# Patient Record
Sex: Female | Born: 1960 | Race: White | Hispanic: No | Marital: Single | Smoking: Current every day smoker
Health system: Southern US, Community
[De-identification: ages and names within clinical notes are randomized; demographics above are authoritative.]

## PROBLEM LIST (undated history)

## (undated) DIAGNOSIS — R531 Weakness: Secondary | ICD-10-CM

## (undated) DIAGNOSIS — K219 Gastro-esophageal reflux disease without esophagitis: Secondary | ICD-10-CM

## (undated) DIAGNOSIS — E079 Disorder of thyroid, unspecified: Secondary | ICD-10-CM

## (undated) DIAGNOSIS — I10 Essential (primary) hypertension: Secondary | ICD-10-CM

## (undated) DIAGNOSIS — F32A Depression, unspecified: Secondary | ICD-10-CM

## (undated) DIAGNOSIS — Z8639 Personal history of other endocrine, nutritional and metabolic disease: Secondary | ICD-10-CM

## (undated) DIAGNOSIS — G5601 Carpal tunnel syndrome, right upper limb: Secondary | ICD-10-CM

## (undated) DIAGNOSIS — G510 Bell's palsy: Secondary | ICD-10-CM

## (undated) DIAGNOSIS — R079 Chest pain, unspecified: Secondary | ICD-10-CM

## (undated) DIAGNOSIS — I160 Hypertensive urgency: Secondary | ICD-10-CM

## (undated) DIAGNOSIS — J209 Acute bronchitis, unspecified: Secondary | ICD-10-CM

## (undated) DIAGNOSIS — M1990 Unspecified osteoarthritis, unspecified site: Secondary | ICD-10-CM

## (undated) DIAGNOSIS — E042 Nontoxic multinodular goiter: Secondary | ICD-10-CM

## (undated) DIAGNOSIS — R002 Palpitations: Secondary | ICD-10-CM

## (undated) DIAGNOSIS — Z720 Tobacco use: Secondary | ICD-10-CM

## (undated) DIAGNOSIS — E039 Hypothyroidism, unspecified: Secondary | ICD-10-CM

## (undated) DIAGNOSIS — Z98811 Dental restoration status: Secondary | ICD-10-CM

## (undated) DIAGNOSIS — F329 Major depressive disorder, single episode, unspecified: Secondary | ICD-10-CM

## (undated) DIAGNOSIS — E0590 Thyrotoxicosis, unspecified without thyrotoxic crisis or storm: Secondary | ICD-10-CM

## (undated) HISTORY — DX: Essential (primary) hypertension: I10

## (undated) HISTORY — PX: APPENDECTOMY: SHX54

## (undated) HISTORY — DX: Acute bronchitis, unspecified: J20.9

## (undated) HISTORY — PX: HERNIA REPAIR: SHX51

## (undated) HISTORY — DX: Chest pain, unspecified: R07.9

## (undated) HISTORY — DX: Nontoxic multinodular goiter: E04.2

## (undated) HISTORY — PX: KNEE ARTHROSCOPY: SUR90

## (undated) HISTORY — DX: Hypothyroidism, unspecified: E03.9

## (undated) HISTORY — DX: Thyrotoxicosis, unspecified without thyrotoxic crisis or storm: E05.90

## (undated) HISTORY — DX: Palpitations: R00.2

## (undated) HISTORY — PX: INGUINAL HERNIA REPAIR: SHX194

## (undated) HISTORY — DX: Hypertensive urgency: I16.0

## (undated) HISTORY — DX: Weakness: R53.1

## (undated) HISTORY — DX: Bell's palsy: G51.0

---

## 1998-12-25 ENCOUNTER — Other Ambulatory Visit: Admission: RE | Admit: 1998-12-25 | Discharge: 1998-12-25 | Payer: Self-pay | Admitting: *Deleted

## 1999-04-11 ENCOUNTER — Encounter: Admission: RE | Admit: 1999-04-11 | Discharge: 1999-04-11 | Payer: Self-pay | Admitting: Gastroenterology

## 1999-04-11 ENCOUNTER — Encounter: Payer: Self-pay | Admitting: Gastroenterology

## 2001-05-01 ENCOUNTER — Encounter: Payer: Self-pay | Admitting: *Deleted

## 2001-05-01 ENCOUNTER — Emergency Department (HOSPITAL_COMMUNITY): Admission: EM | Admit: 2001-05-01 | Discharge: 2001-05-01 | Payer: Self-pay | Admitting: *Deleted

## 2001-06-24 ENCOUNTER — Ambulatory Visit (HOSPITAL_COMMUNITY): Admission: RE | Admit: 2001-06-24 | Discharge: 2001-06-24 | Payer: Self-pay | Admitting: Gastroenterology

## 2001-12-01 ENCOUNTER — Emergency Department (HOSPITAL_COMMUNITY): Admission: EM | Admit: 2001-12-01 | Discharge: 2001-12-01 | Payer: Self-pay | Admitting: Emergency Medicine

## 2001-12-13 ENCOUNTER — Other Ambulatory Visit: Admission: RE | Admit: 2001-12-13 | Discharge: 2001-12-13 | Payer: Self-pay | Admitting: Obstetrics and Gynecology

## 2003-04-20 ENCOUNTER — Other Ambulatory Visit: Admission: RE | Admit: 2003-04-20 | Discharge: 2003-04-20 | Payer: Self-pay | Admitting: Obstetrics and Gynecology

## 2004-05-01 ENCOUNTER — Ambulatory Visit: Payer: Self-pay | Admitting: Hematology & Oncology

## 2004-07-24 ENCOUNTER — Ambulatory Visit: Payer: Self-pay | Admitting: Hematology & Oncology

## 2005-05-14 ENCOUNTER — Encounter: Admission: RE | Admit: 2005-05-14 | Discharge: 2005-05-14 | Payer: Self-pay | Admitting: Family Medicine

## 2006-01-27 ENCOUNTER — Emergency Department (HOSPITAL_COMMUNITY): Admission: EM | Admit: 2006-01-27 | Discharge: 2006-01-27 | Payer: Self-pay | Admitting: Emergency Medicine

## 2007-07-07 ENCOUNTER — Emergency Department (HOSPITAL_COMMUNITY): Admission: EM | Admit: 2007-07-07 | Discharge: 2007-07-07 | Payer: Self-pay | Admitting: Emergency Medicine

## 2007-08-22 ENCOUNTER — Emergency Department (HOSPITAL_COMMUNITY): Admission: EM | Admit: 2007-08-22 | Discharge: 2007-08-22 | Payer: Self-pay | Admitting: Emergency Medicine

## 2007-08-23 ENCOUNTER — Encounter: Payer: Self-pay | Admitting: Endocrinology

## 2007-09-05 ENCOUNTER — Ambulatory Visit: Payer: Self-pay | Admitting: Endocrinology

## 2007-09-05 DIAGNOSIS — E0590 Thyrotoxicosis, unspecified without thyrotoxic crisis or storm: Secondary | ICD-10-CM

## 2007-09-05 DIAGNOSIS — E042 Nontoxic multinodular goiter: Secondary | ICD-10-CM

## 2007-09-05 HISTORY — DX: Nontoxic multinodular goiter: E04.2

## 2007-09-05 HISTORY — DX: Thyrotoxicosis, unspecified without thyrotoxic crisis or storm: E05.90

## 2007-09-19 ENCOUNTER — Encounter: Admission: RE | Admit: 2007-09-19 | Discharge: 2007-09-19 | Payer: Self-pay | Admitting: Endocrinology

## 2008-01-09 ENCOUNTER — Ambulatory Visit (HOSPITAL_COMMUNITY): Admission: RE | Admit: 2008-01-09 | Discharge: 2008-01-09 | Payer: Self-pay | Admitting: Internal Medicine

## 2008-08-14 ENCOUNTER — Encounter: Admission: RE | Admit: 2008-08-14 | Discharge: 2008-08-14 | Payer: Self-pay | Admitting: Orthopedic Surgery

## 2009-07-08 ENCOUNTER — Encounter (INDEPENDENT_AMBULATORY_CARE_PROVIDER_SITE_OTHER): Payer: Self-pay | Admitting: Emergency Medicine

## 2009-07-08 ENCOUNTER — Observation Stay (HOSPITAL_COMMUNITY): Admission: EM | Admit: 2009-07-08 | Discharge: 2009-07-08 | Payer: Self-pay | Admitting: Emergency Medicine

## 2009-07-08 ENCOUNTER — Ambulatory Visit: Payer: Self-pay | Admitting: Internal Medicine

## 2009-08-28 ENCOUNTER — Emergency Department (HOSPITAL_COMMUNITY): Admission: EM | Admit: 2009-08-28 | Discharge: 2009-08-28 | Payer: Self-pay | Admitting: Emergency Medicine

## 2010-03-16 ENCOUNTER — Encounter: Payer: Self-pay | Admitting: Internal Medicine

## 2010-05-11 LAB — BASIC METABOLIC PANEL
BUN: 12 mg/dL (ref 6–23)
Calcium: 8.9 mg/dL (ref 8.4–10.5)
Chloride: 105 mEq/L (ref 96–112)
Creatinine, Ser: 0.87 mg/dL (ref 0.4–1.2)
GFR calc non Af Amer: >60 mL/min (ref >60)

## 2010-05-11 LAB — CBC
MCV: 92.5 fL (ref 78.0–100.0)
Platelets: 332 10*3/uL (ref 150–400)
RDW: 13.9 % (ref 11.5–15.5)
WBC: 13.5 10*3/uL — ABNORMAL HIGH (ref 4.0–10.5)

## 2010-05-11 LAB — DIFFERENTIAL
Basophils Absolute: 0 10*3/uL (ref 0.0–0.1)
Eosinophils Relative: 1 % (ref 0–5)
Lymphocytes Relative: 4 % — ABNORMAL LOW (ref 12–46)

## 2010-05-11 LAB — POCT I-STAT, CHEM 8
BUN: 13 mg/dL (ref 6–23)
Hemoglobin: 15.6 g/dL — ABNORMAL HIGH (ref 12.0–15.0)
Sodium: 139 mEq/L (ref 135–145)
TCO2: 23 mmol/L (ref 0–100)

## 2010-05-11 LAB — POCT CARDIAC MARKERS: Myoglobin, poc: 89 ng/mL (ref 12–200)

## 2010-05-11 LAB — GLUCOSE, CAPILLARY: Glucose-Capillary: 109 mg/dL — ABNORMAL HIGH (ref 70–99)

## 2010-05-12 LAB — URINALYSIS, ROUTINE W REFLEX MICROSCOPIC
Bilirubin Urine: NEGATIVE
Hgb urine dipstick: NEGATIVE
Ketones, ur: NEGATIVE mg/dL
Nitrite: POSITIVE — AB
pH: 6 (ref 5.0–8.0)

## 2010-05-12 LAB — COMPREHENSIVE METABOLIC PANEL
ALT: 17 U/L (ref 0–35)
Alkaline Phosphatase: 65 U/L (ref 39–117)
CO2: 22 mEq/L (ref 19–32)
GFR calc non Af Amer: >60 mL/min (ref >60)
Glucose, Bld: 110 mg/dL — ABNORMAL HIGH (ref 70–99)
Potassium: 3.1 mEq/L — ABNORMAL LOW (ref 3.5–5.1)
Sodium: 135 mEq/L (ref 135–145)

## 2010-05-12 LAB — CBC
HCT: 40.9 % (ref 36.0–46.0)
Hemoglobin: 14.3 g/dL (ref 12.0–15.0)
MCHC: 34.9 g/dL (ref 30.0–36.0)
MCV: 91.8 fL (ref 78.0–100.0)
Platelets: 396 K/uL (ref 150–400)
RBC: 4.46 MIL/uL (ref 3.87–5.11)
RDW: 13.9 % (ref 11.5–15.5)
WBC: 10.9 K/uL — ABNORMAL HIGH (ref 4.0–10.5)

## 2010-05-12 LAB — DIFFERENTIAL
Basophils Relative: 1 % (ref 0–1)
Eosinophils Absolute: 0.3 10*3/uL (ref 0.0–0.7)
Eosinophils Relative: 3 % (ref 0–5)
Monocytes Relative: 6 % (ref 3–12)
Neutrophils Relative %: 66 % (ref 43–77)

## 2010-05-12 LAB — URINE CULTURE
Colony Count: NO GROWTH
Culture: NO GROWTH

## 2010-05-12 LAB — TROPONIN I
Troponin I: 0.01 ng/mL (ref 0.00–0.06)
Troponin I: 0.04 ng/mL (ref 0.00–0.06)

## 2010-05-12 LAB — CK TOTAL AND CKMB (NOT AT ARMC)
CK, MB: 1.1 ng/mL (ref 0.3–4.0)
Relative Index: INVALID (ref 0.0–2.5)
Total CK: 43 U/L (ref 7–177)
Total CK: 63 U/L (ref 7–177)

## 2010-05-12 LAB — LIPASE, BLOOD: Lipase: 31 U/L (ref 11–59)

## 2010-05-12 LAB — URINE MICROSCOPIC-ADD ON

## 2010-07-11 NOTE — Procedures (Signed)
[place]  Patient:    Sheri Villarreal, Sheri Villarreal Visit Number: [id number] MRN: [id number]          Service Type: END Location: ENDO Attending Physician:  Nelda Marseille Dictated by:   Petra Kuba, M.D. Proc. Date: 06/24/01 Admit Date:  06/24/2001 Discharge Date: 06/24/2001   CC:         Jamesetta Geralds, M.D.   Procedure Report  PROCEDURE:  Esophagogastroduodenoscopy.  INDICATION:  Upper tract symptoms.  Consent was signed after risks, benefits, methods, and options thoroughly discussed in the office.  MEDICATIONS:  Demerol 60 mg, Versed 7.5 mg.  DESCRIPTION OF PROCEDURE:  The video endoscope was inserted by direct vision. The esophagus was normal.  In the distal esophagus was a tiny, small hiatal hernia.  The scope passed into the stomach and advanced through a normal antrum and a normal pylorus into a normal duodenal bulb and around the C-loop to a normal second portion of the duodenum.  The scope was withdrawn back to the bulb, and a good look there ruled out ulcers in that location.  The scope was withdrawn back to the stomach and retroflexed.  Cardia, fundus, angularis, lesser and greater curve were normal.  The scope was straightened.  Straight visualization of the stomach was normal.  The scope was then slowly withdrawn. Again a good look at the esophagus was normal without signs of Barretts or esophagitis.  The patient tolerated the procedure well.  There was no obvious immediate complication.  ENDOSCOPIC DIAGNOSES: 1. Tiny to small hiatal hernia. 2. Otherwise within normal limits EGD.  PLAN: 1. Continue Nexium since that seems to be helping. 2. Follow up p.r.n. or in two three months to recheck symptoms and make sure    no other plans are needed. Dictated by:   Petra Kuba, M.D. Attending Physician:  Nelda Marseille DD:  06/24/01 TD:  06/27/01 Job: [id number]

## 2010-11-19 LAB — D-DIMER, QUANTITATIVE: D-Dimer, Quant: 0.36

## 2010-11-19 LAB — POCT I-STAT, CHEM 8
BUN: 6
Calcium, Ion: 1.15
Creatinine, Ser: 0.6
Glucose, Bld: 94
Sodium: 140
TCO2: 26

## 2010-11-19 LAB — POCT CARDIAC MARKERS: Myoglobin, poc: 56

## 2010-11-20 LAB — COMPREHENSIVE METABOLIC PANEL
Albumin: 3.3 — ABNORMAL LOW
BUN: 6
Creatinine, Ser: 0.56
Potassium: 3.2 — ABNORMAL LOW
Total Protein: 6.2

## 2010-11-20 LAB — POCT CARDIAC MARKERS
CKMB, poc: <1 — ABNORMAL LOW
Myoglobin, poc: 51.7
Myoglobin, poc: 56.7
Operator id: 277751
Troponin i, poc: <0.05

## 2010-11-20 LAB — TSH: TSH: 0.008 — ABNORMAL LOW

## 2010-11-20 LAB — CBC
HCT: 38.7
Platelets: 367
RDW: 14.4

## 2010-11-20 LAB — DIFFERENTIAL
Lymphocytes Relative: 11 — ABNORMAL LOW
Monocytes Absolute: 0.9
Monocytes Relative: 6
Neutro Abs: 11.2 — ABNORMAL HIGH

## 2010-11-20 LAB — MAGNESIUM: Magnesium: 1.8

## 2013-06-09 ENCOUNTER — Other Ambulatory Visit: Payer: Self-pay | Admitting: Orthopedic Surgery

## 2013-06-23 DIAGNOSIS — G5601 Carpal tunnel syndrome, right upper limb: Secondary | ICD-10-CM

## 2013-06-23 HISTORY — DX: Carpal tunnel syndrome, right upper limb: G56.01

## 2013-06-30 ENCOUNTER — Encounter (HOSPITAL_BASED_OUTPATIENT_CLINIC_OR_DEPARTMENT_OTHER): Payer: Self-pay | Admitting: *Deleted

## 2013-06-30 ENCOUNTER — Encounter (HOSPITAL_BASED_OUTPATIENT_CLINIC_OR_DEPARTMENT_OTHER)
Admission: RE | Admit: 2013-06-30 | Discharge: 2013-06-30 | Disposition: A | Discharge status: Home / Self Care | Payer: 59 | Source: Ambulatory Visit | Attending: Orthopedic Surgery | Admitting: Orthopedic Surgery

## 2013-06-30 LAB — BASIC METABOLIC PANEL
BUN: 16 mg/dL (ref 6–23)
CALCIUM: 8.7 mg/dL (ref 8.4–10.5)
CO2: 24 mEq/L (ref 19–32)
Chloride: 102 mEq/L (ref 96–112)
Creatinine, Ser: 0.8 mg/dL (ref 0.50–1.10)
GFR calc non Af Amer: 82 mL/min — ABNORMAL LOW (ref >90)
Glucose, Bld: 105 mg/dL — ABNORMAL HIGH (ref 70–99)
Potassium: 3.8 mEq/L (ref 3.7–5.3)
SODIUM: 140 meq/L (ref 137–147)

## 2013-06-30 NOTE — Pre-Procedure Instructions (Addendum)
EKG req. from Dr. Drema Dallas' office To come for BMET

## 2013-07-04 ENCOUNTER — Encounter (HOSPITAL_BASED_OUTPATIENT_CLINIC_OR_DEPARTMENT_OTHER)
Admission: RE | Disposition: A | Discharge status: Home / Self Care | Payer: Self-pay | Source: Ambulatory Visit | Attending: Orthopedic Surgery

## 2013-07-04 ENCOUNTER — Ambulatory Visit (HOSPITAL_BASED_OUTPATIENT_CLINIC_OR_DEPARTMENT_OTHER)
Admission: RE | Admit: 2013-07-04 | Discharge: 2013-07-04 | Disposition: A | Discharge status: Home / Self Care | Payer: 59 | Source: Ambulatory Visit | Attending: Orthopedic Surgery | Admitting: Orthopedic Surgery

## 2013-07-04 ENCOUNTER — Ambulatory Visit (HOSPITAL_BASED_OUTPATIENT_CLINIC_OR_DEPARTMENT_OTHER): Payer: 59 | Admitting: Certified Registered"

## 2013-07-04 ENCOUNTER — Encounter (HOSPITAL_BASED_OUTPATIENT_CLINIC_OR_DEPARTMENT_OTHER): Payer: Self-pay | Admitting: Orthopedic Surgery

## 2013-07-04 ENCOUNTER — Encounter (HOSPITAL_BASED_OUTPATIENT_CLINIC_OR_DEPARTMENT_OTHER): Payer: 59 | Admitting: Certified Registered"

## 2013-07-04 DIAGNOSIS — M129 Arthropathy, unspecified: Secondary | ICD-10-CM | POA: Insufficient documentation

## 2013-07-04 DIAGNOSIS — G5600 Carpal tunnel syndrome, unspecified upper limb: Secondary | ICD-10-CM | POA: Insufficient documentation

## 2013-07-04 DIAGNOSIS — E0590 Thyrotoxicosis, unspecified without thyrotoxic crisis or storm: Secondary | ICD-10-CM | POA: Insufficient documentation

## 2013-07-04 DIAGNOSIS — I10 Essential (primary) hypertension: Secondary | ICD-10-CM | POA: Insufficient documentation

## 2013-07-04 DIAGNOSIS — F17200 Nicotine dependence, unspecified, uncomplicated: Secondary | ICD-10-CM | POA: Insufficient documentation

## 2013-07-04 DIAGNOSIS — Z6836 Body mass index (BMI) 36.0-36.9, adult: Secondary | ICD-10-CM | POA: Insufficient documentation

## 2013-07-04 DIAGNOSIS — Z79899 Other long term (current) drug therapy: Secondary | ICD-10-CM | POA: Insufficient documentation

## 2013-07-04 DIAGNOSIS — K219 Gastro-esophageal reflux disease without esophagitis: Secondary | ICD-10-CM | POA: Insufficient documentation

## 2013-07-04 HISTORY — DX: Carpal tunnel syndrome, right upper limb: G56.01

## 2013-07-04 HISTORY — PX: CARPAL TUNNEL RELEASE: SHX101

## 2013-07-04 HISTORY — DX: Essential (primary) hypertension: I10

## 2013-07-04 HISTORY — DX: Depression, unspecified: F32.A

## 2013-07-04 HISTORY — DX: Unspecified osteoarthritis, unspecified site: M19.90

## 2013-07-04 HISTORY — DX: Dental restoration status: Z98.811

## 2013-07-04 HISTORY — DX: Gastro-esophageal reflux disease without esophagitis: K21.9

## 2013-07-04 HISTORY — DX: Personal history of other endocrine, nutritional and metabolic disease: Z86.39

## 2013-07-04 HISTORY — DX: Major depressive disorder, single episode, unspecified: F32.9

## 2013-07-04 LAB — POCT HEMOGLOBIN-HEMACUE: Hemoglobin: 14.3 g/dL (ref 12.0–15.0)

## 2013-07-04 SURGERY — CARPAL TUNNEL RELEASE
Anesthesia: Monitor Anesthesia Care | Site: Wrist | Laterality: Right

## 2013-07-04 MED ORDER — LACTATED RINGERS IV SOLN
INTRAVENOUS | Status: DC
  Administered 2013-07-04: 09:00:00 via INTRAVENOUS

## 2013-07-04 MED ORDER — PROPOFOL INFUSION 10 MG/ML OPTIME
INTRAVENOUS | Status: DC | PRN
  Administered 2013-07-04: 75 ug/kg/min via INTRAVENOUS

## 2013-07-04 MED ORDER — FENTANYL CITRATE 0.05 MG/ML IJ SOLN
INTRAMUSCULAR | Status: AC
  Filled 2013-07-04: qty 4

## 2013-07-04 MED ORDER — CEFAZOLIN SODIUM-DEXTROSE 2-3 GM-% IV SOLR: 2.0000 g | INTRAVENOUS | Status: DC

## 2013-07-04 MED ORDER — BUPIVACAINE HCL (PF) 0.25 % IJ SOLN
INTRAMUSCULAR | Status: DC | PRN
  Administered 2013-07-04: 7 mL

## 2013-07-04 MED ORDER — LIDOCAINE HCL (CARDIAC) 20 MG/ML IV SOLN
INTRAVENOUS | Status: DC | PRN
  Administered 2013-07-04: 30 mg via INTRAVENOUS

## 2013-07-04 MED ORDER — CHLORHEXIDINE GLUCONATE 4 % EX LIQD: 60.0000 mL | Freq: Once | CUTANEOUS | Status: DC

## 2013-07-04 MED ORDER — HYDROCODONE-ACETAMINOPHEN 5-325 MG PO TABS: 1.0000 | ORAL_TABLET | Freq: Four times a day (QID) | ORAL | Status: DC | PRN

## 2013-07-04 MED ORDER — MIDAZOLAM HCL 2 MG/2ML IJ SOLN
INTRAMUSCULAR | Status: AC
  Filled 2013-07-04: qty 2

## 2013-07-04 MED ORDER — OXYCODONE HCL 5 MG PO TABS: 5.0000 mg | ORAL_TABLET | Freq: Once | ORAL | Status: DC | PRN

## 2013-07-04 MED ORDER — ONDANSETRON HCL 4 MG/2ML IJ SOLN
4.0000 mg | Freq: Once | INTRAMUSCULAR | Status: DC | PRN
Start: 2013-07-04 — End: 2013-07-04

## 2013-07-04 MED ORDER — MIDAZOLAM HCL 5 MG/5ML IJ SOLN
INTRAMUSCULAR | Status: DC | PRN
  Administered 2013-07-04: 2 mg via INTRAVENOUS

## 2013-07-04 MED ORDER — LIDOCAINE HCL (PF) 0.5 % IJ SOLN
INTRAMUSCULAR | Status: DC | PRN
  Administered 2013-07-04: 30 mL via INTRAVENOUS

## 2013-07-04 MED ORDER — HYDROMORPHONE HCL PF 1 MG/ML IJ SOLN
0.2500 mg | INTRAMUSCULAR | Status: DC | PRN
Start: 2013-07-04 — End: 2013-07-04

## 2013-07-04 MED ORDER — FENTANYL CITRATE 0.05 MG/ML IJ SOLN: 50.0000 ug | INTRAMUSCULAR | Status: DC | PRN

## 2013-07-04 MED ORDER — OXYCODONE HCL 5 MG/5ML PO SOLN: 5.0000 mg | Freq: Once | ORAL | Status: DC | PRN

## 2013-07-04 MED ORDER — MIDAZOLAM HCL 2 MG/2ML IJ SOLN: 1.0000 mg | INTRAMUSCULAR | Status: DC | PRN

## 2013-07-04 MED ORDER — CEFAZOLIN SODIUM-DEXTROSE 2-3 GM-% IV SOLR
2.0000 g | INTRAVENOUS | Status: AC
  Administered 2013-07-04: 2 g via INTRAVENOUS

## 2013-07-04 MED ORDER — FENTANYL CITRATE 0.05 MG/ML IJ SOLN
INTRAMUSCULAR | Status: DC | PRN
Start: 2013-07-04 — End: 2013-07-04
  Administered 2013-07-04: 50 ug via INTRAVENOUS

## 2013-07-04 MED ORDER — CEFAZOLIN SODIUM-DEXTROSE 2-3 GM-% IV SOLR
INTRAVENOUS | Status: AC
  Filled 2013-07-04: qty 50

## 2013-07-04 MED ORDER — ONDANSETRON HCL 4 MG/2ML IJ SOLN
INTRAMUSCULAR | Status: DC | PRN
  Administered 2013-07-04: 4 mg via INTRAVENOUS

## 2013-07-04 MED ORDER — MIDAZOLAM HCL 2 MG/ML PO SYRP: 12.0000 mg | ORAL_SOLUTION | Freq: Once | ORAL | Status: DC | PRN

## 2013-07-04 SURGICAL SUPPLY — 39 items
BLADE 15 SAFETY STRL DISP (BLADE) IMPLANT
BLADE SURG 15 STRL LF DISP TIS (BLADE) ×1 IMPLANT
BLADE SURG 15 STRL SS (BLADE) ×1
BNDG COHESIVE 3X5 TAN STRL LF (GAUZE/BANDAGES/DRESSINGS) ×2 IMPLANT
BNDG ESMARK 4X9 LF (GAUZE/BANDAGES/DRESSINGS) IMPLANT
BNDG GAUZE ELAST 4 BULKY (GAUZE/BANDAGES/DRESSINGS) ×2 IMPLANT
CHLORAPREP W/TINT 26ML (MISCELLANEOUS) ×2 IMPLANT
CORDS BIPOLAR (ELECTRODE) ×2 IMPLANT
COVER MAYO STAND STRL (DRAPES) ×2 IMPLANT
COVER TABLE BACK 60X90 (DRAPES) ×2 IMPLANT
CUFF TOURNIQUET SINGLE 18IN (TOURNIQUET CUFF) ×2 IMPLANT
DRAPE EXTREMITY T 121X128X90 (DRAPE) ×2 IMPLANT
DRAPE SURG 17X23 STRL (DRAPES) ×2 IMPLANT
DRSG PAD ABDOMINAL 8X10 ST (GAUZE/BANDAGES/DRESSINGS) ×2 IMPLANT
GAUZE SPONGE 4X4 12PLY STRL (GAUZE/BANDAGES/DRESSINGS) ×2 IMPLANT
GAUZE XEROFORM 1X8 LF (GAUZE/BANDAGES/DRESSINGS) ×2 IMPLANT
GLOVE BIO SURGEON STRL SZ7.5 (GLOVE) ×2 IMPLANT
GLOVE BIOGEL PI IND STRL 7.0 (GLOVE) ×1 IMPLANT
GLOVE BIOGEL PI IND STRL 8 (GLOVE) ×1 IMPLANT
GLOVE BIOGEL PI IND STRL 8.5 (GLOVE) ×1 IMPLANT
GLOVE BIOGEL PI INDICATOR 7.0 (GLOVE) ×1
GLOVE BIOGEL PI INDICATOR 8 (GLOVE) ×1
GLOVE BIOGEL PI INDICATOR 8.5 (GLOVE) ×1
GLOVE ECLIPSE 6.5 STRL STRAW (GLOVE) ×2 IMPLANT
GLOVE EXAM NITRILE EXT CUFF MD (GLOVE) ×2 IMPLANT
GLOVE SURG ORTHO 8.0 STRL STRW (GLOVE) ×2 IMPLANT
GOWN STRL REUS W/ TWL LRG LVL3 (GOWN DISPOSABLE) ×1 IMPLANT
GOWN STRL REUS W/TWL LRG LVL3 (GOWN DISPOSABLE) ×1
GOWN STRL REUS W/TWL XL LVL3 (GOWN DISPOSABLE) ×4 IMPLANT
NEEDLE 27GAX1X1/2 (NEEDLE) ×2 IMPLANT
NS IRRIG 1000ML POUR BTL (IV SOLUTION) ×2 IMPLANT
PACK BASIN DAY SURGERY FS (CUSTOM PROCEDURE TRAY) ×2 IMPLANT
STOCKINETTE 4X48 STRL (DRAPES) ×2 IMPLANT
SUT VICRYL 4-0 PS2 18IN ABS (SUTURE) IMPLANT
SUT VICRYL RAPIDE 4/0 PS 2 (SUTURE) ×2 IMPLANT
SYR BULB 3OZ (MISCELLANEOUS) ×2 IMPLANT
SYR CONTROL 10ML LL (SYRINGE) ×2 IMPLANT
TOWEL OR 17X24 6PK STRL BLUE (TOWEL DISPOSABLE) ×2 IMPLANT
UNDERPAD 30X30 INCONTINENT (UNDERPADS AND DIAPERS) ×2 IMPLANT

## 2013-07-04 NOTE — Brief Op Note (Signed)
07/04/2013  10:12 AM  PATIENT:  Sheri Villarreal  53 y.o. female  PRE-OPERATIVE DIAGNOSIS:  RIGHT CARPAL TUNNEL SYNDROME  POST-OPERATIVE DIAGNOSIS:  Right Carpal Tunnel Syndrome  PROCEDURE:  Procedure(s): RIGHT CARPAL TUNNEL RELEASE (Right)  SURGEON:  Surgeon(s) and Role:    * Wynonia Sours, MD - Primary    * Tennis Must, MD - Assisting  PHYSICIAN ASSISTANT:   ASSISTANTS: K Karey Suthers,MD   ANESTHESIA:   local and regional  EBL:  Total I/O In: 600 [I.V.:600] Out: -   BLOOD ADMINISTERED:none  DRAINS: none   LOCAL MEDICATIONS USED:  BUPIVICAINE   SPECIMEN:  No Specimen  DISPOSITION OF SPECIMEN:  N/A  COUNTS:  YES  TOURNIQUET:   Total Tourniquet Time Documented: Forearm (Right) - 18 minutes Total: Forearm (Right) - 18 minutes   DICTATION: .Other Dictation: Dictation Number [id number]  PLAN OF CARE: Discharge to home after PACU  PATIENT DISPOSITION:  PACU - hemodynamically stable.

## 2013-07-04 NOTE — Anesthesia Procedure Notes (Addendum)
Procedure Name: MAC Date/Time: 07/04/2013 9:35 AM Performed by: Everett Ricciardelli Pre-anesthesia Checklist: Patient identified, Emergency Drugs available, Suction available, Patient being monitored and Timeout performed Patient Re-evaluated:Patient Re-evaluated prior to inductionOxygen Delivery Method: Simple face mask   Anesthesia Regional Block:  Bier block (IV Regional)  Pre-Anesthetic Checklist: ,, timeout performed, Correct Patient, Correct Site, Correct Laterality, Correct Procedure,, site marked, surgical consent,, at surgeon's request Needles:  Injection technique: Single-shot  Needle Type: Other      Needle Gauge: 20 and 20 G    Additional Needles: Bier block (IV Regional) Narrative:   Performed by: Personally

## 2013-07-04 NOTE — Anesthesia Postprocedure Evaluation (Signed)
  Anesthesia Post-op Note  Patient: Sheri Villarreal  Procedure(s) Performed: Procedure(s): RIGHT CARPAL TUNNEL RELEASE (Right)  Patient Location: PACU  Anesthesia Type:MAC and Bier block  Level of Consciousness: awake, alert  and oriented  Airway and Oxygen Therapy: Patient Spontanous Breathing  Post-op Pain: none  Post-op Assessment: Post-op Vital signs reviewed  Post-op Vital Signs: Reviewed  Last Vitals:  Filed Vitals:   07/04/13 1100  BP: 159/84  Pulse: 64  Temp: 36.5 C  Resp: 14    Complications: No apparent anesthesia complications

## 2013-07-04 NOTE — Discharge Instructions (Signed)

## 2013-07-04 NOTE — Anesthesia Preprocedure Evaluation (Addendum)
Anesthesia Evaluation  Patient identified by MRN, date of birth, ID band Patient awake    Reviewed: Allergy & Precautions, H&P , NPO status , Patient's Chart, lab work & pertinent test results  Airway Mallampati: I TM Distance: >3 FB Neck ROM: Full    Dental  (+) Teeth Intact, Dental Advisory Given   Pulmonary Current Smoker,  breath sounds clear to auscultation        Cardiovascular hypertension, Pt. on medications Rhythm:Regular Rate:Normal     Neuro/Psych    GI/Hepatic GERD-  Medicated and Controlled,  Endo/Other  Morbid obesity  Renal/GU      Musculoskeletal   Abdominal   Peds  Hematology   Anesthesia Other Findings   Reproductive/Obstetrics                          Anesthesia Physical Anesthesia Plan  ASA: II  Anesthesia Plan: Bier Block and MAC   Post-op Pain Management:    Induction: Intravenous  Airway Management Planned: Simple Face Mask  Additional Equipment:   Intra-op Plan:   Post-operative Plan:   Informed Consent: I have reviewed the patients History and Physical, chart, labs and discussed the procedure including the risks, benefits and alternatives for the proposed anesthesia with the patient or authorized representative who has indicated his/her understanding and acceptance.   Dental advisory given  Plan Discussed with: CRNA, Anesthesiologist and Surgeon  Anesthesia Plan Comments:         Anesthesia Quick Evaluation

## 2013-07-04 NOTE — H&P (Signed)
Sheri Villarreal is a 53 year-old right-hand dominant female who comes in complaining of numbness and tingling in both hands, right greater than left.  This has been going on for approximately two months.  She takes diclofenac for arthritis elsewhere. She has no history of injury to the hand or neck, this awakens her at night only on an occasional basis, but frequently requires shaking her hands to get rid of the numbness and tingling when it occurs. She complains of an intermittent, mild to stabbing pain with a feeling of numbness to the thumb through ring fingers.  She states it is gradually getting worse.  Activity makes it worse, gripping seems to aggravate this.  She has history of thyroid problems, arthritis, no history of gout or diabetes. There is family history of diabetes and arthritis.    ALLERGIES:   No known allergies.  MEDICATIONS:     Losartan, potassium, HCTZ, Synthroid, Wellbutrin, Lexapro, omeprazole and diclofenac.  SURGICAL HISTORY:     Appendectomy, hernia repair and arthroscopic knee surgery.  FAMILY MEDICAL HISTORY:     Positive for diabetes, high blood pressure and arthritis.  SOCIAL HISTORY:     She smokes less than half pack a day and is advised to quit with the reasons behind this.  She is actually trying.  She drinks socially.  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    REVIEW OF SYSTEMS:    Positive for glasses, contacts, high blood pressure and otherwise negative 14 points.   Sheri Villarreal is an 53 y.o. female.   Chief Complaint: CTS Right HPI: see above  Past Medical History  Diagnosis Date  . GERD (gastroesophageal reflux disease)   . History of hyperthyroidism   . Depression   . Carpal tunnel syndrome of right wrist 06/2013  . Hypertension     under control with med., has been on med. x 9 yr.  . Dental crowns present   . Arthritis     knees    Past Surgical History  Procedure Laterality Date  . Appendectomy    .  Inguinal hernia repair    . Knee arthroscopy Right     History reviewed. No pertinent family history. Social History:  reports that she has been smoking Cigarettes.  She has been smoking about 0.00 packs per day for the past 30 years. She has never used smokeless tobacco. She reports that she drinks alcohol. She reports that she does not use illicit drugs.  Allergies: No Known Allergies  No prescriptions prior to admission    No results found for this or any previous visit (from the past 48 hour(s)).  No results found.   Pertinent items are noted in HPI.  Height 5\' 4"  (1.626 m), weight 200 lb (90.719 kg).  General appearance: alert, cooperative and appears stated age Head: Normocephalic, without obvious abnormality Neck: no JVD Resp: clear to auscultation bilaterally Cardio: regular rate and rhythm, S1, S2 normal, no murmur, click, rub or gallop GI: soft, non-tender; bowel sounds normal; no masses,  no organomegaly Extremities: extremities normal, atraumatic, no cyanosis or edema Pulses: 2+ and symmetric Skin: Skin color, texture, turgor normal. No rashes or lesions Neurologic: Grossly normal Incision/Wound: na  Assessment/Plan RADIOGRAPHS:   X-rays of her hand reveal mild degenerative changes especially at the IP joints of her thumb, otherwise negative.  DIAGNOSIS:     Probable carpal tunnel syndrome with degenerative arthritis.    NERVE STUDIES:   Nerve conductions  were performed by Dr. Zebedee Iba today with a motor delay of 4.5 on the left and 5.5/right with sensory delay 2.7/left and 3.0/right, amplitude diminution to 49.7/left and 30.0/right.  RECOMMENDATIONS/PLAN:    We have discussed with her the possibility of injections, anti-inflammatories, splinting, surgical intervention. She would like to have this operated on.  She is aware there is no guarantee with the surgery, possibility of infection, recurrence, injury to arteries, nerves, tendons, incomplete relief of  symptoms and dystrophy.  She is scheduled for right carpal tunnel release as outpatient under regional anesthesia.  Wynonia Sours 07/04/2013, 7:53 AM

## 2013-07-04 NOTE — Op Note (Signed)
Dictation Number [id number]

## 2013-07-04 NOTE — Transfer of Care (Signed)
Immediate Anesthesia Transfer of Care Note  Patient: Sheri Villarreal  Procedure(s) Performed: Procedure(s): RIGHT CARPAL TUNNEL RELEASE (Right)  Patient Location: PACU  Anesthesia Type:Bier block  Level of Consciousness: awake, alert , oriented and patient cooperative  Airway & Oxygen Therapy: Patient Spontanous Breathing  Post-op Assessment: Report given to PACU RN and Post -op Vital signs reviewed and stable  Post vital signs: Reviewed and stable  Complications: No apparent anesthesia complications

## 2013-07-06 ENCOUNTER — Encounter (HOSPITAL_BASED_OUTPATIENT_CLINIC_OR_DEPARTMENT_OTHER): Payer: Self-pay | Admitting: Orthopedic Surgery

## 2013-07-06 NOTE — Op Note (Signed)
NAMESKYLEIGH, [name] NO.:  [id number]  MEDICAL RECORD NO.:  [id number]  LOCATION:                                 FACILITY:  PHYSICIAN:  Daryll Brod, M.D.            DATE OF BIRTH:  DATE OF PROCEDURE:  07/04/2013 DATE OF DISCHARGE:                              OPERATIVE REPORT   PREOPERATIVE DIAGNOSIS:  Carpal tunnel syndrome, right hand.  POSTOPERATIVE DIAGNOSIS:  Carpal tunnel syndrome, right hand.  OPERATION:  Decompression right median nerve.  SURGEON:  Daryll Brod, MD  ASSISTANT:  Leanora Cover, MD  ANESTHESIA:  Forearm-based IV regional with local infiltration.  ANESTHESIOLOGIST:  Lorrene Reid, MD  HISTORY:  The patient is a 53 year old female with a history of carpal tunnel syndrome.  EMG, nerve conduction is positive, nonresponsive to conservative treatment.  She has elected to undergo surgical decompression to the median nerve.  Pre, peri, and postoperative course have been discussed along with risks and complications.  She is aware there is no guarantee with the surgery possibility of infection, recurrence of injury to arteries, nerves, tendons, incomplete relief of symptoms, and dystrophy.  In the preoperative area, the patient is seen, the extremity marked by both patient and surgeon.  Antibiotic given.  PROCEDURE IN DETAIL:  The patient was brought to the operating room, where a forearm-based IV regional anesthetic was carried out without difficulty.  She was prepped using ChloraPrep, supine position, right arm free.  A 3-minute dry time was allowed.  Time-out taken confirming the patient and procedure.  A longitudinal incision was made in the right palm.  She had some feeling.  A local infiltration with 0.25% Marcaine without epinephrine was given, approximately 7 mL was used. The wound was deepened with blunt and sharp dissection.  Bleeders were electrocauterized with bipolar.  The palmar fascia was split, superficial palmar arch  identified.  Flexor tendon were identified. Retractors placed protecting the median nerve.  To the ulnar side of the median nerve, the carpal retinaculum was incised with sharp dissection. Right angle and Sewall retractor were placed between skin and forearm fascia.  The fascia released for approximately a centimeter and half proximal to the wrist crease under direct vision.  Canal was explored. Area of compression to the nerve was apparent.  Motor branch entered into muscle.  No further lesions were identified.  The wound was irrigated.  The skin then closed with 4-0 Vicryl Rapide sutures. Further infiltration with 0.25% Marcaine was given, total approximately 9 mL.  A sterile compressive dressing with the fingers free was applied.  On deflation of the tourniquet, all fingers immediately pinked.  She was taken to the recovery room for observation in satisfactory condition.  She will be discharged home to return to the [facility] in 1 week on Vicodin.          ______________________________ Daryll Brod, M.D.     GK/MEDQ  D:  07/04/2013  T:  07/05/2013  Job:  [id number]

## 2014-07-20 ENCOUNTER — Emergency Department (HOSPITAL_COMMUNITY)
Admission: EM | Admit: 2014-07-20 | Discharge: 2014-07-20 | Disposition: A | Discharge status: Home / Self Care | Payer: 59 | Source: Home / Self Care

## 2014-07-20 ENCOUNTER — Encounter (HOSPITAL_COMMUNITY): Payer: Self-pay | Admitting: Emergency Medicine

## 2014-07-20 DIAGNOSIS — J988 Other specified respiratory disorders: Secondary | ICD-10-CM

## 2014-07-20 DIAGNOSIS — R05 Cough: Secondary | ICD-10-CM | POA: Diagnosis not present

## 2014-07-20 DIAGNOSIS — R059 Cough, unspecified: Secondary | ICD-10-CM

## 2014-07-20 MED ORDER — METHYLPREDNISOLONE ACETATE 80 MG/ML IJ SUSP
INTRAMUSCULAR | Status: AC
  Filled 2014-07-20: qty 1

## 2014-07-20 MED ORDER — AMOXICILLIN-POT CLAVULANATE 875-125 MG PO TABS: 1.0000 | ORAL_TABLET | Freq: Two times a day (BID) | ORAL | Status: DC

## 2014-07-20 MED ORDER — METHYLPREDNISOLONE ACETATE 80 MG/ML IJ SUSP
80.0000 mg | Freq: Once | INTRAMUSCULAR | Status: AC
  Administered 2014-07-20: 80 mg via INTRAMUSCULAR

## 2014-07-20 MED ORDER — HYDROCODONE-HOMATROPINE 5-1.5 MG/5ML PO SYRP: 5.0000 mL | ORAL_SOLUTION | Freq: Four times a day (QID) | ORAL | Status: DC | PRN

## 2014-07-20 NOTE — ED Notes (Signed)
C/o cold sx onset Wednesday 5/25 Sx include fevers, HA, prod cough, chest tightness, chills Taking aspirin and ibup w/no relief Alert, no signs of acute distress

## 2014-07-20 NOTE — ED Provider Notes (Signed)
CSN: [id number]     Arrival date & time 07/20/14  1940 History   None    Chief Complaint  Patient presents with  . URI   (Consider location/radiation/quality/duration/timing/severity/associated sxs/prior Treatment) HPI Comments: Patient presents with cough-non-productive x 3 days, now with chest soreness and pain that began today. No dyspnea. No pain with inspiration. Pain with coughing and movement. No fever or chills. Mild "scratchy" throat is noted. No known exposures.   Patient is a 54 y.o. female presenting with URI. The history is provided by the patient.  URI Presenting symptoms: congestion, cough and fatigue   Presenting symptoms: no fever   Associated symptoms: no wheezing     Past Medical History  Diagnosis Date  . GERD (gastroesophageal reflux disease)   . History of hyperthyroidism   . Depression   . Carpal tunnel syndrome of right wrist 06/2013  . Hypertension     under control with med., has been on med. x 9 yr.  . Dental crowns present   . Arthritis     knees   Past Surgical History  Procedure Laterality Date  . Appendectomy    . Inguinal hernia repair    . Knee arthroscopy Right   . Carpal tunnel release Right 07/04/2013    Procedure: RIGHT CARPAL TUNNEL RELEASE;  Surgeon: Wynonia Sours, MD;  Location: [facility];  Service: Orthopedics;  Laterality: Right;   No family history on file. History  Substance Use Topics  . Smoking status: Current Every Day Smoker -- 30 years    Types: Cigarettes  . Smokeless tobacco: Never Used     Comment: 7 cig./day  . Alcohol Use: Yes     Comment: infrequently   OB History    No data available     Review of Systems  Constitutional: Positive for fatigue. Negative for fever and chills.  HENT: Positive for congestion.   Respiratory: Positive for cough and chest tightness. Negative for shortness of breath and wheezing.   Gastrointestinal: Negative.   Allergic/Immunologic: Negative.    Psychiatric/Behavioral: Negative.     Allergies  Review of patient's allergies indicates no known allergies.  Home Medications   Prior to Admission medications   Medication Sig Start Date End Date Taking? Authorizing Provider  buPROPion (WELLBUTRIN XL) 150 MG 24 hr tablet Take 150 mg by mouth daily.   Yes Historical Provider, MD  levothyroxine (SYNTHROID, LEVOTHROID) 150 MCG tablet Take 150 mcg by mouth daily before breakfast.   Yes Historical Provider, MD  losartan-hydrochlorothiazide (HYZAAR) 100-25 MG per tablet Take 1 tablet by mouth daily.   Yes Historical Provider, MD  amoxicillin-clavulanate (AUGMENTIN) 875-125 MG per tablet Take 1 tablet by mouth every 12 (twelve) hours. 07/20/14   Bjorn Pippin, PA-C  calcium carbonate (OS-CAL) 600 MG TABS tablet Take 600 mg by mouth 2 (two) times daily with a meal.    Historical Provider, MD  cholecalciferol (VITAMIN D) 1000 UNITS tablet Take 2,000 Units by mouth daily.    Historical Provider, MD  Coconut Oil 1000 MG CAPS Take 1,000 mg by mouth daily.    Historical Provider, MD  diclofenac (VOLTAREN) 75 MG EC tablet Take 75 mg by mouth 2 (two) times daily.    Historical Provider, MD  escitalopram (LEXAPRO) 10 MG tablet Take 10 mg by mouth daily.    Historical Provider, MD  glucosamine-chondroitin 500-400 MG tablet Take 1 tablet by mouth 3 (three) times daily.    Historical Provider, MD  HYDROcodone-acetaminophen [facility]) [phone]  MG per tablet Take 1 tablet by mouth every 6 (six) hours as needed for moderate pain. 07/04/13   Daryll Brod, MD  HYDROcodone-homatropine [facility]) 5-1.5 MG/5ML syrup Take 5 mLs by mouth every 6 (six) hours as needed for cough. 07/20/14   Bjorn Pippin, PA-C  Magnesium 250 MG TABS Take 250 mg by mouth daily.    Historical Provider, MD  Omega-3 Fatty Acids (FISH OIL) 1000 MG CAPS Take by mouth.    Historical Provider, MD  omeprazole (PRILOSEC) 20 MG capsule Take 20 mg by mouth daily.    Historical Provider, MD  Turmeric 500  MG CAPS Take 500 mg by mouth daily.    Historical Provider, MD   BP 183/95 mmHg  Pulse 99  Temp(Src) 99 F (37.2 C) (Oral)  Resp 20  SpO2 96% Physical Exam  Constitutional: She is oriented to person, place, and time. She appears well-developed and well-nourished. No distress.  HENT:  Head: Normocephalic and atraumatic.  Right Ear: External ear normal.  Left Ear: External ear normal.  Mouth/Throat: No oropharyngeal exudate.  Mild erythema to the oro-pharynx  Neck: Normal range of motion.  Cardiovascular: Normal rate and regular rhythm.   Pulmonary/Chest: Effort normal and breath sounds normal.  Lymphadenopathy:    She has no cervical adenopathy.  Neurological: She is alert and oriented to person, place, and time.  Skin: Skin is warm and dry. No rash noted. She is not diaphoretic.  Psychiatric: Her behavior is normal.  Nursing note and vitals reviewed.   ED Course  Procedures (including critical care time) Labs Review Labs Reviewed - No data to display  Imaging Review No results found.   MDM   1. Respiratory infection   2. Cough    EKG performed for completeness-Normal. Probable soreness with ongoing cough. Fevers up to 102. Treat with DepoMedrol 80 mg IM and Augmentin RX. Cough syrup, rest. F/U if worsens.     Bjorn Pippin, PA-C 07/20/14 2019

## 2014-07-20 NOTE — Discharge Instructions (Signed)
Cough, Adult  A cough is a reflex. It helps you clear your throat and airways. A cough can help heal your body. A cough can last 2 or 3 weeks (acute) or may last more than 8 weeks (chronic). Some common causes of a cough can include an infection, allergy, or a cold. HOME CARE  Only take medicine as told by your doctor.  If given, take your medicines (antibiotics) as told. Finish them even if you start to feel better.  Use a cold steam vaporizer or humidifier in your home. This can help loosen thick spit (secretions).  Sleep so you are almost sitting up (semi-upright). Use pillows to do this. This helps reduce coughing.  Rest as needed.  Stop smoking if you smoke. GET HELP RIGHT AWAY IF:  You have yellowish-white fluid (pus) in your thick spit.  Your cough gets worse.  Your medicine does not reduce coughing, and you are losing sleep.  You cough up blood.  You have trouble breathing.  Your pain gets worse and medicine does not help.  You have a fever. MAKE SURE YOU:   Understand these instructions.  Will watch your condition.  Will get help right away if you are not doing well or get worse. Document Released: 10/23/2010 Document Revised: 06/26/2013 Document Reviewed: 10/23/2010 [facility] Patient Information 2015 [place], [place]. This information is not intended to replace advice given to you by your health care provider. Make sure you discuss any questions you have with your health care provider.    May use Advil 600mg  every 8 hours for fever and body aches. The shot should help too. Use the cough syrup when you can rest. Delsym works great for day time. Mucinex for congestion. Should you feel that your are worsening please present to the ER for further evaluation.

## 2014-08-19 ENCOUNTER — Encounter (HOSPITAL_COMMUNITY): Payer: Self-pay | Admitting: Emergency Medicine

## 2014-08-19 ENCOUNTER — Emergency Department (INDEPENDENT_AMBULATORY_CARE_PROVIDER_SITE_OTHER)
Admission: EM | Admit: 2014-08-19 | Discharge: 2014-08-19 | Disposition: A | Discharge status: Home / Self Care | Payer: 59 | Source: Home / Self Care | Attending: Family Medicine | Admitting: Family Medicine

## 2014-08-19 DIAGNOSIS — R0982 Postnasal drip: Secondary | ICD-10-CM | POA: Diagnosis not present

## 2014-08-19 DIAGNOSIS — H6983 Other specified disorders of Eustachian tube, bilateral: Secondary | ICD-10-CM

## 2014-08-19 MED ORDER — FLUTICASONE PROPIONATE 50 MCG/ACT NA SUSP: 2.0000 | Freq: Every day | NASAL | Status: DC

## 2014-08-19 NOTE — ED Notes (Signed)
Pt states that she can not hear out of her left ear for the last few weeks. She states that she has some fluid in her ear.

## 2014-08-19 NOTE — ED Provider Notes (Addendum)
CSN: [id number]     Arrival date & time 08/19/14  1305 History   First MD Initiated Contact with Patient 08/19/14 1400     Chief Complaint  Patient presents with  . Hearing Problem   (Consider location/radiation/quality/duration/timing/severity/associated sxs/prior Treatment) HPI  4 weeks ago patient developed URI symptoms. This is slowly improving. Approximately 1 week ago patient developed left ear fullness and gradual loss of hearing. Problem is constant. Problem is getting worse. Denies pain, fevers, headache, nausea, vomiting, disequilibrium, LOC, chest pain, shortness of breath, palpitations. Patient is not tried anything for her symptoms. Denies any popping sensation in her ears. Denies any dizziness or lightheadedness.  Past Medical History  Diagnosis Date  . GERD (gastroesophageal reflux disease)   . History of hyperthyroidism   . Depression   . Carpal tunnel syndrome of right wrist 06/2013  . Hypertension     under control with med., has been on med. x 9 yr.  . Dental crowns present   . Arthritis     knees   Past Surgical History  Procedure Laterality Date  . Appendectomy    . Inguinal hernia repair    . Knee arthroscopy Right   . Carpal tunnel release Right 07/04/2013    Procedure: RIGHT CARPAL TUNNEL RELEASE;  Surgeon: Wynonia Sours, MD;  Location: [place];  Service: Orthopedics;  Laterality: Right;   History reviewed. No pertinent family history. History  Substance Use Topics  . Smoking status: Current Every Day Smoker -- 30 years    Types: Cigarettes  . Smokeless tobacco: Never Used     Comment: 7 cig./day  . Alcohol Use: Yes     Comment: infrequently   OB History    No data available     Review of Systems Per HPI with all other pertinent systems negative.   Allergies  Hydrocodone  Home Medications   Prior to Admission medications   Medication Sig Start Date End Date Taking? Authorizing Provider  amoxicillin-clavulanate (AUGMENTIN)  875-125 MG per tablet Take 1 tablet by mouth every 12 (twelve) hours. 07/20/14   Bjorn Pippin, PA-C  buPROPion (WELLBUTRIN XL) 150 MG 24 hr tablet Take 150 mg by mouth daily.    Historical Provider, MD  calcium carbonate (OS-CAL) 600 MG TABS tablet Take 600 mg by mouth 2 (two) times daily with a meal.    Historical Provider, MD  cholecalciferol (VITAMIN D) 1000 UNITS tablet Take 2,000 Units by mouth daily.    Historical Provider, MD  Coconut Oil 1000 MG CAPS Take 1,000 mg by mouth daily.    Historical Provider, MD  diclofenac (VOLTAREN) 75 MG EC tablet Take 75 mg by mouth 2 (two) times daily.    Historical Provider, MD  escitalopram (LEXAPRO) 10 MG tablet Take 10 mg by mouth daily.    Historical Provider, MD  fluticasone (FLONASE) 50 MCG/ACT nasal spray Place 2 sprays into both nostrils at bedtime. 08/19/14   Waldemar Dickens, MD  glucosamine-chondroitin 500-400 MG tablet Take 1 tablet by mouth 3 (three) times daily.    Historical Provider, MD  HYDROcodone-acetaminophen (NORCO) 5-325 MG per tablet Take 1 tablet by mouth every 6 (six) hours as needed for moderate pain. 07/04/13   Daryll Brod, MD  HYDROcodone-homatropine [facility]) 5-1.5 MG/5ML syrup Take 5 mLs by mouth every 6 (six) hours as needed for cough. 07/20/14   Bjorn Pippin, PA-C  levothyroxine (SYNTHROID, LEVOTHROID) 150 MCG tablet Take 150 mcg by mouth daily before breakfast.  Historical Provider, MD  losartan-hydrochlorothiazide (HYZAAR) 100-25 MG per tablet Take 1 tablet by mouth daily.    Historical Provider, MD  Magnesium 250 MG TABS Take 250 mg by mouth daily.    Historical Provider, MD  Omega-3 Fatty Acids (FISH OIL) 1000 MG CAPS Take by mouth.    Historical Provider, MD  omeprazole (PRILOSEC) 20 MG capsule Take 20 mg by mouth daily.    Historical Provider, MD  Turmeric 500 MG CAPS Take 500 mg by mouth daily.    Historical Provider, MD   BP 151/78 mmHg  Pulse 75  Temp(Src) 99.3 F (37.4 C) (Oral)  Resp 16  SpO2 96% Physical  Exam Physical Exam  Constitutional: oriented to person, place, and time. appears well-developed and well-nourished. No distress.  HENT:  Head: Normocephalic and atraumatic.  Bilateral serosanguineous effusions in her ears with no bulging or retraction of the TM, Eyes: EOMI. PERRL.  Neck: Normal range of motion.  Cardiovascular: RRR, no m/r/g, 2+ distal pulses,  Pulmonary/Chest: Effort normal and breath sounds normal. No respiratory distress.  Abdominal: Soft. Bowel sounds are normal. NonTTP, no distension.  Musculoskeletal: Normal range of motion. Non ttp, no effusion.  Neurological: alert and oriented to person, place, and time.  Skin: Skin is warm. No rash noted. non diaphoretic.  Psychiatric: normal mood and affect. behavior is normal. Judgment and thought content normal.   ED Course  Procedures (including critical care time) Labs Review Labs Reviewed - No data to display  Imaging Review No results found.   MDM   1. Eustachian tube dysfunction, bilateral   2. Post-nasal drip    Flonase, ibuprofen 600 800 mg, daily allergy pill such as Zyrtec, nasal saline. May need follow-up with primary care physician and subsequent ENT referral if not improving. No role of antibiotics at this point time is noninfectious.  Patient hypertensive and discussed avoiding medications with Sudafed in order to avoid elevation in blood pressure.    Waldemar Dickens, MD 08/19/14 [place], MD 08/19/14 [phone]

## 2014-08-19 NOTE — Discharge Instructions (Signed)
You have fluid in both years but this does not look infectious. The fluid is likely they're due to eustachian tube dysfunction. Please use Flonase twice daily for the next 2 days then just at night. Please consider starting a daily allergy pill such as Zyrtec 10 mg at night. Please also start using ibuprofen 600-800 mg every 6-8 hours. Please call your primary care doctor for follow-up in 2-4 weeks and keep this appointment if you're not better as he may need an ENT referral. Please also consider using nasal saline multiple times a day to clear your nasal passage.

## 2014-08-31 ENCOUNTER — Emergency Department (INDEPENDENT_AMBULATORY_CARE_PROVIDER_SITE_OTHER): Payer: Commercial Managed Care - HMO

## 2014-08-31 ENCOUNTER — Emergency Department (HOSPITAL_COMMUNITY)
Admission: EM | Admit: 2014-08-31 | Discharge: 2014-08-31 | Disposition: A | Discharge status: Home / Self Care | Payer: Commercial Managed Care - HMO | Source: Home / Self Care

## 2014-08-31 ENCOUNTER — Encounter (HOSPITAL_COMMUNITY): Payer: Self-pay

## 2014-08-31 DIAGNOSIS — S0511XA Contusion of eyeball and orbital tissues, right eye, initial encounter: Secondary | ICD-10-CM | POA: Diagnosis not present

## 2014-08-31 NOTE — Discharge Instructions (Signed)
Contusion °A contusion is a deep bruise. Contusions happen when an injury causes bleeding under the skin. Signs of bruising include pain, puffiness (swelling), and discolored skin. The contusion may turn blue, purple, or yellow. °HOME CARE  °· Put ice on the injured area. °¨ Put ice in a plastic bag. °¨ Place a towel between your skin and the bag. °¨ Leave the ice on for 15-20 minutes, 03-04 times a day. °· Only take medicine as told by your doctor. °· Rest the injured area. °· If possible, raise (elevate) the injured area to lessen puffiness. °GET HELP RIGHT AWAY IF:  °· You have more bruising or puffiness. °· You have pain that is getting worse. °· Your puffiness or pain is not helped by medicine. °MAKE SURE YOU:  °· Understand these instructions. °· Will watch your condition. °· Will get help right away if you are not doing well or get worse. °Document Released: 07/29/2007 Document Revised: 05/04/2011 Document Reviewed: 12/15/2010 °Exit[employer]® Patient Information ©2015 [employer]. This information is not intended to replace advice given to you by your health care provider. Make sure you discuss any questions you have with your health care provider. ° °

## 2014-08-31 NOTE — ED Notes (Signed)
C/o she was using hedge trimmer, when it slipped and struck her in right orbit area. Reportedly used ice right away

## 2014-08-31 NOTE — ED Provider Notes (Addendum)
CSN: [id number]     Arrival date & time 08/31/14  1341 History   None     (Consider location/radiation/quality/duration/timing/severity/associated sxs/prior Treatment) Patient is a 54 y.o. female presenting with eye injury. The history is provided by the patient. No language interpreter was used.  Eye Injury This is a new problem. The current episode started 3 to 5 hours ago. The problem occurs constantly. The problem has not changed since onset.Associated symptoms include headaches. Pertinent negatives include no chest pain, no abdominal pain and no shortness of breath. Nothing aggravates the symptoms. Nothing relieves the symptoms. She has tried a cold compress for the symptoms. The treatment provided no relief.  54 yo woman who struck right orbit area with butt handle of hedge clippers two hours Prior to arrival.  She had immediate discomfort associated with subsequent right periorbital headache and mild trouble focusing, especially with downward gaze.  Past Medical History  Diagnosis Date  . GERD (gastroesophageal reflux disease)   . History of hyperthyroidism   . Depression   . Carpal tunnel syndrome of right wrist 06/2013  . Hypertension     under control with med., has been on med. x 9 yr.  . Dental crowns present   . Arthritis     knees   Past Surgical History  Procedure Laterality Date  . Appendectomy    . Inguinal hernia repair    . Knee arthroscopy Right   . Carpal tunnel release Right 07/04/2013    Procedure: RIGHT CARPAL TUNNEL RELEASE;  Surgeon: Wynonia Sours, MD;  Location: [place];  Service: Orthopedics;  Laterality: Right;   No family history on file. History  Substance Use Topics  . Smoking status: Current Every Day Smoker -- 30 years    Types: Cigarettes  . Smokeless tobacco: Never Used     Comment: 7 cig./day  . Alcohol Use: Yes     Comment: infrequently   OB History    No data available     Review of Systems  Constitutional: Negative.    HENT: Negative for congestion, dental problem, ear discharge, ear pain and facial swelling.   Eyes: Positive for pain, redness and visual disturbance. Negative for photophobia, discharge and itching.  Respiratory: Negative for apnea and shortness of breath.   Cardiovascular: Negative for chest pain.  Gastrointestinal: Negative for abdominal pain.  Neurological: Positive for headaches. Negative for dizziness, syncope, facial asymmetry, light-headedness and numbness.    Allergies  Hydrocodone  Home Medications   Prior to Admission medications   Medication Sig Start Date End Date Taking? Authorizing Provider  amoxicillin-clavulanate (AUGMENTIN) 875-125 MG per tablet Take 1 tablet by mouth every 12 (twelve) hours. 07/20/14   Bjorn Pippin, PA-C  buPROPion (WELLBUTRIN XL) 150 MG 24 hr tablet Take 150 mg by mouth daily.    Historical Provider, MD  calcium carbonate (OS-CAL) 600 MG TABS tablet Take 600 mg by mouth 2 (two) times daily with a meal.    Historical Provider, MD  cholecalciferol (VITAMIN D) 1000 UNITS tablet Take 2,000 Units by mouth daily.    Historical Provider, MD  Coconut Oil 1000 MG CAPS Take 1,000 mg by mouth daily.    Historical Provider, MD  diclofenac (VOLTAREN) 75 MG EC tablet Take 75 mg by mouth 2 (two) times daily.    Historical Provider, MD  escitalopram (LEXAPRO) 10 MG tablet Take 10 mg by mouth daily.    Historical Provider, MD  fluticasone (FLONASE) 50 MCG/ACT nasal spray Place 2  sprays into both nostrils at bedtime. 08/19/14   Waldemar Dickens, MD  glucosamine-chondroitin 500-400 MG tablet Take 1 tablet by mouth 3 (three) times daily.    Historical Provider, MD  HYDROcodone-acetaminophen (NORCO) 5-325 MG per tablet Take 1 tablet by mouth every 6 (six) hours as needed for moderate pain. 07/04/13   Daryll Brod, MD  HYDROcodone-homatropine [facility]) 5-1.5 MG/5ML syrup Take 5 mLs by mouth every 6 (six) hours as needed for cough. 07/20/14   Bjorn Pippin, PA-C   levothyroxine (SYNTHROID, LEVOTHROID) 150 MCG tablet Take 150 mcg by mouth daily before breakfast.    Historical Provider, MD  losartan-hydrochlorothiazide (HYZAAR) 100-25 MG per tablet Take 1 tablet by mouth daily.    Historical Provider, MD  Magnesium 250 MG TABS Take 250 mg by mouth daily.    Historical Provider, MD  Omega-3 Fatty Acids (FISH OIL) 1000 MG CAPS Take by mouth.    Historical Provider, MD  omeprazole (PRILOSEC) 20 MG capsule Take 20 mg by mouth daily.    Historical Provider, MD  Turmeric 500 MG CAPS Take 500 mg by mouth daily.    Historical Provider, MD   BP 175/122 mmHg  Pulse 74  Temp(Src) 98.3 F (36.8 C) (Oral)  Resp 16  SpO2 96% Physical Exam  Constitutional: She is oriented to person, place, and time. She appears well-developed and well-nourished.  HENT:  Head: Normocephalic and atraumatic.  Right Ear: External ear normal.  Left Ear: External ear normal.  Eyes: Conjunctivae and EOM are normal. Pupils are equal, round, and reactive to light. Right eye exhibits no discharge. Left eye exhibits no discharge. No scleral icterus.  Patient wearing contacts.  Normal fundi  Neck: Normal range of motion. Neck supple.  Cardiovascular: Normal rate and regular rhythm.   Pulmonary/Chest: Effort normal.  Neurological: She is alert and oriented to person, place, and time.  Skin: Skin is warm and dry.  Psychiatric: She has a normal mood and affect. Her behavior is normal.  Nursing note and vitals reviewed. Patient became lightheaded in the radiology suite.  ED Course  Procedures (including critical care time) Labs Review Labs Reviewed - No data to display  Imaging Review Preliminary reading:  No fracture seen  MDM   Contusion to right eye with some vagal effects.  Patient to return if symptoms persist or worsen over next 24 hours.  Robyn Haber, MD   Robyn Haber, MD 08/31/14 [place], MD 08/31/14 1536

## 2014-09-01 ENCOUNTER — Emergency Department (HOSPITAL_COMMUNITY)
Admission: EM | Admit: 2014-09-01 | Discharge: 2014-09-01 | Disposition: A | Discharge status: Home / Self Care | Payer: Commercial Managed Care - HMO | Attending: Emergency Medicine | Admitting: Emergency Medicine

## 2014-09-01 ENCOUNTER — Emergency Department (HOSPITAL_COMMUNITY): Payer: Commercial Managed Care - HMO

## 2014-09-01 ENCOUNTER — Encounter (HOSPITAL_COMMUNITY): Payer: Self-pay

## 2014-09-01 DIAGNOSIS — M1990 Unspecified osteoarthritis, unspecified site: Secondary | ICD-10-CM | POA: Diagnosis not present

## 2014-09-01 DIAGNOSIS — Y998 Other external cause status: Secondary | ICD-10-CM | POA: Diagnosis not present

## 2014-09-01 DIAGNOSIS — F329 Major depressive disorder, single episode, unspecified: Secondary | ICD-10-CM | POA: Diagnosis not present

## 2014-09-01 DIAGNOSIS — S0990XA Unspecified injury of head, initial encounter: Secondary | ICD-10-CM | POA: Insufficient documentation

## 2014-09-01 DIAGNOSIS — W272XXA Contact with scissors, initial encounter: Secondary | ICD-10-CM | POA: Insufficient documentation

## 2014-09-01 DIAGNOSIS — I10 Essential (primary) hypertension: Secondary | ICD-10-CM | POA: Insufficient documentation

## 2014-09-01 DIAGNOSIS — Y9389 Activity, other specified: Secondary | ICD-10-CM | POA: Insufficient documentation

## 2014-09-01 DIAGNOSIS — R519 Headache, unspecified: Secondary | ICD-10-CM

## 2014-09-01 DIAGNOSIS — E0590 Thyrotoxicosis, unspecified without thyrotoxic crisis or storm: Secondary | ICD-10-CM | POA: Diagnosis not present

## 2014-09-01 DIAGNOSIS — S0591XA Unspecified injury of right eye and orbit, initial encounter: Secondary | ICD-10-CM | POA: Diagnosis not present

## 2014-09-01 DIAGNOSIS — Z79899 Other long term (current) drug therapy: Secondary | ICD-10-CM | POA: Insufficient documentation

## 2014-09-01 DIAGNOSIS — Z720 Tobacco use: Secondary | ICD-10-CM | POA: Insufficient documentation

## 2014-09-01 DIAGNOSIS — K219 Gastro-esophageal reflux disease without esophagitis: Secondary | ICD-10-CM | POA: Insufficient documentation

## 2014-09-01 DIAGNOSIS — Y92007 Garden or yard of unspecified non-institutional (private) residence as the place of occurrence of the external cause: Secondary | ICD-10-CM | POA: Diagnosis not present

## 2014-09-01 DIAGNOSIS — R112 Nausea with vomiting, unspecified: Secondary | ICD-10-CM

## 2014-09-01 DIAGNOSIS — R51 Headache: Secondary | ICD-10-CM

## 2014-09-01 LAB — URINALYSIS, ROUTINE W REFLEX MICROSCOPIC
Bilirubin Urine: NEGATIVE
Glucose, UA: NEGATIVE mg/dL
Hgb urine dipstick: NEGATIVE
KETONES UR: NEGATIVE mg/dL
LEUKOCYTES UA: NEGATIVE
NITRITE: NEGATIVE
PROTEIN: NEGATIVE mg/dL
Specific Gravity, Urine: 1.019 (ref 1.005–1.030)
Urobilinogen, UA: 0.2 mg/dL (ref 0.0–1.0)
pH: 5.5 (ref 5.0–8.0)

## 2014-09-01 LAB — COMPREHENSIVE METABOLIC PANEL
ALBUMIN: 3.4 g/dL — AB (ref 3.5–5.0)
ALT: 29 U/L (ref 14–54)
AST: 26 U/L (ref 15–41)
Alkaline Phosphatase: 95 U/L (ref 38–126)
Anion gap: 8 (ref 5–15)
BUN: 8 mg/dL (ref 6–20)
CHLORIDE: 101 mmol/L (ref 101–111)
CO2: 28 mmol/L (ref 22–32)
CREATININE: 0.65 mg/dL (ref 0.44–1.00)
Calcium: 8.9 mg/dL (ref 8.9–10.3)
GFR calc Af Amer: >60 mL/min (ref >60)
GFR calc non Af Amer: >60 mL/min (ref >60)
Glucose, Bld: 93 mg/dL (ref 65–99)
Potassium: 3.9 mmol/L (ref 3.5–5.1)
Sodium: 137 mmol/L (ref 135–145)
Total Bilirubin: 0.4 mg/dL (ref 0.3–1.2)
Total Protein: 6.9 g/dL (ref 6.5–8.1)

## 2014-09-01 LAB — CBC WITH DIFFERENTIAL/PLATELET
BASOS ABS: 0 10*3/uL (ref 0.0–0.1)
Basophils Relative: 0 % (ref 0–1)
EOS ABS: 0.3 10*3/uL (ref 0.0–0.7)
EOS PCT: 3 % (ref 0–5)
HEMATOCRIT: 40.4 % (ref 36.0–46.0)
Hemoglobin: 13.6 g/dL (ref 12.0–15.0)
Lymphocytes Relative: 38 % (ref 12–46)
Lymphs Abs: 3.5 10*3/uL (ref 0.7–4.0)
MCH: 30.8 pg (ref 26.0–34.0)
MCHC: 33.7 g/dL (ref 30.0–36.0)
MCV: 91.4 fL (ref 78.0–100.0)
MONO ABS: 0.6 10*3/uL (ref 0.1–1.0)
Monocytes Relative: 6 % (ref 3–12)
Neutro Abs: 5 10*3/uL (ref 1.7–7.7)
Neutrophils Relative %: 53 % (ref 43–77)
PLATELETS: 416 10*3/uL — AB (ref 150–400)
RBC: 4.42 MIL/uL (ref 3.87–5.11)
RDW: 13.4 % (ref 11.5–15.5)
WBC: 9.4 10*3/uL (ref 4.0–10.5)

## 2014-09-01 LAB — LIPASE, BLOOD: LIPASE: 29 U/L (ref 22–51)

## 2014-09-01 LAB — TROPONIN I

## 2014-09-01 MED ORDER — METOCLOPRAMIDE HCL 5 MG/ML IJ SOLN
10.0000 mg | Freq: Once | INTRAMUSCULAR | Status: AC
  Administered 2014-09-01: 10 mg via INTRAVENOUS
  Filled 2014-09-01: qty 2

## 2014-09-01 MED ORDER — FLUORESCEIN SODIUM 1 MG OP STRP
1.0000 | ORAL_STRIP | Freq: Once | OPHTHALMIC | Status: AC
  Administered 2014-09-01: 1 via OPHTHALMIC
  Filled 2014-09-01: qty 1

## 2014-09-01 MED ORDER — SODIUM CHLORIDE 0.9 % IV BOLUS (SEPSIS)
500.0000 mL | Freq: Once | INTRAVENOUS | Status: AC
  Administered 2014-09-01: 500 mL via INTRAVENOUS

## 2014-09-01 MED ORDER — KETOROLAC TROMETHAMINE 30 MG/ML IJ SOLN
30.0000 mg | Freq: Once | INTRAMUSCULAR | Status: AC
  Administered 2014-09-01: 30 mg via INTRAVENOUS
  Filled 2014-09-01: qty 1

## 2014-09-01 MED ORDER — ONDANSETRON HCL 4 MG PO TABS: 4.0000 mg | ORAL_TABLET | Freq: Four times a day (QID) | ORAL | Status: DC

## 2014-09-01 MED ORDER — PROPARACAINE HCL 0.5 % OP SOLN
1.0000 [drp] | Freq: Once | OPHTHALMIC | Status: AC
  Administered 2014-09-01: 1 [drp] via OPHTHALMIC
  Filled 2014-09-01: qty 15

## 2014-09-01 NOTE — ED Notes (Signed)
Pt verbalized understanding of d/c instructions and has no further questions. Pt stable, not dizzy and in NAD upon d/c.

## 2014-09-01 NOTE — Discharge Instructions (Signed)

## 2014-09-01 NOTE — ED Notes (Signed)
Yesterday was cutting limbs and hit herself in the right eye with the handle of the cutting shears. Had some double vision. Went to UC and had an xray to check for bleeding. Xray was ok. Went home. This morning at 0530 woke up with bad headache and became nauseated. Had thrown up several times this morning without anything on her stomach. Tried to sip diet coke and would immediately throw it up. Has been sleepy today, friend put symptoms in her phone and dropped her off that says her train of thought has been off, she has been disoriented, her eyes wouldn't focus. Pt also reports her BP has been elevated in the 200's.

## 2014-09-01 NOTE — ED Provider Notes (Signed)
CSN: [id number]     Arrival date & time 09/01/14  1731 History   First MD Initiated Contact with Patient 09/01/14 1927     Chief Complaint  Patient presents with  . Eye Injury  . Headache  . Emesis     (Consider location/radiation/quality/duration/timing/severity/associated sxs/prior Treatment) HPI Comments: Patient presents to the ER for evaluation of generalized weakness, increased fatigue, headache, nausea and vomiting. Symptoms began this morning. She reports that when she woke this morning she had a headache. She noticed that this was causing nausea and she has had multiple episodes of emesis to the course of today. She was unable to hold down any solids or liquids. She reports that she has been sleeping most of the day, feels like she can't sleep enough. She is not expressing any abdominal pain. There has not been any diarrhea.  Patient also reports that she injured her right eye yesterday. She was working in the yard and hit her right eye with hedge clippers. She saw her doctor for this at that time, was told everything looks okay with her eye. She is not evidencing any significant vision change.  Patient is a 54 y.o. female presenting with eye injury, headaches, and vomiting.  Eye Injury Associated symptoms include headaches.  Headache Associated symptoms: nausea and vomiting   Emesis Associated symptoms: headaches     Past Medical History  Diagnosis Date  . GERD (gastroesophageal reflux disease)   . History of hyperthyroidism   . Depression   . Carpal tunnel syndrome of right wrist 06/2013  . Hypertension     under control with med., has been on med. x 9 yr.  . Dental crowns present   . Arthritis     knees   Past Surgical History  Procedure Laterality Date  . Appendectomy    . Inguinal hernia repair    . Knee arthroscopy Right   . Carpal tunnel release Right 07/04/2013    Procedure: RIGHT CARPAL TUNNEL RELEASE;  Surgeon: Wynonia Sours, MD;  Location: [place];  Service: Orthopedics;  Laterality: Right;   No family history on file. History  Substance Use Topics  . Smoking status: Current Every Day Smoker -- 30 years    Types: Cigarettes  . Smokeless tobacco: Never Used     Comment: 7 cig./day  . Alcohol Use: Yes     Comment: infrequently   OB History    No data available     Review of Systems  Gastrointestinal: Positive for nausea and vomiting.  Neurological: Positive for headaches.  All other systems reviewed and are negative.     Allergies  Hydrocodone-homatropine  Home Medications   Prior to Admission medications   Medication Sig Start Date End Date Taking? Authorizing Provider  buPROPion (WELLBUTRIN XL) 150 MG 24 hr tablet Take 150 mg by mouth daily.   Yes Historical Provider, MD  diclofenac (VOLTAREN) 75 MG EC tablet Take 75 mg by mouth at bedtime.    Yes Historical Provider, MD  escitalopram (LEXAPRO) 10 MG tablet Take 10 mg by mouth daily.   Yes Historical Provider, MD  fluticasone (FLONASE) 50 MCG/ACT nasal spray Place 2 sprays into both nostrils at bedtime. Patient taking differently: Place 2 sprays into both nostrils 2 (two) times daily as needed (head/ear congestion).  08/19/14  Yes Waldemar Dickens, MD  ibuprofen (ADVIL,MOTRIN) 200 MG tablet Take 800 mg by mouth every 4 (four) hours as needed (pain).   Yes Historical Provider, MD  levothyroxine (SYNTHROID, LEVOTHROID) 150 MCG tablet Take 150 mcg by mouth daily before breakfast.   Yes Historical Provider, MD  losartan-hydrochlorothiazide (HYZAAR) 100-25 MG per tablet Take 1 tablet by mouth daily.   Yes Historical Provider, MD  omeprazole (PRILOSEC) 20 MG capsule Take 20 mg by mouth daily before lunch.    Yes Historical Provider, MD  HYDROcodone-acetaminophen (NORCO) 5-325 MG per tablet Take 1 tablet by mouth every 6 (six) hours as needed for moderate pain. Patient not taking: Reported on 09/01/2014 07/04/13   Daryll Brod, MD   BP 169/95 mmHg  Pulse 71  Temp(Src) 98  F (36.7 C) (Oral)  Resp 21  Ht 5\' 4"  (1.626 m)  Wt 230 lb 1.6 oz (104.373 kg)  BMI 39.48 kg/m2  SpO2 95% Physical Exam  Constitutional: She is oriented to person, place, and time. She appears well-developed and well-nourished. No distress.  HENT:  Head: Normocephalic and atraumatic.  Right Ear: Hearing normal.  Left Ear: Hearing normal.  Nose: Nose normal.  Mouth/Throat: Oropharynx is clear and moist and mucous membranes are normal.  Eyes: Conjunctivae and EOM are normal. Pupils are equal, round, and reactive to light.  No eyelid injury or swelling  Intraocular pressure OD equals 14 Intraocular pressure OS equals 15  Fluorescein exam reveals no dye uptake, no evidence of abrasion, ulceration. Seidel exam negative.  Neck: Normal range of motion. Neck supple.  Cardiovascular: Regular rhythm, S1 normal and S2 normal.  Exam reveals no gallop and no friction rub.   No murmur heard. Pulmonary/Chest: Effort normal and breath sounds normal. No respiratory distress. She exhibits no tenderness.  Abdominal: Soft. Normal appearance and bowel sounds are normal. There is no hepatosplenomegaly. There is no tenderness. There is no rebound, no guarding, no tenderness at McBurney's point and negative Murphy's sign. No hernia.  Musculoskeletal: Normal range of motion.  Neurological: She is alert and oriented to person, place, and time. She has normal strength. No cranial nerve deficit or sensory deficit. Coordination normal. GCS eye subscore is 4. GCS verbal subscore is 5. GCS motor subscore is 6.  Extraocular muscle movement: normal No visual field cut Pupils: equal and reactive both direct and consensual response is normal No nystagmus present    Sensory function is intact to light touch, pinprick Proprioception intact  Grip strength 5/5 symmetric in upper extremities No pronator drift Normal finger to nose bilaterally  Lower extremity strength 5/5 against gravity Normal heel to shin  bilaterally  Gait: normal   Skin: Skin is warm, dry and intact. No rash noted. No cyanosis.  Psychiatric: She has a normal mood and affect. Her speech is normal and behavior is normal. Thought content normal.  Nursing note and vitals reviewed.   ED Course  Procedures (including critical care time) Labs Review Labs Reviewed  CBC WITH DIFFERENTIAL/PLATELET - Abnormal; Notable for the following:    Platelets 416 (*)    All other components within normal limits  COMPREHENSIVE METABOLIC PANEL - Abnormal; Notable for the following:    Albumin 3.4 (*)    All other components within normal limits  LIPASE, BLOOD  URINALYSIS, ROUTINE W REFLEX MICROSCOPIC (NOT AT [facility])  TROPONIN I    Imaging Review Dg Orbits  08/31/2014   CLINICAL DATA:  54 year old female with trauma to the right orbit  EXAM: ORBITS - COMPLETE 4+ VIEW  COMPARISON:  None.  FINDINGS: There is no evidence of fracture or other significant bone abnormality. No orbital emphysema or sinus air-fluid levels are seen.  IMPRESSION: No fracture.   Electronically Signed   By: Anner Crete M.D.   On: 08/31/2014 15:10   Ct Head Wo Contrast  09/01/2014   CLINICAL DATA:  Hit in right eye with handle of cutting shears. Headache and nausea. Vomiting. Lethargy. Initial encounter.  EXAM: CT HEAD WITHOUT CONTRAST  TECHNIQUE: Contiguous axial images were obtained from the base of the skull through the vertex without intravenous contrast.  COMPARISON:  None.  FINDINGS: There is no evidence of acute infarction, mass lesion, or intra- or extra-axial hemorrhage on CT. Minimal artifact from volume averaging is noted at the right temporal lobe.  The posterior fossa, including the cerebellum, brainstem and fourth ventricle, is within normal limits. The third and lateral ventricles, and basal ganglia are unremarkable in appearance. The cerebral hemispheres are symmetric in appearance, with normal gray-white differentiation. No mass effect or midline shift is  seen.  There is no evidence of fracture; visualized osseous structures are unremarkable in appearance. The orbits are within normal limits. The paranasal sinuses and mastoid air cells are well-aerated. No significant soft tissue abnormalities are seen.  IMPRESSION: No evidence of traumatic intracranial injury or fracture.   Electronically Signed   By: Garald Balding M.D.   On: 09/01/2014 19:33     EKG Interpretation None      MDM   Final diagnoses:  None   hypertension  Headache  She presented to the ER for evaluation of headache. Patient reports that she had headache present upon awakening this morning. She has a normal neurologic examination. She did indicate that she injured her eye yesterday. Her ocular exam, however, was entirely normal. No evidence of glaucoma, orbital injury. There is no abrasion or infection present.  With Toradol and Reglan. Her nausea and vomiting has resolved. Headache is improved and her blood pressure is improved as well. Patient is appropriate for discharge. She has been hydrated here in the ER. Will be discharged to home, continue symptomatically treatment and follow-up with PCP.    Orpah Greek, MD 09/01/14 2206

## 2014-11-22 ENCOUNTER — Other Ambulatory Visit: Payer: Self-pay | Admitting: Family Medicine

## 2014-11-22 ENCOUNTER — Ambulatory Visit
Admission: RE | Admit: 2014-11-22 | Discharge: 2014-11-22 | Disposition: A | Discharge status: Home / Self Care | Payer: Commercial Managed Care - HMO | Source: Ambulatory Visit | Attending: Family Medicine | Admitting: Family Medicine

## 2014-11-22 DIAGNOSIS — M545 Low back pain: Secondary | ICD-10-CM

## 2015-02-24 ENCOUNTER — Inpatient Hospital Stay (HOSPITAL_COMMUNITY)
Admission: EM | Admit: 2015-02-24 | Discharge: 2015-02-25 | DRG: 313 | Disposition: A | Discharge status: Home / Self Care | Payer: Commercial Managed Care - HMO | Attending: Internal Medicine | Admitting: Internal Medicine

## 2015-02-24 ENCOUNTER — Emergency Department (HOSPITAL_COMMUNITY): Payer: Commercial Managed Care - HMO

## 2015-02-24 ENCOUNTER — Encounter (HOSPITAL_COMMUNITY): Payer: Self-pay

## 2015-02-24 DIAGNOSIS — E039 Hypothyroidism, unspecified: Secondary | ICD-10-CM

## 2015-02-24 DIAGNOSIS — I10 Essential (primary) hypertension: Secondary | ICD-10-CM

## 2015-02-24 DIAGNOSIS — I160 Hypertensive urgency: Secondary | ICD-10-CM | POA: Diagnosis present

## 2015-02-24 DIAGNOSIS — E038 Other specified hypothyroidism: Secondary | ICD-10-CM | POA: Diagnosis not present

## 2015-02-24 DIAGNOSIS — Z885 Allergy status to narcotic agent status: Secondary | ICD-10-CM | POA: Diagnosis not present

## 2015-02-24 DIAGNOSIS — F329 Major depressive disorder, single episode, unspecified: Secondary | ICD-10-CM | POA: Diagnosis present

## 2015-02-24 DIAGNOSIS — R0789 Other chest pain: Secondary | ICD-10-CM | POA: Diagnosis not present

## 2015-02-24 DIAGNOSIS — E785 Hyperlipidemia, unspecified: Secondary | ICD-10-CM | POA: Diagnosis present

## 2015-02-24 DIAGNOSIS — Z720 Tobacco use: Secondary | ICD-10-CM | POA: Insufficient documentation

## 2015-02-24 DIAGNOSIS — K219 Gastro-esophageal reflux disease without esophagitis: Secondary | ICD-10-CM

## 2015-02-24 DIAGNOSIS — F32A Depression, unspecified: Secondary | ICD-10-CM | POA: Diagnosis present

## 2015-02-24 DIAGNOSIS — E876 Hypokalemia: Secondary | ICD-10-CM | POA: Diagnosis present

## 2015-02-24 DIAGNOSIS — F17210 Nicotine dependence, cigarettes, uncomplicated: Secondary | ICD-10-CM | POA: Diagnosis present

## 2015-02-24 DIAGNOSIS — R079 Chest pain, unspecified: Secondary | ICD-10-CM | POA: Diagnosis present

## 2015-02-24 DIAGNOSIS — Z6841 Body Mass Index (BMI) 40.0 and over, adult: Secondary | ICD-10-CM | POA: Diagnosis not present

## 2015-02-24 DIAGNOSIS — M1990 Unspecified osteoarthritis, unspecified site: Secondary | ICD-10-CM

## 2015-02-24 DIAGNOSIS — E669 Obesity, unspecified: Secondary | ICD-10-CM | POA: Diagnosis present

## 2015-02-24 HISTORY — DX: Tobacco use: Z72.0

## 2015-02-24 HISTORY — DX: Chest pain, unspecified: R07.9

## 2015-02-24 HISTORY — DX: Hypothyroidism, unspecified: E03.9

## 2015-02-24 LAB — BASIC METABOLIC PANEL
Anion gap: 9 (ref 5–15)
BUN: 9 mg/dL (ref 6–20)
CO2: 24 mmol/L (ref 22–32)
CREATININE: 0.64 mg/dL (ref 0.44–1.00)
Calcium: 8.7 mg/dL — ABNORMAL LOW (ref 8.9–10.3)
Chloride: 105 mmol/L (ref 101–111)
Glucose, Bld: 102 mg/dL — ABNORMAL HIGH (ref 65–99)
Potassium: 3.8 mmol/L (ref 3.5–5.1)
SODIUM: 138 mmol/L (ref 135–145)

## 2015-02-24 LAB — CBC
HCT: 41.9 % (ref 36.0–46.0)
Hemoglobin: 13.9 g/dL (ref 12.0–15.0)
MCH: 30.2 pg (ref 26.0–34.0)
MCHC: 33.2 g/dL (ref 30.0–36.0)
MCV: 91.1 fL (ref 78.0–100.0)
Platelets: 376 10*3/uL (ref 150–400)
RBC: 4.6 MIL/uL (ref 3.87–5.11)
RDW: 13.1 % (ref 11.5–15.5)
WBC: 7.1 10*3/uL (ref 4.0–10.5)

## 2015-02-24 LAB — I-STAT TROPONIN, ED
Troponin i, poc: 0.01 ng/mL (ref 0.00–0.08)
Troponin i, poc: 0.01 ng/mL (ref 0.00–0.08)

## 2015-02-24 LAB — PROTIME-INR
INR: 0.97 (ref 0.00–1.49)
Prothrombin Time: 13 seconds (ref 11.6–15.2)

## 2015-02-24 LAB — TROPONIN I: Troponin I: <0.03 ng/mL (ref <0.031)

## 2015-02-24 LAB — APTT: aPTT: 31 seconds (ref 24–37)

## 2015-02-24 MED ORDER — NITROGLYCERIN 0.4 MG SL SUBL
0.4000 mg | SUBLINGUAL_TABLET | SUBLINGUAL | Status: AC | PRN
  Administered 2015-02-24 (×3): 0.4 mg via SUBLINGUAL
  Filled 2015-02-24: qty 1

## 2015-02-24 MED ORDER — PANTOPRAZOLE SODIUM 40 MG PO TBEC
40.0000 mg | DELAYED_RELEASE_TABLET | Freq: Every day | ORAL | Status: DC
  Administered 2015-02-25: 40 mg via ORAL
  Filled 2015-02-24 (×2): qty 1

## 2015-02-24 MED ORDER — HYDRALAZINE HCL 20 MG/ML IJ SOLN
5.0000 mg | INTRAMUSCULAR | Status: DC | PRN
  Administered 2015-02-25: 5 mg via INTRAVENOUS
  Filled 2015-02-24: qty 1

## 2015-02-24 MED ORDER — ACETAMINOPHEN 325 MG PO TABS
650.0000 mg | ORAL_TABLET | Freq: Four times a day (QID) | ORAL | Status: DC | PRN
  Filled 2015-02-24: qty 2

## 2015-02-24 MED ORDER — FLUTICASONE PROPIONATE 50 MCG/ACT NA SUSP
2.0000 | Freq: Two times a day (BID) | NASAL | Status: DC | PRN
  Filled 2015-02-24: qty 16

## 2015-02-24 MED ORDER — MORPHINE SULFATE (PF) 2 MG/ML IV SOLN
2.0000 mg | INTRAVENOUS | Status: DC | PRN
  Administered 2015-02-25: 2 mg via INTRAVENOUS
  Filled 2015-02-24: qty 1

## 2015-02-24 MED ORDER — LOSARTAN POTASSIUM 50 MG PO TABS
100.0000 mg | ORAL_TABLET | Freq: Every day | ORAL | Status: DC
  Filled 2015-02-24 (×2): qty 2

## 2015-02-24 MED ORDER — HEPARIN SODIUM (PORCINE) 5000 UNIT/ML IJ SOLN
5000.0000 [IU] | Freq: Three times a day (TID) | INTRAMUSCULAR | Status: DC
  Administered 2015-02-24 – 2015-02-25 (×2): 5000 [IU] via SUBCUTANEOUS
  Filled 2015-02-24 (×2): qty 1

## 2015-02-24 MED ORDER — ONDANSETRON HCL 4 MG/2ML IJ SOLN
4.0000 mg | Freq: Four times a day (QID) | INTRAMUSCULAR | Status: DC | PRN
  Administered 2015-02-25: 4 mg via INTRAVENOUS
  Filled 2015-02-24 (×2): qty 2

## 2015-02-24 MED ORDER — SODIUM CHLORIDE 0.9 % IV SOLN
INTRAVENOUS | Status: DC
  Administered 2015-02-24: 22:00:00 via INTRAVENOUS

## 2015-02-24 MED ORDER — ATORVASTATIN CALCIUM 40 MG PO TABS
40.0000 mg | ORAL_TABLET | Freq: Every day | ORAL | Status: DC
  Administered 2015-02-24: 40 mg via ORAL
  Filled 2015-02-24 (×2): qty 1

## 2015-02-24 MED ORDER — BUPROPION HCL ER (XL) 150 MG PO TB24
150.0000 mg | ORAL_TABLET | Freq: Every day | ORAL | Status: DC
Start: 2015-02-25 — End: 2015-02-25
  Administered 2015-02-25: 150 mg via ORAL
  Filled 2015-02-24 (×2): qty 1

## 2015-02-24 MED ORDER — NITROGLYCERIN 0.2 MG/HR TD PT24
0.2000 mg | MEDICATED_PATCH | Freq: Every day | TRANSDERMAL | Status: DC
  Administered 2015-02-24: 0.2 mg via TRANSDERMAL
  Filled 2015-02-24 (×2): qty 1

## 2015-02-24 MED ORDER — ASPIRIN 325 MG PO TABS
325.0000 mg | ORAL_TABLET | Freq: Every day | ORAL | Status: DC
  Administered 2015-02-25: 325 mg via ORAL
  Filled 2015-02-24: qty 1

## 2015-02-24 MED ORDER — ZOLPIDEM TARTRATE 5 MG PO TABS: 5.0000 mg | ORAL_TABLET | Freq: Every evening | ORAL | Status: DC | PRN

## 2015-02-24 MED ORDER — ESCITALOPRAM OXALATE 10 MG PO TABS
10.0000 mg | ORAL_TABLET | Freq: Every day | ORAL | Status: DC
  Filled 2015-02-24 (×2): qty 1

## 2015-02-24 MED ORDER — CARVEDILOL 3.125 MG PO TABS
3.1250 mg | ORAL_TABLET | Freq: Two times a day (BID) | ORAL | Status: DC
  Administered 2015-02-24: 3.125 mg via ORAL
  Filled 2015-02-24 (×3): qty 1

## 2015-02-24 MED ORDER — NITROGLYCERIN 0.4 MG SL SUBL: 0.4000 mg | SUBLINGUAL_TABLET | SUBLINGUAL | Status: DC | PRN

## 2015-02-24 MED ORDER — NICOTINE 21 MG/24HR TD PT24
21.0000 mg | MEDICATED_PATCH | Freq: Every day | TRANSDERMAL | Status: DC
  Administered 2015-02-24: 21 mg via TRANSDERMAL
  Filled 2015-02-24: qty 1

## 2015-02-24 MED ORDER — LEVOTHYROXINE SODIUM 75 MCG PO TABS
150.0000 ug | ORAL_TABLET | Freq: Every day | ORAL | Status: DC
  Administered 2015-02-25: 150 ug via ORAL
  Filled 2015-02-24: qty 1
  Filled 2015-02-24: qty 2

## 2015-02-24 NOTE — ED Notes (Signed)
Lab at bedside

## 2015-02-24 NOTE — H&P (Addendum)
[facility] History and Physical  Sheri Villarreal [id number] DOB: 02-17-1961 DOA: 02/24/2015  Referring physician: ED physician PCP: Gerrit Heck, MD  Specialists:   Chief Complaint: Chest pain  HPI: Sheri Villarreal is a 55 y.o. female with PMH of hypertension, GERD, secondary hypothyroidism (patient had history of Graves' disease, S/P of radiation), arthritis, GERD, depression, who presents with chest pain.  Patient reports he started having chest pain at about 3 PM. The chest pain is located in the substernal area, 8 out of 10 in severity, constant, pressure-like, radiating to the right jaw. It is associated with shortness of breath, nausea and double vision. Her chest pain has gradually subsided after she was given sublingual nitroglycerin. She was found to have blood pressure 210/130, which improved to 190/100 after SL nitro. Patient does not have recent long distant traveling, tenderness over calf areas, cough, fever or chills. No unilateral weakness, numbness or tingling sensations, no hearing loss. Currently patient' is chest pain-free when I saw patient in the emergency room.  In ED, patient was found to haveNegative troponin, WBC 7.1, temperature normal, no tachycardia, electrolytes and renal function okay, negative chest x-ray. Patient's admitted to inpatient for further evaluation and treatment.   EKG: Independently reviewed. QTC 457, nonspecific T-wave change   Where does patient live?   At home    Can patient participate in ADLs?  Yes     Review of Systems:   General: no fevers, chills, no changes in body weight,has fatigue HEENT: had double vision, no hearing changes or sore throat Pulm: has dyspnea, no coughing, wheezing CV: has chest pain, no palpitations Abd: has nausea, no vomiting, abdominal pain, diarrhea, constipation GU: no dysuria, burning on urination, increased urinary frequency, hematuria  Ext: no leg edema Neuro: no unilateral  weakness, numbness, or tingling, no vision change or hearing loss Skin: no rash MSK: No muscle spasm, no deformity, no limitation of range of movement in spin Heme: No easy bruising.  Travel history: No recent long distant travel.  Allergy:  Allergies  Allergen Reactions  . Hydrocodone-Homatropine Itching    Reaction to Hycodan cough syrup (pt has taken norco in the past with no reaction)    Past Medical History  Diagnosis Date  . GERD (gastroesophageal reflux disease)   . History of hyperthyroidism   . Depression   . Carpal tunnel syndrome of right wrist 06/2013  . Hypertension     under control with med., has been on med. x 9 yr.  . Dental crowns present   . Arthritis     knees  . Tobacco abuse     Past Surgical History  Procedure Laterality Date  . Appendectomy    . Inguinal hernia repair    . Knee arthroscopy Right   . Carpal tunnel release Right 07/04/2013    Procedure: RIGHT CARPAL TUNNEL RELEASE;  Surgeon: Wynonia Sours, MD;  Location: [place];  Service: Orthopedics;  Laterality: Right;    Social History:  reports that she has been smoking Cigarettes.  She has smoked for the past 30 years. She has never used smokeless tobacco. She reports that she drinks alcohol. She reports that she does not use illicit drugs.  Family History:  Family History  Problem Relation Age of Onset  . Hypertension Mother   . Hypertension Father   . Kidney disease Father   . Diverticulosis Father   . Diabetes Sister   . Diabetes Brother      Prior to  Admission medications   Medication Sig Start Date End Date Taking? Authorizing Provider  acetaminophen (TYLENOL) 500 MG tablet Take 1,000 mg by mouth every 6 (six) hours as needed for mild pain.   Yes Historical Provider, MD  buPROPion (WELLBUTRIN XL) 150 MG 24 hr tablet Take 150 mg by mouth daily.   Yes Historical Provider, MD  diclofenac (VOLTAREN) 75 MG EC tablet Take 75 mg by mouth at bedtime.    Yes Historical  Provider, MD  escitalopram (LEXAPRO) 10 MG tablet Take 10 mg by mouth daily.   Yes Historical Provider, MD  fluticasone (FLONASE) 50 MCG/ACT nasal spray Place 2 sprays into both nostrils at bedtime. Patient taking differently: Place 2 sprays into both nostrils 2 (two) times daily as needed (head/ear congestion).  08/19/14  Yes Waldemar Dickens, MD  ibuprofen (ADVIL,MOTRIN) 200 MG tablet Take 800 mg by mouth every 4 (four) hours as needed (pain).   Yes Historical Provider, MD  levothyroxine (SYNTHROID, LEVOTHROID) 150 MCG tablet Take 150 mcg by mouth daily before breakfast.   Yes Historical Provider, MD  losartan-hydrochlorothiazide (HYZAAR) 100-25 MG per tablet Take 1 tablet by mouth daily.   Yes Historical Provider, MD  omeprazole (PRILOSEC) 20 MG capsule Take 20 mg by mouth daily before lunch.    Yes Historical Provider, MD    Physical Exam: Filed Vitals:   02/24/15 1700 02/24/15 1745 02/24/15 1815 02/24/15 1845  BP: 122/79 129/84 141/83 149/72  Pulse: 78 77 68 78  Temp:      TempSrc:      Resp: 13 15 14 16   SpO2: 95% 98% 99% 99%   General: Not in acute distress HEENT:       Eyes: PERRL, EOMI, no scleral icterus.       ENT: No discharge from the ears and nose, no pharynx injection, no tonsillar enlargement.        Neck: No JVD, no bruit, no mass felt. Heme: No neck lymph node enlargement. Cardiac: S1/S2, RRR, No murmurs, No gallops or rubs. Pulm: Good air movement bilaterally. No rales, wheezing, rhonchi or rubs. Abd: Soft, nondistended, nontender, no rebound pain, no organomegaly, BS present. Ext: No pitting leg edema bilaterally. 2+DP/PT pulse bilaterally. Musculoskeletal: No joint deformities, No joint redness or warmth, no limitation of ROM in spin. Skin: No rashes.  Neuro: Alert, oriented X3, cranial nerves II-XII grossly intact, muscle strength 5/5 in all extremities, sensation to light touch intact. Knee reflex 1+ bilaterally. Negative Babinski's sign. Normal finger to nose  test. Psych: Patient is not psychotic, no suicidal or hemocidal ideation.  Labs on Admission:  Basic Metabolic Panel:  Recent Labs Lab 02/24/15 1636  NA 138  K 3.8  CL 105  CO2 24  GLUCOSE 102*  BUN 9  CREATININE 0.64  CALCIUM 8.7*   Liver Function Tests: No results for input(s): AST, ALT, ALKPHOS, BILITOT, PROT, ALBUMIN in the last 168 hours. No results for input(s): LIPASE, AMYLASE in the last 168 hours. No results for input(s): AMMONIA in the last 168 hours. CBC:  Recent Labs Lab 02/24/15 1636  WBC 7.1  HGB 13.9  HCT 41.9  MCV 91.1  PLT 376   Cardiac Enzymes: No results for input(s): CKTOTAL, CKMB, CKMBINDEX, TROPONINI in the last 168 hours.  BNP (last 3 results) No results for input(s): BNP in the last 8760 hours.  ProBNP (last 3 results) No results for input(s): PROBNP in the last 8760 hours.  CBG: No results for input(s): GLUCAP in the last 168  hours.  Radiological Exams on Admission: Dg Chest 2 View  02/24/2015  CLINICAL DATA:  Chest pain.  Shortness of breath. EXAM: CHEST  2 VIEW COMPARISON:  07/08/2009 FINDINGS: The heart size and mediastinal contours are within normal limits. Both lungs are clear. The visualized skeletal structures are unremarkable. IMPRESSION: Normal exam. Electronically Signed   By: Lorriane Shire M.D.   On: 02/24/2015 17:00    Assessment/Plan Principal Problem:   Chest pain Active Problems:   GERD (gastroesophageal reflux disease)   Hypertensive urgency   Arthritis   Depression   Hypothyroidism  Chest pain:  It is likely due to demanding ischemia 2/2 hypertensive urgency. No pneumonia on chest x-ray. No shortness of breath or any signs of DVT, less likely to have PE. Will admit for chest pain rule out. Currently chest pain-free.  - will admit to Tele bed  - cycle CE q6 x3 and repeat her EKG in the am  - prn Nitroglycerin, Morphine -start aspirin, lipitor, Coreg - Risk factor stratification: will check FLP, uds and A1C  -  Consider cardiology consult if test positive for CEs  - 2d echo  Hypertensive urgency: Blood pressure 210/130, which responded to nitroglycerin, currently blood pressure is 149/72. -Switch Hyzaar to losartan while patient is on NPO -Start Coreg 3.125 mg twice a day -IV hydralazine when necessary -NTG patch 0.2 mg x 1  GERD: -Protonix  Secondary  Hypothyroidism: TSH was 0.008 on 629/09, T4 2.03 -Continue Synthroid -Check TSH  Depression: Stable, no suicidal or homicidal ideations. -Continue home medications: Lexapro, Wellbutrin  Arthritis: -Hold Voltaren and ibuprofen since newly started high-dose aspirin, to avoid stomach disturbance -When necessary Tylenol    DVT ppx: SQ Heparin     Code Status: Full code Family Communication:   Yes, patient's  mother     at bed side Disposition Plan: Admit to inpatient   Date of Service 02/24/2015    Ivor Costa [facility] Pager [phone]  If 7PM-7AM, please contact night-coverage [email or web address] Password [email or web address] 02/24/2015, 8:11 PM

## 2015-02-24 NOTE — ED Provider Notes (Signed)
CSN: [id number]     Arrival date & time 02/24/15  1609 History   First MD Initiated Contact with Patient 02/24/15 1609     Chief Complaint  Patient presents with  . Chest Pain   55 yo F w/PMH of GERD, Grave's Dz, HTN, and depression, who is a smoker, presents w/CP. Pt was watching tv when suddenly felt an 8/10, constant stabbing and squeezing chest pain in the middle of her chest which initially did not radiate, but then began going up her right jaw. She stood up after several minutes of this un-abating pain and felt dizzy as well. Took ASA with no help. She called for help and EMS gave her SL nitro which brought her pain down to 4/10. Pain was not accompanied by diaphoresis, nausea, or SOB. Pain didn't radiate to her back.   She denies cough, SOB, fever, chills, N/V, diarrhea, constipation, hematemesis, dysuria, hematuria, leg pain, leg swelling, sick contacts, or recent travel.   (Consider location/radiation/quality/duration/timing/severity/associated sxs/prior Treatment) Patient is a 55 y.o. female presenting with chest pain.  Chest Pain Pain location:  Substernal area Pain quality: pressure, sharp and stabbing   Pain radiates to:  R jaw Pain radiates to the back: no   Pain severity:  Severe Onset quality:  Sudden Duration:  1 hour Timing:  Constant Progression:  Partially resolved Chronicity:  New Context: at rest   Relieved by:  Nothing Associated symptoms: dizziness   Associated symptoms: no abdominal pain, no diaphoresis, no fever, no headache, no heartburn, no nausea, no near-syncope, no palpitations, no shortness of breath, no syncope and not vomiting   Risk factors: hypertension and smoking   Risk factors: no coronary artery disease, no diabetes mellitus, no high cholesterol and no prior DVT/PE     Past Medical History  Diagnosis Date  . GERD (gastroesophageal reflux disease)   . History of hyperthyroidism   . Depression   . Carpal tunnel syndrome of right wrist 06/2013   . Hypertension     under control with med., has been on med. x 9 yr.  . Dental crowns present   . Arthritis     knees  . Tobacco abuse    Past Surgical History  Procedure Laterality Date  . Appendectomy    . Inguinal hernia repair    . Knee arthroscopy Right   . Carpal tunnel release Right 07/04/2013    Procedure: RIGHT CARPAL TUNNEL RELEASE;  Surgeon: Wynonia Sours, MD;  Location: [place];  Service: Orthopedics;  Laterality: Right;   History reviewed. No pertinent family history. Social History  Substance Use Topics  . Smoking status: Current Every Day Smoker -- 30 years    Types: Cigarettes  . Smokeless tobacco: Never Used     Comment: 7 cig./day  . Alcohol Use: Yes     Comment: infrequently   OB History    No data available     Review of Systems  Constitutional: Negative for fever, chills and diaphoresis.  Respiratory: Negative for shortness of breath.   Cardiovascular: Positive for chest pain. Negative for palpitations, leg swelling, syncope and near-syncope.  Gastrointestinal: Negative for heartburn, nausea, vomiting, abdominal pain, diarrhea, constipation and abdominal distention.  Genitourinary: Negative for dysuria, frequency, flank pain and decreased urine volume.  Neurological: Positive for dizziness. Negative for speech difficulty, light-headedness and headaches.  All other systems reviewed and are negative.     Allergies  Hydrocodone-homatropine  Home Medications   Prior to Admission medications  Medication Sig Start Date End Date Taking? Authorizing Provider  acetaminophen (TYLENOL) 500 MG tablet Take 1,000 mg by mouth every 6 (six) hours as needed for mild pain.   Yes Historical Provider, MD  buPROPion (WELLBUTRIN XL) 150 MG 24 hr tablet Take 150 mg by mouth daily.   Yes Historical Provider, MD  diclofenac (VOLTAREN) 75 MG EC tablet Take 75 mg by mouth at bedtime.    Yes Historical Provider, MD  escitalopram (LEXAPRO) 10 MG tablet  Take 10 mg by mouth daily.   Yes Historical Provider, MD  fluticasone (FLONASE) 50 MCG/ACT nasal spray Place 2 sprays into both nostrils at bedtime. Patient taking differently: Place 2 sprays into both nostrils 2 (two) times daily as needed (head/ear congestion).  08/19/14  Yes Waldemar Dickens, MD  ibuprofen (ADVIL,MOTRIN) 200 MG tablet Take 800 mg by mouth every 4 (four) hours as needed (pain).   Yes Historical Provider, MD  levothyroxine (SYNTHROID, LEVOTHROID) 150 MCG tablet Take 150 mcg by mouth daily before breakfast.   Yes Historical Provider, MD  losartan-hydrochlorothiazide (HYZAAR) 100-25 MG per tablet Take 1 tablet by mouth daily.   Yes Historical Provider, MD  omeprazole (PRILOSEC) 20 MG capsule Take 20 mg by mouth daily before lunch.    Yes Historical Provider, MD   BP 149/72 mmHg  Pulse 78  Temp(Src) 98.1 F (36.7 C) (Oral)  Resp 16  SpO2 99% Physical Exam  Constitutional: She is oriented to person, place, and time. She appears well-developed and well-nourished. No distress.  HENT:  Head: Normocephalic and atraumatic.  Cardiovascular: Normal rate, regular rhythm, normal heart sounds and intact distal pulses.  Exam reveals no gallop and no friction rub.   No murmur heard. Pulmonary/Chest: Effort normal and breath sounds normal. No respiratory distress. She has no wheezes. She has no rales. She exhibits no tenderness.  Abdominal: Soft. Bowel sounds are normal. She exhibits no distension and no mass. There is no tenderness. There is no rebound and no guarding.  Musculoskeletal: Normal range of motion. She exhibits no edema or tenderness.  Lymphadenopathy:    She has no cervical adenopathy.  Neurological: She is alert and oriented to person, place, and time. No cranial nerve deficit. Coordination normal.  Skin: Skin is warm and dry. No rash noted. She is not diaphoretic.  Nursing note and vitals reviewed.   ED Course  Procedures (including critical care time) Labs Review Labs  Reviewed  BASIC METABOLIC PANEL - Abnormal; Notable for the following:    Glucose, Bld 102 (*)    Calcium 8.7 (*)    All other components within normal limits  CBC  I-STAT TROPOININ, ED  Randolm Idol, ED    Imaging Review Dg Chest 2 View  02/24/2015  CLINICAL DATA:  Chest pain.  Shortness of breath. EXAM: CHEST  2 VIEW COMPARISON:  07/08/2009 FINDINGS: The heart size and mediastinal contours are within normal limits. Both lungs are clear. The visualized skeletal structures are unremarkable. IMPRESSION: Normal exam. Electronically Signed   By: Lorriane Shire M.D.   On: 02/24/2015 17:00   I have personally reviewed and evaluated these images and lab results as part of my medical decision-making.   EKG Interpretation None      ED ECG REPORT   Date: 02/24/2015  Rate: 85  Rhythm: normal sinus rhythm  QRS Axis: normal  Intervals: normal  ST/T Wave abnormalities: normal  Conduction Disutrbances:none  Narrative Interpretation:   Old EKG Reviewed: unchanged  I have personally reviewed the  EKG tracing and agree with the computerized printout as noted.   MDM   Final diagnoses:  Chest pain, unspecified chest pain type   55 yo F w/CP. See HPI for details. On exam, pt in NAD, AFVSS. PE benign and as above. CP is not reproducible. Sx concerning for ACS. Considered but not c/w DVT/PE, Wells score of 0. Also not c/w PTX, dissection, pericarditis.   Pain resolved w/ 3 more SL nitro. Concerning for ACS.   Initial trop 0.01. Given HEAR score 4, will admit for cont'd obs and trop trends.    Pt was seen under the supervision of Dr. Kathrynn Humble.     Sherian Maroon, MD 02/24/15 1950  Varney Biles, MD 02/27/15 [phone]

## 2015-02-24 NOTE — ED Notes (Signed)
Attempted report 

## 2015-02-24 NOTE — ED Notes (Signed)
To room via EMS.  Onset 40 min PTA pt was laying in bed and felt squeezing pain in center chest and right jaw, after several mins pt got out of bed and felt dizzy and double vision, mild shortness of breath.  Pt took ASA 324 mg.  When fire arrived BP 210/130 pain 8/10, EMS arrived BP 190/169m, gave NTG x 2, pain decreased to 5/10.  Pt felt nauseous on way EMS gave Zofran 4mg .  EKG NSR.

## 2015-02-25 ENCOUNTER — Other Ambulatory Visit (HOSPITAL_COMMUNITY): Payer: Commercial Managed Care - HMO

## 2015-02-25 DIAGNOSIS — R079 Chest pain, unspecified: Secondary | ICD-10-CM

## 2015-02-25 DIAGNOSIS — E038 Other specified hypothyroidism: Secondary | ICD-10-CM

## 2015-02-25 DIAGNOSIS — I160 Hypertensive urgency: Secondary | ICD-10-CM

## 2015-02-25 DIAGNOSIS — R0789 Other chest pain: Principal | ICD-10-CM

## 2015-02-25 DIAGNOSIS — E785 Hyperlipidemia, unspecified: Secondary | ICD-10-CM

## 2015-02-25 LAB — LIPID PANEL
CHOL/HDL RATIO: 5.5 ratio
CHOLESTEROL: 197 mg/dL (ref 0–200)
HDL: 36 mg/dL — AB (ref >40)
LDL Cholesterol: 120 mg/dL — ABNORMAL HIGH (ref 0–99)
TRIGLYCERIDES: 204 mg/dL — AB (ref <150)
VLDL: 41 mg/dL — ABNORMAL HIGH (ref 0–40)

## 2015-02-25 LAB — BASIC METABOLIC PANEL
ANION GAP: 10 (ref 5–15)
BUN: 8 mg/dL (ref 6–20)
CALCIUM: 8.7 mg/dL — AB (ref 8.9–10.3)
CHLORIDE: 102 mmol/L (ref 101–111)
CO2: 25 mmol/L (ref 22–32)
CREATININE: 0.72 mg/dL (ref 0.44–1.00)
GFR calc non Af Amer: >60 mL/min (ref >60)
Glucose, Bld: 93 mg/dL (ref 65–99)
Potassium: 3.4 mmol/L — ABNORMAL LOW (ref 3.5–5.1)
SODIUM: 137 mmol/L (ref 135–145)

## 2015-02-25 LAB — CBC
HCT: 39.7 % (ref 36.0–46.0)
HEMOGLOBIN: 13.1 g/dL (ref 12.0–15.0)
MCH: 30.3 pg (ref 26.0–34.0)
MCHC: 33 g/dL (ref 30.0–36.0)
MCV: 91.7 fL (ref 78.0–100.0)
PLATELETS: 339 10*3/uL (ref 150–400)
RBC: 4.33 MIL/uL (ref 3.87–5.11)
RDW: 13.1 % (ref 11.5–15.5)
WBC: 6.3 10*3/uL (ref 4.0–10.5)

## 2015-02-25 LAB — RAPID URINE DRUG SCREEN, HOSP PERFORMED
AMPHETAMINES: NOT DETECTED
BENZODIAZEPINES: NOT DETECTED
Barbiturates: NOT DETECTED
COCAINE: NOT DETECTED
OPIATES: NOT DETECTED
Tetrahydrocannabinol: NOT DETECTED

## 2015-02-25 LAB — TROPONIN I: Troponin I: <0.03 ng/mL (ref <0.031)

## 2015-02-25 LAB — TSH: TSH: 0.741 u[IU]/mL (ref 0.350–4.500)

## 2015-02-25 MED ORDER — HYDROCHLOROTHIAZIDE 25 MG PO TABS
25.0000 mg | ORAL_TABLET | Freq: Every day | ORAL | Status: DC
  Administered 2015-02-25: 25 mg via ORAL
  Filled 2015-02-25: qty 1

## 2015-02-25 MED ORDER — ATORVASTATIN CALCIUM 40 MG PO TABS: 40.0000 mg | ORAL_TABLET | Freq: Every day | ORAL | Status: DC

## 2015-02-25 MED ORDER — POTASSIUM CHLORIDE CRYS ER 20 MEQ PO TBCR
40.0000 meq | EXTENDED_RELEASE_TABLET | Freq: Once | ORAL | Status: AC
  Administered 2015-02-25: 40 meq via ORAL
  Filled 2015-02-25: qty 2

## 2015-02-25 MED ORDER — ASPIRIN EC 81 MG PO TBEC: 81.0000 mg | DELAYED_RELEASE_TABLET | Freq: Every day | ORAL | Status: DC

## 2015-02-25 MED ORDER — LOSARTAN POTASSIUM 50 MG PO TABS
100.0000 mg | ORAL_TABLET | Freq: Every day | ORAL | Status: DC
  Administered 2015-02-25: 100 mg via ORAL
  Filled 2015-02-25: qty 2

## 2015-02-25 MED ORDER — NITROGLYCERIN 0.4 MG SL SUBL: 0.4000 mg | SUBLINGUAL_TABLET | SUBLINGUAL | Status: DC | PRN

## 2015-02-25 MED ORDER — AMLODIPINE BESYLATE 10 MG PO TABS
10.0000 mg | ORAL_TABLET | Freq: Every day | ORAL | Status: DC
  Administered 2015-02-25: 10 mg via ORAL
  Filled 2015-02-25: qty 1

## 2015-02-25 MED ORDER — ONDANSETRON HCL 4 MG/2ML IJ SOLN
4.0000 mg | Freq: Once | INTRAMUSCULAR | Status: AC
Start: 2015-02-25 — End: 2015-02-25
  Administered 2015-02-25: 4 mg via INTRAVENOUS

## 2015-02-25 MED ORDER — CARVEDILOL 12.5 MG PO TABS: 12.5000 mg | ORAL_TABLET | Freq: Two times a day (BID) | ORAL | Status: DC

## 2015-02-25 MED ORDER — AMLODIPINE BESYLATE 10 MG PO TABS: 10.0000 mg | ORAL_TABLET | Freq: Every day | ORAL | Status: DC

## 2015-02-25 MED ORDER — LOSARTAN POTASSIUM-HCTZ 100-25 MG PO TABS: 1.0000 | ORAL_TABLET | Freq: Every day | ORAL | Status: DC

## 2015-02-25 NOTE — Discharge Summary (Signed)
Physician Discharge Summary  Sheri Villarreal [id number] DOB: March 02, 1960 DOA: 02/24/2015  PCP: Gerrit Heck, MD  Admit date: 02/24/2015 Discharge date: 02/25/2015  Time spent: 45 minutes  Recommendations for Outpatient Follow-up:  1. Will need a stress test- cardiology will call her  Discharge Condition: stable    Discharge Diagnoses:  Principal Problem:   Chest pain Active Problems:   GERD (gastroesophageal reflux disease)   Hypertensive urgency   Arthritis   Depression   Hypothyroidism   History of present illness:  Sheri Villarreal is a 55 y.o. female with PMH of hypertension, GERD, secondary hypothyroidism (patient had history of Graves' disease, S/P of radiation), arthritis, GERD, depression, smoker who presents with chest pain.   Hospital Course:  Chest pain - has ruled out for MI- per cardiology eval today, can have a stress test as outpt - have given a prescription for Nitro in case the pain recurs - have discussed modifying her risk factors including controlling her lipids, HTN, weight and stopping smoking  Hypertensive urgency - patient states her usual SBP is < 150- presented with SBP> 200 -BP improved- have added Amlodipine - she is advised to f/u BP at home  Dyslipidemia - statin started- low cholesterol diet recommended  Hypokalemia - replaced today  Nicotine abuse - she will try to stop smoking- she states she does not need nicotine patches  Obesity Body mass index is 40.59 kg/(m^2).- understands that she needs to work on losing weight  Consultations:  Cardiology   Discharge Exam: [facility] Weights   02/24/15 2044 02/25/15 0519  Weight: 103.602 kg (228 lb 6.4 oz) 103.919 kg (229 lb 1.6 oz)   Filed Vitals:   02/25/15 0519 02/25/15 1120  BP: 175/91 157/81  Pulse: 91 80  Temp: 98 F (36.7 C) 98.6 F (37 C)  Resp: 16 16    General: AAO x 3, no distress Cardiovascular: RRR, no murmurs  Respiratory: clear to auscultation  bilaterally GI: soft, non-tender, non-distended, bowel sound positive  Discharge Instructions You were cared for by a hospitalist during your hospital stay. If you have any questions about your discharge medications or the care you received while you were in the hospital after you are discharged, you can call the unit and asked to speak with the hospitalist on call if the hospitalist that took care of you is not available. Once you are discharged, your primary care physician will handle any further medical issues. Please note that NO REFILLS for any discharge medications will be authorized once you are discharged, as it is imperative that you return to your primary care physician (or establish a relationship with a primary care physician if you do not have one) for your aftercare needs so that they can reassess your need for medications and monitor your lab values.      Discharge Instructions    Diet - low sodium heart healthy    Complete by:  As directed      Increase activity slowly    Complete by:  As directed             Medication List    STOP taking these medications        ibuprofen 200 MG tablet  Commonly known as:  ADVIL,MOTRIN      TAKE these medications        acetaminophen 500 MG tablet  Commonly known as:  TYLENOL  Take 1,000 mg by mouth every 6 (six) hours as needed for mild pain.  amLODipine 10 MG tablet  Commonly known as:  NORVASC  Take 1 tablet (10 mg total) by mouth daily.     aspirin EC 81 MG tablet  Take 1 tablet (81 mg total) by mouth daily.     atorvastatin 40 MG tablet  Commonly known as:  LIPITOR  Take 1 tablet (40 mg total) by mouth daily at 6 PM.     buPROPion 150 MG 24 hr tablet  Commonly known as:  WELLBUTRIN XL  Take 150 mg by mouth daily.     diclofenac 75 MG EC tablet  Commonly known as:  VOLTAREN  Take 75 mg by mouth at bedtime.     escitalopram 10 MG tablet  Commonly known as:  LEXAPRO  Take 10 mg by mouth daily.      fluticasone 50 MCG/ACT nasal spray  Commonly known as:  FLONASE  Place 2 sprays into both nostrils at bedtime.     levothyroxine 150 MCG tablet  Commonly known as:  SYNTHROID, LEVOTHROID  Take 150 mcg by mouth daily before breakfast.     losartan-hydrochlorothiazide 100-25 MG tablet  Commonly known as:  HYZAAR  Take 1 tablet by mouth daily.     nitroGLYCERIN 0.4 MG SL tablet  Commonly known as:  NITROSTAT  Place 1 tablet (0.4 mg total) under the tongue every 5 (five) minutes as needed for chest pain.     omeprazole 20 MG capsule  Commonly known as:  PRILOSEC  Take 20 mg by mouth daily before lunch.       Allergies  Allergen Reactions  . Hydrocodone-Homatropine Itching    Reaction to Hycodan cough syrup (pt has taken norco in the past with no reaction)      The results of significant diagnostics from this hospitalization (including imaging, microbiology, ancillary and laboratory) are listed below for reference.    Significant Diagnostic Studies: Dg Chest 2 View  02/24/2015  CLINICAL DATA:  Chest pain.  Shortness of breath. EXAM: CHEST  2 VIEW COMPARISON:  07/08/2009 FINDINGS: The heart size and mediastinal contours are within normal limits. Both lungs are clear. The visualized skeletal structures are unremarkable. IMPRESSION: Normal exam. Electronically Signed   By: Lorriane Shire M.D.   On: 02/24/2015 17:00    Microbiology: No results found for this or any previous visit (from the past 240 hour(s)).   Labs: Basic Metabolic Panel:  Recent Labs Lab 02/24/15 1636 02/25/15 0220  NA 138 137  K 3.8 3.4*  CL 105 102  CO2 24 25  GLUCOSE 102* 93  BUN 9 8  CREATININE 0.64 0.72  CALCIUM 8.7* 8.7*   Liver Function Tests: No results for input(s): AST, ALT, ALKPHOS, BILITOT, PROT, ALBUMIN in the last 168 hours. No results for input(s): LIPASE, AMYLASE in the last 168 hours. No results for input(s): AMMONIA in the last 168 hours. CBC:  Recent Labs Lab 02/24/15 1636  02/25/15 0220  WBC 7.1 6.3  HGB 13.9 13.1  HCT 41.9 39.7  MCV 91.1 91.7  PLT 376 339   Cardiac Enzymes:  Recent Labs Lab 02/24/15 2112 02/25/15 0220 02/25/15 0800  TROPONINI <0.03 <0.03 <0.03   BNP: BNP (last 3 results) No results for input(s): BNP in the last 8760 hours.  ProBNP (last 3 results) No results for input(s): PROBNP in the last 8760 hours.  CBG: No results for input(s): GLUCAP in the last 168 hours.     SignedDebbe Odea, MD [facility] 02/25/2015, 11:57 AM

## 2015-02-25 NOTE — Care Management Note (Signed)
Case Management Note  Patient Details  Name: Corianne Werder MRN: [id number] Date of Birth: [birth year]  Subjective/Objective:   Pt admitted for chest pian.                  Action/Plan: No needs identified By CM at this time.    Expected Discharge Date:                  Expected Discharge Plan:     In-House Referral:     Discharge planning Services     Post Acute Care Choice:    Choice offered to:     DME Arranged:    DME Agency:     HH Arranged:    [place] Agency:     Status of Service:     Medicare Important Message Given:    Date Medicare IM Given:    Medicare IM give by:    Date Additional Medicare IM Given:    Additional Medicare Important Message give by:     If discussed at [place] of Stay Meetings, dates discussed:    Additional Comments:  Bethena Roys, RN 02/25/2015, 11:53 AM

## 2015-02-25 NOTE — Progress Notes (Signed)
UR Completed Makella Buckingham Graves-Bigelow, RN,BSN [phone]  

## 2015-02-25 NOTE — Consult Note (Addendum)
CONSULTATION NOTE  Reason for Consult: Chest pain  Requesting Physician: Dr. Wynelle Cleveland  Cardiologist: None (NEW)  HPI: This is a 55 y.o. female with a past medical history significant for hypertension, GERD, Graves' disease status post radiation therapy to the thyroid, tobacco abuse, and family history of hypertension and diabetes but no coronary disease. She presents with chest pain which occurred while lying in bed yesterday. It felt like a crampy pain in the center of her chest. It did not radiate to her arm or her jaw. She said it was distinctly different than her reflux symptoms. She is also recently had decreased appetite and some nausea. She denies feeling that food is getting stuck in her chest and is not thrown up anything. As the pain persisted, she called EMS. On arrival she was markedly hypertensive with blood pressure over 767 systolic. She apparently was given up to 6 nitroglycerin with eventual relief of her chest pain symptoms after 20-30 minutes. Blood pressure then came down to the 170s. She reports she's been compliant with her blood pressure medicine and it has been fairly stable for the past 5 years. Did have a stress echocardiogram in May 2011 for similar chest pain although it was less severe. This was negative for ischemic wall motion abnormalities. Currently she reports her chest pressure is only 1 out of 10. She is experiencing headache.  PMHx:  Past Medical History  Diagnosis Date  . GERD (gastroesophageal reflux disease)   . History of hyperthyroidism   . Depression   . Carpal tunnel syndrome of right wrist 06/2013  . Hypertension     under control with med., has been on med. x 9 yr.  . Dental crowns present   . Arthritis     knees  . Tobacco abuse    Past Surgical History  Procedure Laterality Date  . Appendectomy    . Inguinal hernia repair    . Knee arthroscopy Right   . Carpal tunnel release Right 07/04/2013    Procedure: RIGHT CARPAL TUNNEL RELEASE;   Surgeon: Wynonia Sours, MD;  Location: [place];  Service: Orthopedics;  Laterality: Right;    FAMHx: Family History  Problem Relation Age of Onset  . Hypertension Mother   . Hypertension Father   . Kidney disease Father   . Diverticulosis Father   . Diabetes Sister   . Diabetes Brother     SOCHx:  reports that she has been smoking Cigarettes.  She has smoked for the past 30 years. She has never used smokeless tobacco. She reports that she drinks alcohol. She reports that she does not use illicit drugs.  ALLERGIES: Allergies  Allergen Reactions  . Hydrocodone-Homatropine Itching    Reaction to Hycodan cough syrup (pt has taken norco in the past with no reaction)    ROS: A comprehensive review of systems was negative except for: Constitutional: positive for anorexia Cardiovascular: positive for chest pain Gastrointestinal: positive for dyspepsia and reflux symptoms Neurological: positive for headaches  HOME MEDICATIONS:   Medication List    ASK your doctor about these medications        acetaminophen 500 MG tablet  Commonly known as:  TYLENOL  Take 1,000 mg by mouth every 6 (six) hours as needed for mild pain.     buPROPion 150 MG 24 hr tablet  Commonly known as:  WELLBUTRIN XL  Take 150 mg by mouth daily.     diclofenac 75 MG EC tablet  Commonly known  as:  VOLTAREN  Take 75 mg by mouth at bedtime.     escitalopram 10 MG tablet  Commonly known as:  LEXAPRO  Take 10 mg by mouth daily.     fluticasone 50 MCG/ACT nasal spray  Commonly known as:  FLONASE  Place 2 sprays into both nostrils at bedtime.     ibuprofen 200 MG tablet  Commonly known as:  ADVIL,MOTRIN  Take 800 mg by mouth every 4 (four) hours as needed (pain).     levothyroxine 150 MCG tablet  Commonly known as:  SYNTHROID, LEVOTHROID  Take 150 mcg by mouth daily before breakfast.     losartan-hydrochlorothiazide 100-25 MG tablet  Commonly known as:  HYZAAR  Take 1 tablet by  mouth daily.     omeprazole 20 MG capsule  Commonly known as:  PRILOSEC  Take 20 mg by mouth daily before lunch.        HOSPITAL MEDICATIONS: I have reviewed the patient's current medications.  VITALS: Blood pressure 175/91, pulse 91, temperature 98 F (36.7 C), temperature source Oral, resp. rate 16, height _0  (1.6 m), weight 229 lb 1.6 oz (103.919 kg), SpO2 95 %.  PHYSICAL EXAM: General appearance: alert and no distress Neck: no carotid bruit and no JVD Lungs: clear to auscultation bilaterally Heart: regular rate and rhythm, S1, S2 normal, no murmur, click, rub or gallop Abdomen: soft, non-tender; bowel sounds normal; no masses,  no organomegaly Extremities: extremities normal, atraumatic, no cyanosis or edema Pulses: 2+ and symmetric Skin: Skin color, texture, turgor normal. No rashes or lesions Neurologic: Grossly normal Psych: Pleasant, mildly anxious  LABS: Results for orders placed or performed during the hospital encounter of 02/24/15 (from the past 48 hour(s))  Basic metabolic panel     Status: Abnormal   Collection Time: 02/24/15  4:36 PM  Result Value Ref Range   Sodium 138 135 - 145 mmol/L   Potassium 3.8 3.5 - 5.1 mmol/L   Chloride 105 101 - 111 mmol/L   CO2 24 22 - 32 mmol/L   Glucose, Bld 102 (H) 65 - 99 mg/dL   BUN 9 6 - 20 mg/dL   Creatinine, Ser 0.64 0.44 - 1.00 mg/dL   Calcium 8.7 (L) 8.9 - 10.3 mg/dL   GFR calc non Af Amer >60 >60 mL/min   GFR calc Af Amer >60 >60 mL/min    Comment: (NOTE) The eGFR has been calculated using the CKD EPI equation. This calculation has not been validated in all clinical situations. eGFR's persistently <60 mL/min signify possible Chronic Kidney Disease.    Anion gap 9 5 - 15  CBC     Status: None   Collection Time: 02/24/15  4:36 PM  Result Value Ref Range   WBC 7.1 4.0 - 10.5 K/uL   RBC 4.60 3.87 - 5.11 MIL/uL   Hemoglobin 13.9 12.0 - 15.0 g/dL   HCT 41.9 36.0 - 46.0 %   MCV 91.1 78.0 - 100.0 fL   MCH 30.2  26.0 - 34.0 pg   MCHC 33.2 30.0 - 36.0 g/dL   RDW 13.1 11.5 - 15.5 %   Platelets 376 150 - 400 K/uL  I-stat troponin, ED (not at [facility])     Status: None   Collection Time: 02/24/15  4:39 PM  Result Value Ref Range   Troponin i, poc 0.01 0.00 - 0.08 ng/mL   Comment 3            Comment: Due to the release kinetics  of cTnI, a negative result within the first hours of the onset of symptoms does not rule out myocardial infarction with certainty. If myocardial infarction is still suspected, repeat the test at appropriate intervals.   I-Stat Troponin, ED (not at [facility])     Status: None   Collection Time: 02/24/15  7:39 PM  Result Value Ref Range   Troponin i, poc 0.01 0.00 - 0.08 ng/mL   Comment 3            Comment: Due to the release kinetics of cTnI, a negative result within the first hours of the onset of symptoms does not rule out myocardial infarction with certainty. If myocardial infarction is still suspected, repeat the test at appropriate intervals.   Troponin I     Status: None   Collection Time: 02/24/15  9:12 PM  Result Value Ref Range   Troponin I <0.03 <0.031 ng/mL    Comment:        NO INDICATION OF MYOCARDIAL INJURY.   Protime-INR     Status: None   Collection Time: 02/24/15  9:12 PM  Result Value Ref Range   Prothrombin Time 13.0 11.6 - 15.2 seconds   INR 0.97 0.00 - 1.49  APTT     Status: None   Collection Time: 02/24/15  9:12 PM  Result Value Ref Range   aPTT 31 24 - 37 seconds  TSH     Status: None   Collection Time: 02/25/15  2:20 AM  Result Value Ref Range   TSH 0.741 0.350 - 4.500 uIU/mL  Troponin I     Status: None   Collection Time: 02/25/15  2:20 AM  Result Value Ref Range   Troponin I <0.03 <0.031 ng/mL    Comment:        NO INDICATION OF MYOCARDIAL INJURY.   Basic metabolic panel     Status: Abnormal   Collection Time: 02/25/15  2:20 AM  Result Value Ref Range   Sodium 137 135 - 145 mmol/L   Potassium 3.4 (L) 3.5 - 5.1 mmol/L    Chloride 102 101 - 111 mmol/L   CO2 25 22 - 32 mmol/L   Glucose, Bld 93 65 - 99 mg/dL   BUN 8 6 - 20 mg/dL   Creatinine, Ser 0.72 0.44 - 1.00 mg/dL   Calcium 8.7 (L) 8.9 - 10.3 mg/dL   GFR calc non Af Amer >60 >60 mL/min   GFR calc Af Amer >60 >60 mL/min    Comment: (NOTE) The eGFR has been calculated using the CKD EPI equation. This calculation has not been validated in all clinical situations. eGFR's persistently <60 mL/min signify possible Chronic Kidney Disease.    Anion gap 10 5 - 15  CBC     Status: None   Collection Time: 02/25/15  2:20 AM  Result Value Ref Range   WBC 6.3 4.0 - 10.5 K/uL   RBC 4.33 3.87 - 5.11 MIL/uL   Hemoglobin 13.1 12.0 - 15.0 g/dL   HCT 39.7 36.0 - 46.0 %   MCV 91.7 78.0 - 100.0 fL   MCH 30.3 26.0 - 34.0 pg   MCHC 33.0 30.0 - 36.0 g/dL   RDW 13.1 11.5 - 15.5 %   Platelets 339 150 - 400 K/uL  Lipid panel     Status: Abnormal   Collection Time: 02/25/15  2:20 AM  Result Value Ref Range   Cholesterol 197 0 - 200 mg/dL   Triglycerides 204 (H) <150 mg/dL  HDL 36 (L) >40 mg/dL   Total CHOL/HDL Ratio 5.5 RATIO   VLDL 41 (H) 0 - 40 mg/dL   LDL Cholesterol 120 (H) 0 - 99 mg/dL    Comment:        Total Cholesterol/HDL:CHD Risk Coronary Heart Disease Risk Table                     Men   Women  1/2 Average Risk   3.4   3.3  Average Risk       5.0   4.4  2 X Average Risk   9.6   7.1  3 X Average Risk  23.4   11.0        Use the calculated Patient Ratio above and the CHD Risk Table to determine the patient's CHD Risk.        ATP III CLASSIFICATION (LDL):  <100     mg/dL   Optimal  100-129  mg/dL   Near or Above                    Optimal  130-159  mg/dL   Borderline  160-189  mg/dL   High  >190     mg/dL   Very High   Urine rapid drug screen (hosp performed)     Status: None   Collection Time: 02/25/15  5:43 AM  Result Value Ref Range   Opiates NONE DETECTED NONE DETECTED   Cocaine NONE DETECTED NONE DETECTED   Benzodiazepines NONE  DETECTED NONE DETECTED   Amphetamines NONE DETECTED NONE DETECTED   Tetrahydrocannabinol NONE DETECTED NONE DETECTED   Barbiturates NONE DETECTED NONE DETECTED    Comment:        DRUG SCREEN FOR MEDICAL PURPOSES ONLY.  IF CONFIRMATION IS NEEDED FOR ANY PURPOSE, NOTIFY LAB WITHIN 5 DAYS.        LOWEST DETECTABLE LIMITS FOR URINE DRUG SCREEN Drug Class       Cutoff (ng/mL) Amphetamine      1000 Barbiturate      200 Benzodiazepine   546 Tricyclics       270 Opiates          300 Cocaine          300 THC              50   Troponin I     Status: None   Collection Time: 02/25/15  8:00 AM  Result Value Ref Range   Troponin I <0.03 <0.031 ng/mL    Comment:        NO INDICATION OF MYOCARDIAL INJURY.     IMAGING: Dg Chest 2 View  02/24/2015  CLINICAL DATA:  Chest pain.  Shortness of breath. EXAM: CHEST  2 VIEW COMPARISON:  07/08/2009 FINDINGS: The heart size and mediastinal contours are within normal limits. Both lungs are clear. The visualized skeletal structures are unremarkable. IMPRESSION: Normal exam. Electronically Signed   By: Lorriane Shire M.D.   On: 02/24/2015 17:00   EKG: Normal sinus rhythm with lateral nonspecific T-wave changes that have resolved  HOSPITAL DIAGNOSES: Principal Problem:   Chest pain Active Problems:   GERD (gastroesophageal reflux disease)   Hypertensive urgency   Arthritis   Depression   Hypothyroidism   IMPRESSION: 1. Hypertensive urgency 2. Chest pain secondary to #1 3. Dyslipidemia  RECOMMENDATION: 1. Sheri Villarreal presented with hypertensive urgency without a clear etiology. She's actually had decreased intake over the past several days and some  nausea but no dysphasia. She reports compliance with her blood pressure medicine. Chest pain improved slowly over a long period of time after menstruation nitroglycerin and controlled blood pressure. Troponin was negative 5 overnight. EKG shows mild nonspecific changes which are resolved. For now,  recommend increased antihypertensive medication. She has been started on carvedilol in addition to Hyzaar. Blood pressure this morning was still 170. I would recommend starting amlodipine 10 mg daily and discontinuing carvedilol. I'm happy to see her back in the office in follow-up and would recommend outpatient exercise stress testing when her blood pressure is better controlled.  Thanks for the consultation.  Time Spent Directly with Patient: 35 minutes  Pixie Casino, MD, [facility] Attending Cardiologist [place] 02/25/2015, 9:31 AM

## 2015-02-26 LAB — HEMOGLOBIN A1C
Hgb A1c MFr Bld: 5.7 % — ABNORMAL HIGH (ref 4.8–5.6)
Mean Plasma Glucose: 117 mg/dL

## 2015-09-12 ENCOUNTER — Ambulatory Visit (HOSPITAL_COMMUNITY)
Admission: EM | Admit: 2015-09-12 | Discharge: 2015-09-12 | Disposition: A | Discharge status: Home / Self Care | Payer: Commercial Managed Care - HMO | Attending: Emergency Medicine | Admitting: Emergency Medicine

## 2015-09-12 ENCOUNTER — Encounter (HOSPITAL_COMMUNITY): Payer: Self-pay | Admitting: *Deleted

## 2015-09-12 DIAGNOSIS — S39012A Strain of muscle, fascia and tendon of lower back, initial encounter: Secondary | ICD-10-CM

## 2015-09-12 MED ORDER — PREDNISONE 10 MG (21) PO TBPK: 10.0000 mg | ORAL_TABLET | Freq: Every day | ORAL | Status: DC

## 2015-09-12 MED ORDER — METHOCARBAMOL 500 MG PO TABS: 500.0000 mg | ORAL_TABLET | Freq: Three times a day (TID) | ORAL | Status: DC | PRN

## 2015-09-12 NOTE — ED Notes (Signed)
Pt  Reports     Low- mid  Back  Pain        With  Symptoms  X  This  Afternoon      denys  Any specefic injurty  Ambulated  To room  With  Steady fluid gait          denys  Any urinary symotoms          Pain is worse  On  Movements  And  posistions

## 2015-09-12 NOTE — Discharge Instructions (Signed)
Start Prednisone as directed. Use Robaxin (muscle relaxer) as needed- especially at night. Apply warm heat to area as needed. Activities as tolerated. Follow-up with your primary care provider if symptoms do not improve.   Lumbosacral Strain Lumbosacral strain is a strain of any of the parts that make up your lumbosacral vertebrae. Your lumbosacral vertebrae are the bones that make up the lower third of your backbone. Your lumbosacral vertebrae are held together by muscles and tough, fibrous tissue (ligaments).  CAUSES  A sudden blow to your back can cause lumbosacral strain. Also, anything that causes an excessive stretch of the muscles in the low back can cause this strain. This is typically seen when people exert themselves strenuously, fall, lift heavy objects, bend, or crouch repeatedly. RISK FACTORS  Physically demanding work.  Participation in pushing or pulling sports or sports that require a sudden twist of the back (tennis, golf, baseball).  Weight lifting.  Excessive lower back curvature.  Forward-tilted pelvis.  Weak back or abdominal muscles or both.  Tight hamstrings. SIGNS AND SYMPTOMS  Lumbosacral strain may cause pain in the area of your injury or pain that moves (radiates) down your leg.  DIAGNOSIS Your health care provider can often diagnose lumbosacral strain through a physical exam. In some cases, you may need tests such as X-ray exams.  TREATMENT  Treatment for your lower back injury depends on many factors that your clinician will have to evaluate. However, most treatment will include the use of anti-inflammatory medicines. HOME CARE INSTRUCTIONS   Avoid hard physical activities (tennis, racquetball, waterskiing) if you are not in proper physical condition for it. This may aggravate or create problems.  If you have a back problem, avoid sports requiring sudden body movements. Swimming and walking are generally safer activities.  Maintain good  posture.  Maintain a healthy weight.  For acute conditions, you may put ice on the injured area.  Put ice in a plastic bag.  Place a towel between your skin and the bag.  Leave the ice on for 20 minutes, 2-3 times a day.  When the low back starts healing, stretching and strengthening exercises may be recommended. SEEK MEDICAL CARE IF:  Your back pain is getting worse.  You experience severe back pain not relieved with medicines. SEEK IMMEDIATE MEDICAL CARE IF:   You have numbness, tingling, weakness, or problems with the use of your arms or legs.  There is a change in bowel or bladder control.  You have increasing pain in any area of the body, including your belly (abdomen).  You notice shortness of breath, dizziness, or feel faint.  You feel sick to your stomach (nauseous), are throwing up (vomiting), or become sweaty.  You notice discoloration of your toes or legs, or your feet get very cold. MAKE SURE YOU:   Understand these instructions.  Will watch your condition.  Will get help right away if you are not doing well or get worse.   This information is not intended to replace advice given to you by your health care provider. Make sure you discuss any questions you have with your health care provider.   Document Released: 11/19/2004 Document Revised: 03/02/2014 Document Reviewed: 09/28/2012 Elsevier Interactive Patient Education [employer].

## 2015-09-13 NOTE — ED Provider Notes (Signed)
CSN: [id number]     Arrival date & time 09/12/15  1357 History   First MD Initiated Contact with Patient 09/12/15 1453     Chief Complaint  Patient presents with  . Back Pain   (Consider location/radiation/quality/duration/timing/severity/associated sxs/prior Treatment) HPI Comments: Patient presents with lower back pain that has flared up in the past 2 days. She has a history of recurrent back pain with flare-ups every 1-2 years. She has been very active with her grandkids last week and feels the stress and activity level has caused mild injury to her back. Today she is having more pain and difficulty changing positions. She has a history of arthritis and is currently on an anti-inflammatory which is not helping her back pain. She has taken muscle relaxants and Prednisone in the past with good success.   Patient is a 55 y.o. female presenting with back pain. The history is provided by the patient.  Back Pain Location:  Lumbar spine Quality:  Stiffness and shooting Stiffness is present:  All day Radiates to:  Does not radiate Pain severity:  Moderate Pain is:  Same all the time Onset quality:  Gradual Duration:  2 days Timing:  Constant Progression:  Unchanged Chronicity:  Recurrent Context: physical stress   Relieved by:  Nothing Ineffective treatments:  NSAIDs and bed rest Associated symptoms: no bladder incontinence, no bowel incontinence, no fever, no leg pain, no numbness and no weakness     Past Medical History  Diagnosis Date  . GERD (gastroesophageal reflux disease)   . History of hyperthyroidism   . Depression   . Carpal tunnel syndrome of right wrist 06/2013  . Hypertension     under control with med., has been on med. x 9 yr.  . Dental crowns present   . Arthritis     knees  . Tobacco abuse    Past Surgical History  Procedure Laterality Date  . Appendectomy    . Inguinal hernia repair    . Knee arthroscopy Right   . Carpal tunnel release Right 07/04/2013   Procedure: RIGHT CARPAL TUNNEL RELEASE;  Surgeon: Wynonia Sours, MD;  Location: [place];  Service: Orthopedics;  Laterality: Right;   Family History  Problem Relation Age of Onset  . Hypertension Mother   . Hypertension Father   . Kidney disease Father   . Diverticulosis Father   . Diabetes Sister   . Diabetes Brother    Social History  Substance Use Topics  . Smoking status: Current Every Day Smoker -- 30 years    Types: Cigarettes  . Smokeless tobacco: Never Used     Comment: 7 cig./day  . Alcohol Use: Yes     Comment: infrequently   OB History    No data available     Review of Systems  Constitutional: Negative for fever.  Gastrointestinal: Negative for bowel incontinence.  Genitourinary: Negative for bladder incontinence and difficulty urinating.  Musculoskeletal: Positive for back pain.  Neurological: Negative for weakness and numbness.    Allergies  Hydrocodone-homatropine  Home Medications   Prior to Admission medications   Medication Sig Start Date End Date Taking? Authorizing Provider  acetaminophen (TYLENOL) 500 MG tablet Take 1,000 mg by mouth every 6 (six) hours as needed for mild pain.    Historical Provider, MD  amLODipine (NORVASC) 10 MG tablet Take 1 tablet (10 mg total) by mouth daily. 02/25/15   Debbe Odea, MD  aspirin EC 81 MG tablet Take 1 tablet (81 mg  total) by mouth daily. 02/25/15   Debbe Odea, MD  atorvastatin (LIPITOR) 40 MG tablet Take 1 tablet (40 mg total) by mouth daily at 6 PM. 02/25/15   Debbe Odea, MD  buPROPion (WELLBUTRIN XL) 150 MG 24 hr tablet Take 150 mg by mouth daily.    Historical Provider, MD  diclofenac (VOLTAREN) 75 MG EC tablet Take 75 mg by mouth at bedtime.     Historical Provider, MD  escitalopram (LEXAPRO) 10 MG tablet Take 10 mg by mouth daily.    Historical Provider, MD  fluticasone (FLONASE) 50 MCG/ACT nasal spray Place 2 sprays into both nostrils at bedtime. Patient taking differently: Place 2 sprays  into both nostrils 2 (two) times daily as needed (head/ear congestion).  08/19/14   Waldemar Dickens, MD  levothyroxine (SYNTHROID, LEVOTHROID) 150 MCG tablet Take 150 mcg by mouth daily before breakfast.    Historical Provider, MD  losartan-hydrochlorothiazide (HYZAAR) 100-25 MG per tablet Take 1 tablet by mouth daily.    Historical Provider, MD  methocarbamol (ROBAXIN) 500 MG tablet Take 1 tablet (500 mg total) by mouth every 8 (eight) hours as needed for muscle spasms (use mostly at night). 09/12/15   Katy Apo, NP  nitroGLYCERIN (NITROSTAT) 0.4 MG SL tablet Place 1 tablet (0.4 mg total) under the tongue every 5 (five) minutes as needed for chest pain. 02/25/15   Debbe Odea, MD  omeprazole (PRILOSEC) 20 MG capsule Take 20 mg by mouth daily before lunch.     Historical Provider, MD  predniSONE (STERAPRED UNI-PAK 21 TAB) 10 MG (21) TBPK tablet Take 1 tablet (10 mg total) by mouth daily. Take 6 tabs by mouth daily  for 2 days, then 5 tabs for 2 days, then 4 tabs for 2 days, then 3 tabs for 2 days, 2 tabs for 2 days, then 1 tab by mouth daily for 2 days 09/12/15   Katy Apo, NP   Meds Ordered and Administered this Visit  Medications - No data to display  BP 169/89 mmHg  Pulse 90  Temp(Src) 98.7 F (37.1 C) (Oral)  Resp 16  SpO2 100% No data found.   Physical Exam  Constitutional: She is oriented to person, place, and time. She appears well-developed and well-nourished. No distress.  Having difficulty changing positions and walks slowly across exam room.   Neck: Normal range of motion. Neck supple.  Cardiovascular: Normal rate, regular rhythm and normal heart sounds.   Pulmonary/Chest: Effort normal and breath sounds normal.  Musculoskeletal:       Lumbar back: She exhibits decreased range of motion, tenderness (along the erector spinae muscle group more right than left), pain and spasm. She exhibits no swelling and no deformity.       Back:  Lymphadenopathy:    She has no  cervical adenopathy.  Neurological: She is alert and oriented to person, place, and time. She has normal strength. No sensory deficit.  Skin: Skin is warm and dry.  Psychiatric: She has a normal mood and affect. Her behavior is normal. Judgment and thought content normal.    ED Course  Procedures (including critical care time)  Labs Review Labs Reviewed - No data to display  Imaging Review No results found.   Visual Acuity Review  Right Eye Distance:   Left Eye Distance:   Bilateral Distance:    Right Eye Near:   Left Eye Near:    Bilateral Near:         MDM   1.  Low back strain, initial encounter    Recommend Prednisone dose pack as directed. May use Robaxin muscle relaxer as needed, especially at night (patient may be allergic to a component in Flexeril). Apply warm, moist heat to area as needed for comfort. Limit bed rest. Continue mild activity to minimize stiffness. Recommend follow-up with her primary care provider if symptoms and pain do not improve.  Also- briefly reviewed that BP was elevated today. Encouraged to continue BP meds as prescribed. Discussed that Prednisone may also increase BP. Continue to monitor BP- if continues to be elevated- follow-up with her PCP for management.     Katy Apo, NP 09/13/15 2023

## 2015-12-09 ENCOUNTER — Ambulatory Visit (HOSPITAL_COMMUNITY)
Admission: EM | Admit: 2015-12-09 | Discharge: 2015-12-09 | Disposition: A | Discharge status: Home / Self Care | Payer: Commercial Managed Care - HMO | Attending: Family Medicine | Admitting: Family Medicine

## 2015-12-09 ENCOUNTER — Encounter (HOSPITAL_COMMUNITY): Payer: Self-pay | Admitting: Emergency Medicine

## 2015-12-09 DIAGNOSIS — Z23 Encounter for immunization: Secondary | ICD-10-CM

## 2015-12-09 DIAGNOSIS — W540XXA Bitten by dog, initial encounter: Secondary | ICD-10-CM | POA: Diagnosis not present

## 2015-12-09 DIAGNOSIS — T148XXA Other injury of unspecified body region, initial encounter: Secondary | ICD-10-CM | POA: Diagnosis not present

## 2015-12-09 MED ORDER — TETANUS-DIPHTH-ACELL PERTUSSIS 5-2.5-18.5 LF-MCG/0.5 IM SUSP
INTRAMUSCULAR | Status: AC
  Filled 2015-12-09: qty 0.5

## 2015-12-09 MED ORDER — DOXYCYCLINE HYCLATE 100 MG PO CAPS: 100.0000 mg | ORAL_CAPSULE | Freq: Two times a day (BID) | ORAL | 0 refills | Status: DC

## 2015-12-09 MED ORDER — TETANUS-DIPHTH-ACELL PERTUSSIS 5-2.5-18.5 LF-MCG/0.5 IM SUSP
0.5000 mL | Freq: Once | INTRAMUSCULAR | Status: AC
  Administered 2015-12-09: 0.5 mL via INTRAMUSCULAR

## 2015-12-09 NOTE — Discharge Instructions (Signed)
Start the doxycycline if your thumb becomes more swollen, more painful, regular with streaking.

## 2015-12-09 NOTE — ED Triage Notes (Signed)
Patient reports to [facility], she states that she was caring for her neighbors dog, and he bit her when she reached down. Patient states that the Dog is UTD on Rabies.

## 2015-12-09 NOTE — ED Provider Notes (Signed)
[place]    CSN: [id number] Arrival date & time: 12/09/15  1847     History   Chief Complaint Chief Complaint  Patient presents with  . Animal Bite    HPI Sheri Villarreal is a 55 y.o. female.   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 She's been watching her neighbors small dog who bit her right thumb this evening. The dog is having visual problems and is not totally familiar with the patient. The dog is up-to-date on her rabies shots however.  Patient cannot remove the last time she had a tetanus shot.      Past Medical History:  Diagnosis Date  . Arthritis    knees  . Carpal tunnel syndrome of right wrist 06/2013  . Dental crowns present   . Depression   . GERD (gastroesophageal reflux disease)   . History of hyperthyroidism   . Hypertension    under control with med., has been on med. x 9 yr.  . Tobacco abuse     Patient Active Problem List   Diagnosis Date Noted  . Chest pain 02/24/2015  . Depression 02/24/2015  . Hypothyroidism 02/24/2015  . GERD (gastroesophageal reflux disease)   . Hypertensive urgency   . Arthritis   . Tobacco abuse   . Pain in the chest   . Essential hypertension   . GOITER, MULTINODULAR 09/05/2007  . HYPERTHYROIDISM 09/05/2007    Past Surgical History:  Procedure Laterality Date  . APPENDECTOMY    . CARPAL TUNNEL RELEASE Right 07/04/2013   Procedure: RIGHT CARPAL TUNNEL RELEASE;  Surgeon: Wynonia Sours, MD;  Location: [facility];  Service: Orthopedics;  Laterality: Right;  . INGUINAL HERNIA REPAIR    . KNEE ARTHROSCOPY Right     OB History    No data available       Home Medications    Prior to Admission medications   Medication Sig Start Date End Date Taking? Authorizing Provider  amLODipine (NORVASC) 10 MG tablet Take 1 tablet (10 mg total) by mouth daily. 02/25/15  Yes Debbe Odea, MD  diclofenac (VOLTAREN) 75 MG EC tablet Take 75 mg by mouth at  bedtime.    Yes Historical Provider, MD  escitalopram (LEXAPRO) 10 MG tablet Take 10 mg by mouth daily.   Yes Historical Provider, MD  levothyroxine (SYNTHROID, LEVOTHROID) 150 MCG tablet Take 150 mcg by mouth daily before breakfast.   Yes Historical Provider, MD  losartan-hydrochlorothiazide (HYZAAR) 100-25 MG per tablet Take 1 tablet by mouth daily.   Yes Historical Provider, MD  acetaminophen (TYLENOL) 500 MG tablet Take 1,000 mg by mouth every 6 (six) hours as needed for mild pain.    Historical Provider, MD  aspirin EC 81 MG tablet Take 1 tablet (81 mg total) by mouth daily. 02/25/15   Debbe Odea, MD  atorvastatin (LIPITOR) 40 MG tablet Take 1 tablet (40 mg total) by mouth daily at 6 PM. 02/25/15   Debbe Odea, MD  buPROPion (WELLBUTRIN XL) 150 MG 24 hr tablet Take 150 mg by mouth daily.    Historical Provider, MD  doxycycline (VIBRAMYCIN) 100 MG capsule Take 1 capsule (100 mg total) by mouth 2 (two) times daily. 12/09/15   Robyn Haber, MD  fluticasone (FLONASE) 50 MCG/ACT nasal spray Place 2 sprays into both nostrils at bedtime. Patient taking differently: Place 2 sprays into both nostrils 2 (two) times daily as needed (head/ear congestion).  08/19/14   Shanon Brow  Nada Maclachlan, MD  methocarbamol (ROBAXIN) 500 MG tablet Take 1 tablet (500 mg total) by mouth every 8 (eight) hours as needed for muscle spasms (use mostly at night). 09/12/15   Katy Apo, NP  nitroGLYCERIN (NITROSTAT) 0.4 MG SL tablet Place 1 tablet (0.4 mg total) under the tongue every 5 (five) minutes as needed for chest pain. 02/25/15   Debbe Odea, MD  omeprazole (PRILOSEC) 20 MG capsule Take 20 mg by mouth daily before lunch.     Historical Provider, MD  predniSONE (STERAPRED UNI-PAK 21 TAB) 10 MG (21) TBPK tablet Take 1 tablet (10 mg total) by mouth daily. Take 6 tabs by mouth daily  for 2 days, then 5 tabs for 2 days, then 4 tabs for 2 days, then 3 tabs for 2 days, 2 tabs for 2 days, then 1 tab by mouth daily for 2 days 09/12/15    Katy Apo, NP    Family History Family History  Problem Relation Age of Onset  . Hypertension Mother   . Hypertension Father   . Kidney disease Father   . Diverticulosis Father   . Diabetes Sister   . Diabetes Brother     Social History Social History  Substance Use Topics  . Smoking status: Current Every Day Smoker    Years: 30.00    Types: Cigarettes  . Smokeless tobacco: Never Used     Comment: 7 cig./day  . Alcohol use Yes     Comment: infrequently     Allergies   Hydrocodone-homatropine   Review of Systems Review of Systems  Constitutional: Negative.   HENT: Negative.   Respiratory: Negative.   Cardiovascular: Negative.   Neurological: Negative.      Physical Exam Triage Vital Signs ED Triage Vitals  Enc Vitals Group     BP 12/09/15 2031 174/92     Pulse Rate 12/09/15 2031 83     Resp --      Temp 12/09/15 2031 98.7 F (37.1 C)     Temp Source 12/09/15 2031 Oral     SpO2 12/09/15 2031 99 %     Weight --      Height --      Head Circumference --      Peak Flow --      Pain Score 12/09/15 2057 2     Pain Loc --      Pain Edu? --      Excl. in [place]? --    No data found.   Updated Vital Signs BP 174/92 (BP Location: Left Arm)   Pulse 83   Temp 98.7 F (37.1 C) (Oral)   SpO2 99%    Physical Exam  Constitutional: She is oriented to person, place, and time. She appears well-developed and well-nourished.  HENT:  Head: Normocephalic and atraumatic.  Eyes: Conjunctivae and EOM are normal. Pupils are equal, round, and reactive to light.  Neck: Normal range of motion. Neck supple.  Musculoskeletal:  Right thumb has a superficial laceration just proximal to the nail and puncture wound just about 4 mm in length on the volar surface of her distal phalanx.  Thomas full range of motion, there is no active bleeding, there is no swelling, and there is no significant redness  Neurological: She is alert and oriented to person, place, and time.    Skin: Skin is warm and dry.  Nursing note and vitals reviewed.    UC Treatments / Results  Labs (all labs ordered are listed,  but only abnormal results are displayed) Labs Reviewed - No data to display  EKG  EKG Interpretation None       Radiology No results found.  Procedures Procedures (including critical care time)  Medications Ordered in UC Medications  Tdap (BOOSTRIX) injection 0.5 mL (not administered)     Initial Impression / Assessment and Plan / UC Course  I have reviewed the triage vital signs and the nursing notes.  Pertinent labs & imaging results that were available during my care of the patient were reviewed by me and considered in my medical decision making (see chart for details).  Clinical Course      Final Clinical Impressions(s) / UC Diagnoses   Final diagnoses:  Dog bite, initial encounter    New Prescriptions New Prescriptions   DOXYCYCLINE (VIBRAMYCIN) 100 MG CAPSULE    Take 1 capsule (100 mg total) by mouth 2 (two) times daily.     Robyn Haber, MD 12/09/15 2108

## 2015-12-10 ENCOUNTER — Encounter (HOSPITAL_COMMUNITY): Payer: Self-pay | Admitting: Emergency Medicine

## 2015-12-10 ENCOUNTER — Ambulatory Visit (HOSPITAL_COMMUNITY)
Admission: EM | Admit: 2015-12-10 | Discharge: 2015-12-10 | Disposition: A | Discharge status: Home / Self Care | Payer: Commercial Managed Care - HMO | Attending: Family Medicine | Admitting: Family Medicine

## 2015-12-10 DIAGNOSIS — L03011 Cellulitis of right finger: Secondary | ICD-10-CM | POA: Diagnosis not present

## 2015-12-10 MED ORDER — CEFTRIAXONE SODIUM 1 G IJ SOLR
1.0000 g | Freq: Once | INTRAMUSCULAR | Status: AC
  Administered 2015-12-10: 1 g via INTRAMUSCULAR

## 2015-12-10 MED ORDER — HYDROCODONE-ACETAMINOPHEN 5-325 MG PO TABS: 1.0000 | ORAL_TABLET | Freq: Four times a day (QID) | ORAL | 0 refills | Status: DC | PRN

## 2015-12-10 MED ORDER — LIDOCAINE HCL (PF) 1 % IJ SOLN
INTRAMUSCULAR | Status: AC
  Filled 2015-12-10: qty 2

## 2015-12-10 MED ORDER — CEFTRIAXONE SODIUM 1 G IJ SOLR
INTRAMUSCULAR | Status: AC
  Filled 2015-12-10: qty 10

## 2015-12-10 NOTE — ED Provider Notes (Signed)
[place]    CSN: [id number] Arrival date & time: 12/10/15  1453     History   Chief Complaint Chief Complaint  Patient presents with  . Follow-up    HPI Sheri Villarreal is a 55 y.o. female.   Is a 55 year old woman who is in last night after being bitten by a dog she was watching. She had a difficult night with pain in the right thumb and came in today for further evaluation. She's not yet started her antibiotic.      Past Medical History:  Diagnosis Date  . Arthritis    knees  . Carpal tunnel syndrome of right wrist 06/2013  . Dental crowns present   . Depression   . GERD (gastroesophageal reflux disease)   . History of hyperthyroidism   . Hypertension    under control with med., has been on med. x 9 yr.  . Tobacco abuse     Patient Active Problem List   Diagnosis Date Noted  . Chest pain 02/24/2015  . Depression 02/24/2015  . Hypothyroidism 02/24/2015  . GERD (gastroesophageal reflux disease)   . Hypertensive urgency   . Arthritis   . Tobacco abuse   . Pain in the chest   . Essential hypertension   . GOITER, MULTINODULAR 09/05/2007  . HYPERTHYROIDISM 09/05/2007    Past Surgical History:  Procedure Laterality Date  . APPENDECTOMY    . CARPAL TUNNEL RELEASE Right 07/04/2013   Procedure: RIGHT CARPAL TUNNEL RELEASE;  Surgeon: Wynonia Sours, MD;  Location: [facility];  Service: Orthopedics;  Laterality: Right;  . INGUINAL HERNIA REPAIR    . KNEE ARTHROSCOPY Right     OB History    No data available       Home Medications    Prior to Admission medications   Medication Sig Start Date End Date Taking? Authorizing Provider  amLODipine (NORVASC) 10 MG tablet Take 1 tablet (10 mg total) by mouth daily. 02/25/15  Yes Debbe Odea, MD  aspirin EC 81 MG tablet Take 1 tablet (81 mg total) by mouth daily. 02/25/15  Yes Debbe Odea, MD  atorvastatin (LIPITOR) 40 MG tablet Take 1 tablet (40 mg total) by mouth daily at 6 PM.  02/25/15  Yes Debbe Odea, MD  buPROPion (WELLBUTRIN XL) 150 MG 24 hr tablet Take 150 mg by mouth daily.   Yes Historical Provider, MD  diclofenac (VOLTAREN) 75 MG EC tablet Take 75 mg by mouth at bedtime.    Yes Historical Provider, MD  escitalopram (LEXAPRO) 10 MG tablet Take 10 mg by mouth daily.   Yes Historical Provider, MD  levothyroxine (SYNTHROID, LEVOTHROID) 150 MCG tablet Take 150 mcg by mouth daily before breakfast.   Yes Historical Provider, MD  losartan-hydrochlorothiazide (HYZAAR) 100-25 MG per tablet Take 1 tablet by mouth daily.   Yes Historical Provider, MD  methocarbamol (ROBAXIN) 500 MG tablet Take 1 tablet (500 mg total) by mouth every 8 (eight) hours as needed for muscle spasms (use mostly at night). 09/12/15  Yes Katy Apo, NP  omeprazole (PRILOSEC) 20 MG capsule Take 20 mg by mouth daily before lunch.    Yes Historical Provider, MD  acetaminophen (TYLENOL) 500 MG tablet Take 1,000 mg by mouth every 6 (six) hours as needed for mild pain.    Historical Provider, MD  doxycycline (VIBRAMYCIN) 100 MG capsule Take 1 capsule (100 mg total) by mouth 2 (two) times daily. 12/09/15   Robyn Haber, MD  fluticasone Asencion Islam)  50 MCG/ACT nasal spray Place 2 sprays into both nostrils at bedtime. Patient taking differently: Place 2 sprays into both nostrils 2 (two) times daily as needed (head/ear congestion).  08/19/14   Waldemar Dickens, MD  HYDROcodone-acetaminophen (NORCO) 5-325 MG tablet Take 1 tablet by mouth every 6 (six) hours as needed for moderate pain. 12/10/15   Robyn Haber, MD  nitroGLYCERIN (NITROSTAT) 0.4 MG SL tablet Place 1 tablet (0.4 mg total) under the tongue every 5 (five) minutes as needed for chest pain. 02/25/15   Debbe Odea, MD  predniSONE (STERAPRED UNI-PAK 21 TAB) 10 MG (21) TBPK tablet Take 1 tablet (10 mg total) by mouth daily. Take 6 tabs by mouth daily  for 2 days, then 5 tabs for 2 days, then 4 tabs for 2 days, then 3 tabs for 2 days, 2 tabs for 2 days,  then 1 tab by mouth daily for 2 days 09/12/15   Katy Apo, NP    Family History Family History  Problem Relation Age of Onset  . Hypertension Mother   . Hypertension Father   . Kidney disease Father   . Diverticulosis Father   . Diabetes Sister   . Diabetes Brother     Social History Social History  Substance Use Topics  . Smoking status: Current Every Day Smoker    Years: 30.00    Types: Cigarettes  . Smokeless tobacco: Never Used     Comment: 7 cig./day  . Alcohol use Yes     Comment: infrequently     Allergies   Hydrocodone-homatropine   Review of Systems Review of Systems   Physical Exam Triage Vital Signs ED Triage Vitals  Enc Vitals Group     BP 12/10/15 1517 171/95     Pulse Rate 12/10/15 1517 72     Resp 12/10/15 1517 16     Temp 12/10/15 1517 98.6 F (37 C)     Temp Source 12/10/15 1517 Oral     SpO2 12/10/15 1517 100 %     Weight --      Height --      Head Circumference --      Peak Flow --      Pain Score 12/10/15 1512 7     Pain Loc --      Pain Edu? --      Excl. in [place]? --    No data found.   Updated Vital Signs BP 171/95 (BP Location: Left Arm)   Pulse 72   Temp 98.6 F (37 C) (Oral)   Resp 16   SpO2 100%    Physical Exam  Constitutional: She appears well-developed and well-nourished.  Skin:  The right thumb shows moderate swelling in the distal phalanx and some pus accumulation in the laceration just proximal to the nail. Is very tender.  Nursing note and vitals reviewed.    UC Treatments / Results  Labs (all labs ordered are listed, but only abnormal results are displayed) Labs Reviewed - No data to display  EKG  EKG Interpretation None       Radiology No results found.  Procedures Procedures (including critical care time)  Medications Ordered in UC Medications  cefTRIAXone (ROCEPHIN) injection 1 g (not administered)     Initial Impression / Assessment and Plan / UC Course  I have reviewed the  triage vital signs and the nursing notes.  Pertinent labs & imaging results that were available during my care of the patient were reviewed by me  and considered in my medical decision making (see chart for details).  Clinical Course      Final Clinical Impressions(s) / UC Diagnoses   Final diagnoses:  Cellulitis of finger of right hand    New Prescriptions New Prescriptions   HYDROCODONE-ACETAMINOPHEN (NORCO) 5-325 MG TABLET    Take 1 tablet by mouth every 6 (six) hours as needed for moderate pain.     Robyn Haber, MD 12/10/15 [phone]

## 2015-12-10 NOTE — ED Triage Notes (Signed)
Pt is here for a f/u from dog bite to right thumb/hand  Reports pain has increased  A&O x4... NAD

## 2016-01-22 ENCOUNTER — Ambulatory Visit (HOSPITAL_COMMUNITY)
Admission: EM | Admit: 2016-01-22 | Discharge: 2016-01-22 | Disposition: A | Discharge status: Home / Self Care | Payer: Commercial Managed Care - HMO | Attending: Family Medicine | Admitting: Family Medicine

## 2016-01-22 ENCOUNTER — Encounter (HOSPITAL_COMMUNITY): Payer: Self-pay | Admitting: Emergency Medicine

## 2016-01-22 DIAGNOSIS — I10 Essential (primary) hypertension: Secondary | ICD-10-CM

## 2016-01-22 DIAGNOSIS — H6592 Unspecified nonsuppurative otitis media, left ear: Secondary | ICD-10-CM | POA: Diagnosis not present

## 2016-01-22 MED ORDER — AMOXICILLIN 500 MG PO TABS: 1000.0000 mg | ORAL_TABLET | Freq: Three times a day (TID) | ORAL | 0 refills | Status: AC

## 2016-01-22 NOTE — ED Provider Notes (Signed)
CSN: [id number]     Arrival date & time 01/22/16  1645 History   First MD Initiated Contact with Patient 01/22/16 1740     Chief Complaint  Patient presents with  . Otalgia   (Consider location/radiation/quality/duration/timing/severity/associated sxs/prior Treatment) Patient presents today believing that she has an ear infection. She reports left ear pain sudden onset this morning. Ear pain is 8/10 with some hearing loss and positive tinnitus. Patient denies ear discharge. She did not check temp at home but did '"feel feverish".   Patient's BP noted to be elevated today; she reports not taking her medication today and also reports that it could be high due to her pain. She denies blurry vision, headache, dizziness, chest pain or chest tightness.       Past Medical History:  Diagnosis Date  . Arthritis    knees  . Carpal tunnel syndrome of right wrist 06/2013  . Dental crowns present   . Depression   . GERD (gastroesophageal reflux disease)   . History of hyperthyroidism   . Hypertension    under control with med., has been on med. x 9 yr.  . Tobacco abuse    Past Surgical History:  Procedure Laterality Date  . APPENDECTOMY    . CARPAL TUNNEL RELEASE Right 07/04/2013   Procedure: RIGHT CARPAL TUNNEL RELEASE;  Surgeon: Wynonia Sours, MD;  Location: [facility];  Service: Orthopedics;  Laterality: Right;  . INGUINAL HERNIA REPAIR    . KNEE ARTHROSCOPY Right    Family History  Problem Relation Age of Onset  . Hypertension Mother   . Hypertension Father   . Kidney disease Father   . Diverticulosis Father   . Diabetes Sister   . Diabetes Brother    Social History  Substance Use Topics  . Smoking status: Current Every Day Smoker    Packs/day: 0.50    Years: 30.00    Types: Cigarettes  . Smokeless tobacco: Never Used     Comment: 7 cig./day  . Alcohol use Yes     Comment: infrequently   OB History    No data available     Review of Systems  All other  systems reviewed and are negative.   Allergies  Hydrocodone-homatropine  Home Medications   Prior to Admission medications   Medication Sig Start Date End Date Taking? Authorizing Provider  acetaminophen (TYLENOL) 500 MG tablet Take 1,000 mg by mouth every 6 (six) hours as needed for mild pain.    Historical Provider, MD  amLODipine (NORVASC) 10 MG tablet Take 1 tablet (10 mg total) by mouth daily. 02/25/15   Debbe Odea, MD  amoxicillin (AMOXIL) 500 MG tablet Take 2 tablets (1,000 mg total) by mouth every 8 (eight) hours. 01/22/16 01/29/16  Barry Dienes, NP  aspirin EC 81 MG tablet Take 1 tablet (81 mg total) by mouth daily. 02/25/15   Debbe Odea, MD  atorvastatin (LIPITOR) 40 MG tablet Take 1 tablet (40 mg total) by mouth daily at 6 PM. 02/25/15   Debbe Odea, MD  buPROPion (WELLBUTRIN XL) 150 MG 24 hr tablet Take 150 mg by mouth daily.    Historical Provider, MD  diclofenac (VOLTAREN) 75 MG EC tablet Take 75 mg by mouth at bedtime.     Historical Provider, MD  doxycycline (VIBRAMYCIN) 100 MG capsule Take 1 capsule (100 mg total) by mouth 2 (two) times daily. 12/09/15   Robyn Haber, MD  escitalopram (LEXAPRO) 10 MG tablet Take 10 mg by mouth daily.  Historical Provider, MD  fluticasone (FLONASE) 50 MCG/ACT nasal spray Place 2 sprays into both nostrils at bedtime. Patient taking differently: Place 2 sprays into both nostrils 2 (two) times daily as needed (head/ear congestion).  08/19/14   Waldemar Dickens, MD  HYDROcodone-acetaminophen (NORCO) 5-325 MG tablet Take 1 tablet by mouth every 6 (six) hours as needed for moderate pain. 12/10/15   Robyn Haber, MD  levothyroxine (SYNTHROID, LEVOTHROID) 150 MCG tablet Take 150 mcg by mouth daily before breakfast.    Historical Provider, MD  losartan-hydrochlorothiazide (HYZAAR) 100-25 MG per tablet Take 1 tablet by mouth daily.    Historical Provider, MD  methocarbamol (ROBAXIN) 500 MG tablet Take 1 tablet (500 mg total) by mouth every 8 (eight)  hours as needed for muscle spasms (use mostly at night). 09/12/15   Katy Apo, NP  nitroGLYCERIN (NITROSTAT) 0.4 MG SL tablet Place 1 tablet (0.4 mg total) under the tongue every 5 (five) minutes as needed for chest pain. 02/25/15   Debbe Odea, MD  omeprazole (PRILOSEC) 20 MG capsule Take 20 mg by mouth daily before lunch.     Historical Provider, MD  predniSONE (STERAPRED UNI-PAK 21 TAB) 10 MG (21) TBPK tablet Take 1 tablet (10 mg total) by mouth daily. Take 6 tabs by mouth daily  for 2 days, then 5 tabs for 2 days, then 4 tabs for 2 days, then 3 tabs for 2 days, 2 tabs for 2 days, then 1 tab by mouth daily for 2 days 09/12/15   Katy Apo, NP   Meds Ordered and Administered this Visit  Medications - No data to display  BP 192/84 (BP Location: Left Arm)   Pulse 75   Temp 98.7 F (37.1 C) (Oral)   Resp 16   Ht 5\' 4"  (1.626 m)   Wt 220 lb (99.8 kg)   SpO2 99%   BMI 37.76 kg/m  No data found.   Physical Exam  Constitutional: She is oriented to person, place, and time. She appears well-developed and well-nourished.  HENT:  Head: Normocephalic and atraumatic.  Right Ear: External ear normal.  Left Ear: External ear normal.  Nose: Nose normal.  Mouth/Throat: Oropharynx is clear and moist. No oropharyngeal exudate.  Right TM normal Left TM is red and bulging with no discharge.   Eyes: Conjunctivae and EOM are normal. Pupils are equal, round, and reactive to light.  Neck: Normal range of motion. Neck supple.  Cardiovascular: Normal rate, regular rhythm and normal heart sounds.   Pulmonary/Chest: Effort normal and breath sounds normal. No respiratory distress. She has no wheezes.  Abdominal: Soft. Bowel sounds are normal. She exhibits no distension. There is no tenderness.  Lymphadenopathy:    She has no cervical adenopathy.  Neurological: She is alert and oriented to person, place, and time.  Skin: Skin is warm and dry.  Nursing note and vitals reviewed.   Urgent Care  Course   Clinical Course     Procedures (including critical care time)  Labs Review Labs Reviewed - No data to display  Imaging Review No results found.  MDM   1. Left non-suppurative otitis media   2. Essential hypertension    1) Amoxicillin 1000 mg TID for 7 days. May take the hydrocodone you have at home for pain if you find that helpful. Follow up with [place] or your PCP if symptoms does not improve.   2) HTN: Patient did not take BP medications this morning. She also reports in a lot  of pain. Besides the ear pain, she is otherwise asymptomatic. Instructed to take her BP med when she gets home. Check her BP before going to bed tonight and check her BP again tomorrow. If BP remains this elevated tomorrow; then please call PCP to schedule an f/u.     Barry Dienes, NP 01/22/16 [phone]

## 2016-01-22 NOTE — ED Triage Notes (Signed)
PT reports left side ear pain that started this afternoon.

## 2016-02-05 ENCOUNTER — Ambulatory Visit (HOSPITAL_COMMUNITY)
Admission: EM | Admit: 2016-02-05 | Discharge: 2016-02-05 | Disposition: A | Discharge status: Home / Self Care | Payer: Commercial Managed Care - HMO | Attending: Family Medicine | Admitting: Family Medicine

## 2016-02-05 ENCOUNTER — Encounter (HOSPITAL_COMMUNITY): Payer: Self-pay | Admitting: Emergency Medicine

## 2016-02-05 DIAGNOSIS — S61213A Laceration without foreign body of left middle finger without damage to nail, initial encounter: Secondary | ICD-10-CM

## 2016-02-05 NOTE — ED Notes (Signed)
Applied band aid to finger.

## 2016-02-05 NOTE — ED Triage Notes (Signed)
Laceration to left middle finger, cut with scissors

## 2016-02-05 NOTE — ED Provider Notes (Signed)
[place]    CSN: [id number] Arrival date & time: 02/05/16  1056     History   Chief Complaint Chief Complaint  Patient presents with  . Laceration    HPI Sheri Villarreal is a 55 y.o. female.   This a 55 year old woman who presents with a laceration on her left middle finger. She cut her lateral side of the cuticle with a parous scissors.  She's up-to-date on her tetanus shots.      Past Medical History:  Diagnosis Date  . Arthritis    knees  . Carpal tunnel syndrome of right wrist 06/2013  . Dental crowns present   . Depression   . GERD (gastroesophageal reflux disease)   . History of hyperthyroidism   . Hypertension    under control with med., has been on med. x 9 yr.  . Tobacco abuse     Patient Active Problem List   Diagnosis Date Noted  . Chest pain 02/24/2015  . Depression 02/24/2015  . Hypothyroidism 02/24/2015  . GERD (gastroesophageal reflux disease)   . Hypertensive urgency   . Arthritis   . Tobacco abuse   . Pain in the chest   . Essential hypertension   . GOITER, MULTINODULAR 09/05/2007  . HYPERTHYROIDISM 09/05/2007    Past Surgical History:  Procedure Laterality Date  . APPENDECTOMY    . CARPAL TUNNEL RELEASE Right 07/04/2013   Procedure: RIGHT CARPAL TUNNEL RELEASE;  Surgeon: Wynonia Sours, MD;  Location: [place];  Service: Orthopedics;  Laterality: Right;  . INGUINAL HERNIA REPAIR    . KNEE ARTHROSCOPY Right     OB History    No data available       Home Medications    Prior to Admission medications   Medication Sig Start Date End Date Taking? Authorizing Provider  acetaminophen (TYLENOL) 500 MG tablet Take 1,000 mg by mouth every 6 (six) hours as needed for mild pain.    Historical Provider, MD  amLODipine (NORVASC) 10 MG tablet Take 1 tablet (10 mg total) by mouth daily. 02/25/15   Debbe Odea, MD  aspirin EC 81 MG tablet Take 1 tablet (81 mg total) by mouth daily. 02/25/15   Debbe Odea, MD    atorvastatin (LIPITOR) 40 MG tablet Take 1 tablet (40 mg total) by mouth daily at 6 PM. 02/25/15   Debbe Odea, MD  buPROPion (WELLBUTRIN XL) 150 MG 24 hr tablet Take 150 mg by mouth daily.    Historical Provider, MD  diclofenac (VOLTAREN) 75 MG EC tablet Take 75 mg by mouth at bedtime.     Historical Provider, MD  doxycycline (VIBRAMYCIN) 100 MG capsule Take 1 capsule (100 mg total) by mouth 2 (two) times daily. 12/09/15   Robyn Haber, MD  escitalopram (LEXAPRO) 10 MG tablet Take 10 mg by mouth daily.    Historical Provider, MD  fluticasone (FLONASE) 50 MCG/ACT nasal spray Place 2 sprays into both nostrils at bedtime. Patient taking differently: Place 2 sprays into both nostrils 2 (two) times daily as needed (head/ear congestion).  08/19/14   Waldemar Dickens, MD  HYDROcodone-acetaminophen (NORCO) 5-325 MG tablet Take 1 tablet by mouth every 6 (six) hours as needed for moderate pain. 12/10/15   Robyn Haber, MD  levothyroxine (SYNTHROID, LEVOTHROID) 150 MCG tablet Take 150 mcg by mouth daily before breakfast.    Historical Provider, MD  losartan-hydrochlorothiazide (HYZAAR) 100-25 MG per tablet Take 1 tablet by mouth daily.    Historical Provider, MD  methocarbamol (ROBAXIN) 500 MG tablet Take 1 tablet (500 mg total) by mouth every 8 (eight) hours as needed for muscle spasms (use mostly at night). 09/12/15   Katy Apo, NP  nitroGLYCERIN (NITROSTAT) 0.4 MG SL tablet Place 1 tablet (0.4 mg total) under the tongue every 5 (five) minutes as needed for chest pain. 02/25/15   Debbe Odea, MD  omeprazole (PRILOSEC) 20 MG capsule Take 20 mg by mouth daily before lunch.     Historical Provider, MD  predniSONE (STERAPRED UNI-PAK 21 TAB) 10 MG (21) TBPK tablet Take 1 tablet (10 mg total) by mouth daily. Take 6 tabs by mouth daily  for 2 days, then 5 tabs for 2 days, then 4 tabs for 2 days, then 3 tabs for 2 days, 2 tabs for 2 days, then 1 tab by mouth daily for 2 days 09/12/15   Katy Apo, NP     Family History Family History  Problem Relation Age of Onset  . Hypertension Mother   . Hypertension Father   . Kidney disease Father   . Diverticulosis Father   . Diabetes Sister   . Diabetes Brother     Social History Social History  Substance Use Topics  . Smoking status: Current Every Day Smoker    Packs/day: 0.50    Years: 30.00    Types: Cigarettes  . Smokeless tobacco: Never Used     Comment: 7 cig./day  . Alcohol use Yes     Comment: infrequently     Allergies   Hydrocodone-homatropine   Review of Systems Review of Systems  Constitutional: Negative.   Musculoskeletal: Negative.   Skin: Positive for wound.     Physical Exam Triage Vital Signs ED Triage Vitals  Enc Vitals Group     BP 02/05/16 1109 130/77     Pulse Rate 02/05/16 1109 89     Resp 02/05/16 1109 18     Temp 02/05/16 1109 98.3 F (36.8 C)     Temp Source 02/05/16 1109 Oral     SpO2 02/05/16 1109 96 %     Weight --      Height --      Head Circumference --      Peak Flow --      Pain Score 02/05/16 1108 1     Pain Loc --      Pain Edu? --      Excl. in [place]? --    No data found.   Updated Vital Signs BP 130/77   Pulse 89   Temp 98.3 F (36.8 C) (Oral)   Resp 18   SpO2 96%    Physical Exam  Constitutional: She is oriented to person, place, and time. She appears well-developed and well-nourished.  HENT:  Right Ear: External ear normal.  Left Ear: External ear normal.  Mouth/Throat: Oropharynx is clear and moist.  Eyes: Conjunctivae are normal.  Neck: Normal range of motion. Neck supple.  Pulmonary/Chest: Effort normal.  Musculoskeletal: Normal range of motion. She exhibits tenderness. She exhibits no deformity.  Half centimeter laceration on the ulnar side of the left middle finger cuticle.  Neurological: She is alert and oriented to person, place, and time.  Skin: Skin is warm and dry.  Nursing note and vitals reviewed.    UC Treatments / Results  Labs (all  labs ordered are listed, but only abnormal results are displayed) Labs Reviewed - No data to display  EKG  EKG Interpretation None  Radiology No results found.  Procedures .Marland KitchenLaceration Repair Date/Time: 02/05/2016 11:30 AM Performed by: Robyn Haber Authorized by: Robyn Haber   Consent:    Consent obtained:  Verbal   Consent given by:  Patient   Risks discussed:  Pain   Alternatives discussed:  No treatment Anesthesia (see MAR for exact dosages):    Anesthesia method:  None Laceration details:    Location:  Finger   Finger location:  L long finger Repair type:    Repair type:  Simple Exploration:    Hemostasis achieved with:  Direct pressure   Contaminated: no   Treatment:    Area cleansed with:  Soap and water   Amount of cleaning:  Standard   Irrigation solution:  Tap water   Visualized foreign bodies/material removed: no   Skin repair:    Repair method:  Tissue adhesive Approximation:    Vermilion border: well-aligned   Post-procedure details:    Dressing:  Open (no dressing)   Patient tolerance of procedure:  Tolerated well, no immediate complications   (including critical care time)  Medications Ordered in UC Medications - No data to display   Initial Impression / Assessment and Plan / UC Course  I have reviewed the triage vital signs and the nursing notes.  Pertinent labs & imaging results that were available during my care of the patient were reviewed by me and considered in my medical decision making (see chart for details).  Clinical Course       Final Clinical Impressions(s) / UC Diagnoses   Final diagnoses:  Laceration of left middle finger without foreign body without damage to nail, initial encounter    New Prescriptions New Prescriptions   No medications on file     Robyn Haber, MD 02/05/16 1131

## 2016-02-05 NOTE — Discharge Instructions (Signed)
The glue used the last about 3-4 days. During that time skin will form its own adhesive closure.  Avoid using petroleum jelly containing products or nail polish remover as this will remove the glue that we used today

## 2016-02-14 ENCOUNTER — Ambulatory Visit (HOSPITAL_COMMUNITY)
Admission: EM | Admit: 2016-02-14 | Discharge: 2016-02-14 | Disposition: A | Discharge status: Home / Self Care | Payer: Commercial Managed Care - HMO | Attending: Family Medicine | Admitting: Family Medicine

## 2016-02-14 ENCOUNTER — Encounter (HOSPITAL_COMMUNITY): Payer: Self-pay | Admitting: Emergency Medicine

## 2016-02-14 DIAGNOSIS — Z720 Tobacco use: Secondary | ICD-10-CM

## 2016-02-14 DIAGNOSIS — J3489 Other specified disorders of nose and nasal sinuses: Secondary | ICD-10-CM

## 2016-02-14 DIAGNOSIS — J9801 Acute bronchospasm: Secondary | ICD-10-CM

## 2016-02-14 DIAGNOSIS — J069 Acute upper respiratory infection, unspecified: Secondary | ICD-10-CM

## 2016-02-14 DIAGNOSIS — B9789 Other viral agents as the cause of diseases classified elsewhere: Secondary | ICD-10-CM

## 2016-02-14 MED ORDER — ALBUTEROL SULFATE HFA 108 (90 BASE) MCG/ACT IN AERS: 2.0000 | INHALATION_SPRAY | RESPIRATORY_TRACT | 0 refills | Status: DC | PRN

## 2016-02-14 MED ORDER — PREDNISONE 50 MG PO TABS: ORAL_TABLET | ORAL | 0 refills | Status: DC

## 2016-02-14 NOTE — Discharge Instructions (Signed)
Recommend taking Zyrtec or Allegra for drainage. For your bronchospasm or wheezing take the prednisone as directed and use the albuterol inhaler 2 puffs every 4 hours for coughing spasms which are likely due to wheezing. Tylenol every 4 hours for fever and discomfort. If your shortness of breath is worse, or  have increasing fevers, worsening cough, trouble breathing or chest pain go directly to the emergency department or call 911.

## 2016-02-14 NOTE — ED Triage Notes (Signed)
PT reports cough that started this morning. PT reports cough only brings up clear saliva. PT reports sore throat from cough.

## 2016-02-14 NOTE — ED Provider Notes (Signed)
CSN: [id number]     Arrival date & time 02/14/16  1950 History   First MD Initiated Contact with Patient 02/14/16 2009     Chief Complaint  Patient presents with  . Cough   (Consider location/radiation/quality/duration/timing/severity/associated sxs/prior Treatment) 55 year old female presents to the urgent care today with questionable fever and cough. She has some dyspnea on exertion. She smokes a half a pack per day. Shows a history of hypertension. Her primary complaint is that of cough.      Past Medical History:  Diagnosis Date  . Arthritis    knees  . Carpal tunnel syndrome of right wrist 06/2013  . Dental crowns present   . Depression   . GERD (gastroesophageal reflux disease)   . History of hyperthyroidism   . Hypertension    under control with med., has been on med. x 9 yr.  . Tobacco abuse    Past Surgical History:  Procedure Laterality Date  . APPENDECTOMY    . CARPAL TUNNEL RELEASE Right 07/04/2013   Procedure: RIGHT CARPAL TUNNEL RELEASE;  Surgeon: Wynonia Sours, MD;  Location: [place];  Service: Orthopedics;  Laterality: Right;  . INGUINAL HERNIA REPAIR    . KNEE ARTHROSCOPY Right    Family History  Problem Relation Age of Onset  . Hypertension Mother   . Hypertension Father   . Kidney disease Father   . Diverticulosis Father   . Diabetes Sister   . Diabetes Brother    Social History  Substance Use Topics  . Smoking status: Current Every Day Smoker    Packs/day: 0.50    Years: 30.00    Types: Cigarettes  . Smokeless tobacco: Never Used     Comment: 7 cig./day  . Alcohol use No     Comment: infrequently   OB History    No data available     Review of Systems  Constitutional: Positive for fever. Negative for activity change.  HENT: Positive for rhinorrhea. Negative for ear pain, sore throat and trouble swallowing.   Respiratory: Positive for cough. Negative for stridor.   Cardiovascular: Negative for chest pain, palpitations  and leg swelling.  Gastrointestinal: Negative.   Genitourinary: Negative.   Skin: Negative.   Neurological: Negative.   Psychiatric/Behavioral: Negative.     Allergies  Hydrocodone-homatropine  Home Medications   Prior to Admission medications   Medication Sig Start Date End Date Taking? Authorizing Provider  acetaminophen (TYLENOL) 500 MG tablet Take 1,000 mg by mouth every 6 (six) hours as needed for mild pain.    Historical Provider, MD  albuterol (PROVENTIL HFA;VENTOLIN HFA) 108 (90 Base) MCG/ACT inhaler Inhale 2 puffs into the lungs every 4 (four) hours as needed for wheezing or shortness of breath. 02/14/16   Janne Napoleon, NP  amLODipine (NORVASC) 10 MG tablet Take 1 tablet (10 mg total) by mouth daily. 02/25/15   Debbe Odea, MD  aspirin EC 81 MG tablet Take 1 tablet (81 mg total) by mouth daily. 02/25/15   Debbe Odea, MD  atorvastatin (LIPITOR) 40 MG tablet Take 1 tablet (40 mg total) by mouth daily at 6 PM. 02/25/15   Debbe Odea, MD  buPROPion (WELLBUTRIN XL) 150 MG 24 hr tablet Take 150 mg by mouth daily.    Historical Provider, MD  diclofenac (VOLTAREN) 75 MG EC tablet Take 75 mg by mouth at bedtime.     Historical Provider, MD  doxycycline (VIBRAMYCIN) 100 MG capsule Take 1 capsule (100 mg total) by mouth 2 (two)  times daily. 12/09/15   Robyn Haber, MD  escitalopram (LEXAPRO) 10 MG tablet Take 10 mg by mouth daily.    Historical Provider, MD  fluticasone (FLONASE) 50 MCG/ACT nasal spray Place 2 sprays into both nostrils at bedtime. Patient taking differently: Place 2 sprays into both nostrils 2 (two) times daily as needed (head/ear congestion).  08/19/14   Waldemar Dickens, MD  HYDROcodone-acetaminophen (NORCO) 5-325 MG tablet Take 1 tablet by mouth every 6 (six) hours as needed for moderate pain. 12/10/15   Robyn Haber, MD  levothyroxine (SYNTHROID, LEVOTHROID) 150 MCG tablet Take 150 mcg by mouth daily before breakfast.    Historical Provider, MD   losartan-hydrochlorothiazide (HYZAAR) 100-25 MG per tablet Take 1 tablet by mouth daily.    Historical Provider, MD  methocarbamol (ROBAXIN) 500 MG tablet Take 1 tablet (500 mg total) by mouth every 8 (eight) hours as needed for muscle spasms (use mostly at night). 09/12/15   Katy Apo, NP  nitroGLYCERIN (NITROSTAT) 0.4 MG SL tablet Place 1 tablet (0.4 mg total) under the tongue every 5 (five) minutes as needed for chest pain. 02/25/15   Debbe Odea, MD  omeprazole (PRILOSEC) 20 MG capsule Take 20 mg by mouth daily before lunch.     Historical Provider, MD  predniSONE (DELTASONE) 50 MG tablet 1 tab po daily for 6 days. Take with food. 02/14/16   Janne Napoleon, NP   Meds Ordered and Administered this Visit  Medications - No data to display  BP 169/85   Pulse 117   Temp 100.6 F (38.1 C) (Oral)   Resp 22   Ht 5\' 3"  (1.6 m)   Wt 238 lb (108 kg)   SpO2 99%   BMI 42.16 kg/m  No data found.   Physical Exam  Constitutional: She is oriented to person, place, and time. No distress.  HENT:  Right Ear: External ear normal.  Left Ear: External ear normal.  Unable to visualize the oropharynx as patient does not allow tongue depression and maintains tongue tight against the roof of her mouth.  Eyes: EOM are normal.  Neck: Normal range of motion. Neck supple.  Cardiovascular: Normal rate, regular rhythm and normal heart sounds.   Pulmonary/Chest: Effort normal. No respiratory distress.  Breath sounds although distant bilaterally. No wheezing with tidal volume. With forced cough there is mild coarseness. During the lung exam and taking a deep breath this produces coughing spasms.  Musculoskeletal: Normal range of motion. She exhibits no edema.  Lymphadenopathy:    She has no cervical adenopathy.  Neurological: She is alert and oriented to person, place, and time. No cranial nerve deficit.  Skin: Skin is warm and dry. No rash noted.  Psychiatric: She has a normal mood and affect.  Nursing  note and vitals reviewed.   Urgent Care Course   Clinical Course     Procedures (including critical care time)  Labs Review Labs Reviewed - No data to display  Imaging Review No results found.   Visual Acuity Review  Right Eye Distance:   Left Eye Distance:   Bilateral Distance:    Right Eye Near:   Left Eye Near:    Bilateral Near:         MDM   1. Cough due to bronchospasm   2. Rhinorrhea   3. Tobacco abuse disorder   4. Viral URI with cough    Patient with low-grade fever and cough and suspected bronchospasm. She does not have a history of diagnosed  COPD or asthma. No shortness of breath at rest. She does have tachypnea, on exam apical rate 96. Looking at past visits with similar complaints vital signs are similar. Patient has no chest pain, heaviness or tightness. Recommend taking Zyrtec or Allegra for drainage. For your bronchospasm or wheezing take the prednisone as directed and use the albuterol inhaler 2 puffs every 4 hours for coughing spasms which are likely due to wheezing. Tylenol every 4 hours for fever and discomfort. If your shortness of breath is worse, or  have increasing fevers, worsening cough, trouble breathing or chest pain go directly to the emergency department or call 911. Meds ordered this encounter  Medications  . albuterol (PROVENTIL HFA;VENTOLIN HFA) 108 (90 Base) MCG/ACT inhaler    Sig: Inhale 2 puffs into the lungs every 4 (four) hours as needed for wheezing or shortness of breath.    Dispense:  1 Inhaler    Refill:  0    Order Specific Question:   Supervising Provider    Answer:   Ihor Gully D [id number]  . predniSONE (DELTASONE) 50 MG tablet    Sig: 1 tab po daily for 6 days. Take with food.    Dispense:  6 tablet    Refill:  0    Order Specific Question:   Supervising Provider    Answer:   Billy Fischer [[id number]]       Janne Napoleon, NP 02/14/16 2041    Janne Napoleon, NP 02/14/16 2046

## 2016-02-17 ENCOUNTER — Emergency Department (HOSPITAL_COMMUNITY): Payer: Commercial Managed Care - HMO

## 2016-02-17 ENCOUNTER — Observation Stay (HOSPITAL_COMMUNITY)
Admission: EM | Admit: 2016-02-17 | Discharge: 2016-02-19 | Disposition: A | Discharge status: Home / Self Care | Payer: Commercial Managed Care - HMO | Attending: Family Medicine | Admitting: Family Medicine

## 2016-02-17 ENCOUNTER — Encounter (HOSPITAL_COMMUNITY): Payer: Self-pay

## 2016-02-17 DIAGNOSIS — Z792 Long term (current) use of antibiotics: Secondary | ICD-10-CM | POA: Insufficient documentation

## 2016-02-17 DIAGNOSIS — Z7982 Long term (current) use of aspirin: Secondary | ICD-10-CM | POA: Diagnosis not present

## 2016-02-17 DIAGNOSIS — E039 Hypothyroidism, unspecified: Secondary | ICD-10-CM | POA: Diagnosis not present

## 2016-02-17 DIAGNOSIS — J209 Acute bronchitis, unspecified: Secondary | ICD-10-CM | POA: Diagnosis not present

## 2016-02-17 DIAGNOSIS — E876 Hypokalemia: Secondary | ICD-10-CM | POA: Diagnosis not present

## 2016-02-17 DIAGNOSIS — J111 Influenza due to unidentified influenza virus with other respiratory manifestations: Secondary | ICD-10-CM | POA: Diagnosis not present

## 2016-02-17 DIAGNOSIS — R69 Illness, unspecified: Secondary | ICD-10-CM

## 2016-02-17 DIAGNOSIS — R531 Weakness: Secondary | ICD-10-CM

## 2016-02-17 DIAGNOSIS — I10 Essential (primary) hypertension: Secondary | ICD-10-CM | POA: Diagnosis not present

## 2016-02-17 DIAGNOSIS — K219 Gastro-esophageal reflux disease without esophagitis: Secondary | ICD-10-CM | POA: Diagnosis not present

## 2016-02-17 DIAGNOSIS — F329 Major depressive disorder, single episode, unspecified: Secondary | ICD-10-CM | POA: Diagnosis not present

## 2016-02-17 DIAGNOSIS — Z79899 Other long term (current) drug therapy: Secondary | ICD-10-CM | POA: Insufficient documentation

## 2016-02-17 DIAGNOSIS — F17210 Nicotine dependence, cigarettes, uncomplicated: Secondary | ICD-10-CM | POA: Insufficient documentation

## 2016-02-17 DIAGNOSIS — R05 Cough: Secondary | ICD-10-CM | POA: Diagnosis present

## 2016-02-17 LAB — COMPREHENSIVE METABOLIC PANEL
ALBUMIN: 3.4 g/dL — AB (ref 3.5–5.0)
ALT: 19 U/L (ref 14–54)
ANION GAP: 14 (ref 5–15)
AST: 29 U/L (ref 15–41)
Alkaline Phosphatase: 69 U/L (ref 38–126)
BUN: 16 mg/dL (ref 6–20)
CO2: 23 mmol/L (ref 22–32)
Calcium: 8.6 mg/dL — ABNORMAL LOW (ref 8.9–10.3)
Chloride: 95 mmol/L — ABNORMAL LOW (ref 101–111)
Creatinine, Ser: 0.8 mg/dL (ref 0.44–1.00)
GFR calc non Af Amer: >60 mL/min (ref >60)
GLUCOSE: 97 mg/dL (ref 65–99)
POTASSIUM: 3 mmol/L — AB (ref 3.5–5.1)
SODIUM: 132 mmol/L — AB (ref 135–145)
TOTAL PROTEIN: 7 g/dL (ref 6.5–8.1)
Total Bilirubin: 0.8 mg/dL (ref 0.3–1.2)

## 2016-02-17 LAB — CBC WITH DIFFERENTIAL/PLATELET
BASOS ABS: 0 10*3/uL (ref 0.0–0.1)
Basophils Relative: 0 %
Eosinophils Absolute: 0 10*3/uL (ref 0.0–0.7)
Eosinophils Relative: 0 %
HEMATOCRIT: 44.6 % (ref 36.0–46.0)
HEMOGLOBIN: 15.4 g/dL — AB (ref 12.0–15.0)
LYMPHS PCT: 16 %
Lymphs Abs: 1.4 10*3/uL (ref 0.7–4.0)
MCH: 30.7 pg (ref 26.0–34.0)
MCHC: 34.5 g/dL (ref 30.0–36.0)
MCV: 89 fL (ref 78.0–100.0)
MONO ABS: 0.6 10*3/uL (ref 0.1–1.0)
Monocytes Relative: 7 %
NEUTROS ABS: 6.5 10*3/uL (ref 1.7–7.7)
NEUTROS PCT: 77 %
Platelets: 273 10*3/uL (ref 150–400)
RBC: 5.01 MIL/uL (ref 3.87–5.11)
RDW: 13.5 % (ref 11.5–15.5)
WBC: 8.4 10*3/uL (ref 4.0–10.5)

## 2016-02-17 LAB — I-STAT CG4 LACTIC ACID, ED: LACTIC ACID, VENOUS: 0.97 mmol/L (ref 0.5–1.9)

## 2016-02-17 LAB — I-STAT TROPONIN, ED: Troponin i, poc: 0.01 ng/mL (ref 0.00–0.08)

## 2016-02-17 MED ORDER — ONDANSETRON HCL 4 MG/2ML IJ SOLN
4.0000 mg | Freq: Once | INTRAMUSCULAR | Status: AC
  Administered 2016-02-17: 4 mg via INTRAVENOUS
  Filled 2016-02-17: qty 2

## 2016-02-17 MED ORDER — ALBUTEROL SULFATE (2.5 MG/3ML) 0.083% IN NEBU
INHALATION_SOLUTION | RESPIRATORY_TRACT | Status: AC
  Filled 2016-02-17: qty 6

## 2016-02-17 MED ORDER — ONDANSETRON 4 MG PO TBDP
ORAL_TABLET | ORAL | Status: AC
  Filled 2016-02-17: qty 1

## 2016-02-17 MED ORDER — SODIUM CHLORIDE 0.9 % IV BOLUS (SEPSIS)
1000.0000 mL | Freq: Once | INTRAVENOUS | Status: AC
  Administered 2016-02-17: 1000 mL via INTRAVENOUS

## 2016-02-17 MED ORDER — KETOROLAC TROMETHAMINE 30 MG/ML IJ SOLN
30.0000 mg | Freq: Once | INTRAMUSCULAR | Status: AC
  Administered 2016-02-17: 30 mg via INTRAVENOUS
  Filled 2016-02-17: qty 1

## 2016-02-17 MED ORDER — ONDANSETRON 4 MG PO TBDP
4.0000 mg | ORAL_TABLET | Freq: Once | ORAL | Status: AC
  Administered 2016-02-17: 4 mg via ORAL

## 2016-02-17 MED ORDER — ALBUTEROL SULFATE (2.5 MG/3ML) 0.083% IN NEBU
5.0000 mg | INHALATION_SOLUTION | Freq: Once | RESPIRATORY_TRACT | Status: AC
  Administered 2016-02-17: 5 mg via RESPIRATORY_TRACT

## 2016-02-17 NOTE — ED Provider Notes (Signed)
[facility] DEPT Provider Note   CSN: [id number] Arrival date & time: 02/17/16  [place]     History   Chief Complaint Chief Complaint  Patient presents with  . Cough  . Shortness of Breath  . Generalized Body Aches    HPI Sheri Villarreal is a 55 y.o. female.  HPI   Patient is a very pleasant 55 year old female presenting today with fever cough and hypoxia. Patient seen on Friday for cough. Patient given albuterol and prednisone. She says that her symptoms seem to be getting worse. She's had increasing cough with sputum, increasing fatigue. Inability to take by mouth due to nausea. Body aches, fever.  Past Medical History:  Diagnosis Date  . Arthritis    knees  . Carpal tunnel syndrome of right wrist 06/2013  . Dental crowns present   . Depression   . GERD (gastroesophageal reflux disease)   . History of hyperthyroidism   . Hypertension    under control with med., has been on med. x 9 yr.  . Tobacco abuse     Patient Active Problem List   Diagnosis Date Noted  . Chest pain 02/24/2015  . Depression 02/24/2015  . Hypothyroidism 02/24/2015  . GERD (gastroesophageal reflux disease)   . Hypertensive urgency   . Arthritis   . Tobacco abuse   . Pain in the chest   . Essential hypertension   . GOITER, MULTINODULAR 09/05/2007  . HYPERTHYROIDISM 09/05/2007    Past Surgical History:  Procedure Laterality Date  . APPENDECTOMY    . CARPAL TUNNEL RELEASE Right 07/04/2013   Procedure: RIGHT CARPAL TUNNEL RELEASE;  Surgeon: Wynonia Sours, MD;  Location: [facility];  Service: Orthopedics;  Laterality: Right;  . INGUINAL HERNIA REPAIR    . KNEE ARTHROSCOPY Right     OB History    No data available       Home Medications    Prior to Admission medications   Medication Sig Start Date End Date Taking? Authorizing Provider  acetaminophen (TYLENOL) 500 MG tablet Take 1,000 mg by mouth every 6 (six) hours as needed for mild pain.    Historical Provider,  MD  albuterol (PROVENTIL HFA;VENTOLIN HFA) 108 (90 Base) MCG/ACT inhaler Inhale 2 puffs into the lungs every 4 (four) hours as needed for wheezing or shortness of breath. 02/14/16   Janne Napoleon, NP  amLODipine (NORVASC) 10 MG tablet Take 1 tablet (10 mg total) by mouth daily. 02/25/15   Debbe Odea, MD  aspirin EC 81 MG tablet Take 1 tablet (81 mg total) by mouth daily. 02/25/15   Debbe Odea, MD  atorvastatin (LIPITOR) 40 MG tablet Take 1 tablet (40 mg total) by mouth daily at 6 PM. 02/25/15   Debbe Odea, MD  buPROPion (WELLBUTRIN XL) 150 MG 24 hr tablet Take 150 mg by mouth daily.    Historical Provider, MD  diclofenac (VOLTAREN) 75 MG EC tablet Take 75 mg by mouth at bedtime.     Historical Provider, MD  doxycycline (VIBRAMYCIN) 100 MG capsule Take 1 capsule (100 mg total) by mouth 2 (two) times daily. 12/09/15   Robyn Haber, MD  escitalopram (LEXAPRO) 10 MG tablet Take 10 mg by mouth daily.    Historical Provider, MD  fluticasone (FLONASE) 50 MCG/ACT nasal spray Place 2 sprays into both nostrils at bedtime. Patient taking differently: Place 2 sprays into both nostrils 2 (two) times daily as needed (head/ear congestion).  08/19/14   Waldemar Dickens, MD  HYDROcodone-acetaminophen [facility]) [phone]  MG tablet Take 1 tablet by mouth every 6 (six) hours as needed for moderate pain. 12/10/15   Robyn Haber, MD  levothyroxine (SYNTHROID, LEVOTHROID) 150 MCG tablet Take 150 mcg by mouth daily before breakfast.    Historical Provider, MD  losartan-hydrochlorothiazide (HYZAAR) 100-25 MG per tablet Take 1 tablet by mouth daily.    Historical Provider, MD  methocarbamol (ROBAXIN) 500 MG tablet Take 1 tablet (500 mg total) by mouth every 8 (eight) hours as needed for muscle spasms (use mostly at night). 09/12/15   Katy Apo, NP  nitroGLYCERIN (NITROSTAT) 0.4 MG SL tablet Place 1 tablet (0.4 mg total) under the tongue every 5 (five) minutes as needed for chest pain. 02/25/15   Debbe Odea, MD  omeprazole  (PRILOSEC) 20 MG capsule Take 20 mg by mouth daily before lunch.     Historical Provider, MD  predniSONE (DELTASONE) 50 MG tablet 1 tab po daily for 6 days. Take with food. 02/14/16   Janne Napoleon, NP    Family History Family History  Problem Relation Age of Onset  . Hypertension Mother   . Hypertension Father   . Kidney disease Father   . Diverticulosis Father   . Diabetes Sister   . Diabetes Brother     Social History Social History  Substance Use Topics  . Smoking status: Current Every Day Smoker    Packs/day: 0.50    Years: 30.00    Types: Cigarettes  . Smokeless tobacco: Never Used     Comment: 7 cig./day  . Alcohol use No     Comment: infrequently     Allergies   Hydrocodone-homatropine   Review of Systems Review of Systems  Constitutional: Positive for fatigue and fever.  Respiratory: Positive for cough, chest tightness and shortness of breath.   Gastrointestinal: Negative for abdominal pain.  Neurological: Negative for dizziness and weakness.     Physical Exam Updated Vital Signs BP 120/74   Pulse 100   Temp 98.5 F (36.9 C) (Oral)   Resp 17   SpO2 (!) 88%   Physical Exam  Constitutional: She is oriented to person, place, and time. She appears well-developed and well-nourished.  HENT:  Head: Normocephalic and atraumatic.  Eyes: Right eye exhibits no discharge. Left eye exhibits no discharge.  Cardiovascular: Normal rate, regular rhythm and normal heart sounds.   No murmur heard. Pulmonary/Chest: Effort normal and breath sounds normal. She has no wheezes.  Mild tachypnea. 2 Ln O2  Abdominal: Soft. She exhibits no distension. There is no tenderness.  Neurological: She is oriented to person, place, and time.  Skin: Skin is warm and dry. She is not diaphoretic.  Psychiatric: She has a normal mood and affect.  Nursing note and vitals reviewed.    ED Treatments / Results  Labs (all labs ordered are listed, but only abnormal results are  displayed) Labs Reviewed  COMPREHENSIVE METABOLIC PANEL - Abnormal; Notable for the following:       Result Value   Sodium 132 (*)    Potassium 3.0 (*)    Chloride 95 (*)    Calcium 8.6 (*)    Albumin 3.4 (*)    All other components within normal limits  CBC WITH DIFFERENTIAL/PLATELET - Abnormal; Notable for the following:    Hemoglobin 15.4 (*)    All other components within normal limits  INFLUENZA PANEL BY PCR (TYPE A & B, H1N1)  I-STAT TROPOININ, ED  I-STAT CG4 LACTIC ACID, ED    EKG  EKG  Interpretation  Date/Time:  Monday February 17 2016 18:50:51 EST Ventricular Rate:  107 PR Interval:  142 QRS Duration: 74 QT Interval:  348 QTC Calculation: 464 R Axis:   41 Text Interpretation:  Sinus tachycardia Possible Left atrial enlargement Borderline ECG Confirmed by Gerald Leitz ([id number]) on 02/17/2016 9:48:34 PM       Radiology Dg Chest 2 View  Result Date: 02/17/2016 CLINICAL DATA:  Chronic cough, nausea and fever for 4 days EXAM: CHEST  2 VIEW COMPARISON:  02/24/2015 FINDINGS: The heart and mediastinal contours are stable in appearance and within normal limits for size. There is aortic atherosclerosis involving the arch. Diffuse interstitial prominence is noted consistent with bronchitic change. No pneumonic consolidation is noted. No effusion nor suspicious osseous abnormality. IMPRESSION: Bilateral increase in interstitial prominence consistent with mild bronchitic change. Aortic atherosclerosis. Electronically Signed   By: Ashley Royalty M.D.   On: 02/17/2016 19:46    Procedures Procedures (including critical care time)  Medications Ordered in ED Medications  ondansetron (ZOFRAN-ODT) 4 MG disintegrating tablet (not administered)  albuterol (PROVENTIL) (2.5 MG/3ML) 0.083% nebulizer solution 5 mg ( Nebulization Not Given 02/17/16 1923)  ondansetron (ZOFRAN-ODT) disintegrating tablet 4 mg (4 mg Oral Given 02/17/16 1856)  sodium chloride 0.9 % bolus 1,000 mL (1,000 mLs  Intravenous New Bag/Given 02/17/16 2156)  ketorolac (TORADOL) 30 MG/ML injection 30 mg (30 mg Intravenous Given 02/17/16 2156)  ondansetron (ZOFRAN) injection 4 mg (4 mg Intravenous Given 02/17/16 2156)     Initial Impression / Assessment and Plan / ED Course  I have reviewed the triage vital signs and the nursing notes.  Pertinent labs & imaging results that were available during my care of the patient were reviewed by me and considered in my medical decision making (see chart for details).  Clinical Course     Patient is a 55 yo with hypoxia, cough. Patient is 86% on RA, 90% on 2L. No wheezing. Requiring oxygen.  No pna. Flu pending.     Final Clinical Impressions(s) / ED Diagnoses   Final diagnoses:  None    New Prescriptions New Prescriptions   No medications on file     Conley Delisle Julio Alm, MD 02/17/16 2310

## 2016-02-17 NOTE — ED Notes (Signed)
Admitting MD at bedside.

## 2016-02-17 NOTE — ED Triage Notes (Signed)
Pt states seen by UC on Friday for cough and fever. Pt states rx for prednisone and albuterol inhaler. Pt complaining of cough and shortness of breath. Pt complaining of generalized body aches. Pt complaining of 3 episodes of diarrhea today.

## 2016-02-17 NOTE — ED Notes (Signed)
Patient denies pain and is resting comfortably.  

## 2016-02-18 ENCOUNTER — Encounter (HOSPITAL_COMMUNITY): Payer: Self-pay | Admitting: Internal Medicine

## 2016-02-18 DIAGNOSIS — J209 Acute bronchitis, unspecified: Secondary | ICD-10-CM | POA: Diagnosis present

## 2016-02-18 DIAGNOSIS — R531 Weakness: Secondary | ICD-10-CM

## 2016-02-18 HISTORY — DX: Weakness: R53.1

## 2016-02-18 HISTORY — DX: Acute bronchitis, unspecified: J20.9

## 2016-02-18 LAB — D-DIMER, QUANTITATIVE: D-Dimer, Quant: 0.47 ug/mL-FEU (ref 0.00–0.50)

## 2016-02-18 LAB — TSH: TSH: 0.81 u[IU]/mL (ref 0.350–4.500)

## 2016-02-18 LAB — BASIC METABOLIC PANEL
Anion gap: 13 (ref 5–15)
BUN: 14 mg/dL (ref 6–20)
CALCIUM: 8.1 mg/dL — AB (ref 8.9–10.3)
CO2: 23 mmol/L (ref 22–32)
CREATININE: 0.68 mg/dL (ref 0.44–1.00)
Chloride: 97 mmol/L — ABNORMAL LOW (ref 101–111)
GFR calc Af Amer: >60 mL/min (ref >60)
GFR calc non Af Amer: >60 mL/min (ref >60)
GLUCOSE: 94 mg/dL (ref 65–99)
Potassium: 2.7 mmol/L — CL (ref 3.5–5.1)
Sodium: 133 mmol/L — ABNORMAL LOW (ref 135–145)

## 2016-02-18 LAB — TROPONIN I: Troponin I: <0.03 ng/mL (ref <0.03)

## 2016-02-18 LAB — CREATININE, SERUM
CREATININE: 0.79 mg/dL (ref 0.44–1.00)
GFR calc Af Amer: >60 mL/min (ref >60)
GFR calc non Af Amer: >60 mL/min (ref >60)

## 2016-02-18 LAB — INFLUENZA PANEL BY PCR (TYPE A & B)
INFLAPCR: POSITIVE — AB
INFLBPCR: NEGATIVE

## 2016-02-18 LAB — CBC
HCT: 40.1 % (ref 36.0–46.0)
Hemoglobin: 13.6 g/dL (ref 12.0–15.0)
MCH: 29.6 pg (ref 26.0–34.0)
MCHC: 33.9 g/dL (ref 30.0–36.0)
MCV: 87.4 fL (ref 78.0–100.0)
PLATELETS: 252 10*3/uL (ref 150–400)
RBC: 4.59 MIL/uL (ref 3.87–5.11)
RDW: 13.2 % (ref 11.5–15.5)
WBC: 5 10*3/uL (ref 4.0–10.5)

## 2016-02-18 LAB — MAGNESIUM: Magnesium: 1.9 mg/dL (ref 1.7–2.4)

## 2016-02-18 MED ORDER — OSELTAMIVIR PHOSPHATE 75 MG PO CAPS
75.0000 mg | ORAL_CAPSULE | Freq: Two times a day (BID) | ORAL | Status: DC
  Administered 2016-02-18 – 2016-02-19 (×2): 75 mg via ORAL
  Filled 2016-02-18 (×2): qty 1

## 2016-02-18 MED ORDER — METHOCARBAMOL 500 MG PO TABS: 500.0000 mg | ORAL_TABLET | Freq: Three times a day (TID) | ORAL | Status: DC | PRN

## 2016-02-18 MED ORDER — IPRATROPIUM-ALBUTEROL 0.5-2.5 (3) MG/3ML IN SOLN: 3.0000 mL | RESPIRATORY_TRACT | Status: DC | PRN

## 2016-02-18 MED ORDER — AMLODIPINE BESYLATE 5 MG PO TABS
10.0000 mg | ORAL_TABLET | Freq: Every day | ORAL | Status: DC
  Administered 2016-02-18 – 2016-02-19 (×2): 10 mg via ORAL
  Filled 2016-02-18 (×2): qty 2

## 2016-02-18 MED ORDER — POTASSIUM CHLORIDE CRYS ER 20 MEQ PO TBCR
40.0000 meq | EXTENDED_RELEASE_TABLET | Freq: Two times a day (BID) | ORAL | Status: AC
  Administered 2016-02-18 (×2): 40 meq via ORAL
  Filled 2016-02-18 (×2): qty 2

## 2016-02-18 MED ORDER — ACETAMINOPHEN 650 MG RE SUPP: 650.0000 mg | Freq: Four times a day (QID) | RECTAL | Status: DC | PRN

## 2016-02-18 MED ORDER — SODIUM CHLORIDE 0.9 % IV SOLN
30.0000 meq | Freq: Once | INTRAVENOUS | Status: DC
  Administered 2016-02-18: 30 meq via INTRAVENOUS
  Filled 2016-02-18: qty 15

## 2016-02-18 MED ORDER — HYDRALAZINE HCL 20 MG/ML IJ SOLN: 10.0000 mg | INTRAMUSCULAR | Status: DC | PRN

## 2016-02-18 MED ORDER — NITROGLYCERIN 0.4 MG SL SUBL: 0.4000 mg | SUBLINGUAL_TABLET | SUBLINGUAL | Status: DC | PRN

## 2016-02-18 MED ORDER — BUPROPION HCL ER (XL) 150 MG PO TB24
150.0000 mg | ORAL_TABLET | Freq: Every day | ORAL | Status: DC
  Administered 2016-02-18 – 2016-02-19 (×2): 150 mg via ORAL
  Filled 2016-02-18 (×2): qty 1

## 2016-02-18 MED ORDER — ASPIRIN EC 81 MG PO TBEC
81.0000 mg | DELAYED_RELEASE_TABLET | Freq: Every day | ORAL | Status: DC
  Administered 2016-02-18 – 2016-02-19 (×2): 81 mg via ORAL
  Filled 2016-02-18 (×2): qty 1

## 2016-02-18 MED ORDER — INFLUENZA VAC SPLIT QUAD 0.5 ML IM SUSY
0.5000 mL | PREFILLED_SYRINGE | INTRAMUSCULAR | Status: DC
Start: 2016-02-19 — End: 2016-02-19

## 2016-02-18 MED ORDER — ENOXAPARIN SODIUM 40 MG/0.4ML ~~LOC~~ SOLN
40.0000 mg | SUBCUTANEOUS | Status: DC
  Administered 2016-02-18 – 2016-02-19 (×2): 40 mg via SUBCUTANEOUS
  Filled 2016-02-18 (×2): qty 0.4

## 2016-02-18 MED ORDER — DOXYCYCLINE HYCLATE 100 MG IV SOLR
100.0000 mg | Freq: Two times a day (BID) | INTRAVENOUS | Status: DC
  Administered 2016-02-18 (×3): 100 mg via INTRAVENOUS
  Filled 2016-02-18 (×4): qty 100

## 2016-02-18 MED ORDER — FLUTICASONE PROPIONATE 50 MCG/ACT NA SUSP
2.0000 | Freq: Two times a day (BID) | NASAL | Status: DC | PRN
  Filled 2016-02-18: qty 16

## 2016-02-18 MED ORDER — IPRATROPIUM-ALBUTEROL 0.5-2.5 (3) MG/3ML IN SOLN
3.0000 mL | Freq: Three times a day (TID) | RESPIRATORY_TRACT | Status: DC
  Administered 2016-02-18 – 2016-02-19 (×3): 3 mL via RESPIRATORY_TRACT
  Filled 2016-02-18 (×3): qty 3

## 2016-02-18 MED ORDER — LEVOTHYROXINE SODIUM 75 MCG PO TABS
150.0000 ug | ORAL_TABLET | Freq: Every day | ORAL | Status: DC
  Administered 2016-02-18 – 2016-02-19 (×2): 150 ug via ORAL
  Filled 2016-02-18 (×2): qty 2
  Filled 2016-02-18: qty 1

## 2016-02-18 MED ORDER — ACETAMINOPHEN 325 MG PO TABS
650.0000 mg | ORAL_TABLET | Freq: Four times a day (QID) | ORAL | Status: DC | PRN
  Administered 2016-02-18: 650 mg via ORAL
  Filled 2016-02-18: qty 2

## 2016-02-18 MED ORDER — ONDANSETRON HCL 4 MG PO TABS
4.0000 mg | ORAL_TABLET | Freq: Four times a day (QID) | ORAL | Status: DC | PRN
  Administered 2016-02-18 – 2016-02-19 (×2): 4 mg via ORAL
  Filled 2016-02-18 (×2): qty 1

## 2016-02-18 MED ORDER — ATORVASTATIN CALCIUM 40 MG PO TABS
40.0000 mg | ORAL_TABLET | Freq: Every day | ORAL | Status: DC
  Administered 2016-02-18: 40 mg via ORAL
  Filled 2016-02-18: qty 1

## 2016-02-18 MED ORDER — IPRATROPIUM-ALBUTEROL 0.5-2.5 (3) MG/3ML IN SOLN
3.0000 mL | RESPIRATORY_TRACT | Status: DC
  Administered 2016-02-18 (×2): 3 mL via RESPIRATORY_TRACT
  Filled 2016-02-18 (×2): qty 3

## 2016-02-18 MED ORDER — ESCITALOPRAM OXALATE 10 MG PO TABS
10.0000 mg | ORAL_TABLET | Freq: Every day | ORAL | Status: DC
  Administered 2016-02-18 – 2016-02-19 (×2): 10 mg via ORAL
  Filled 2016-02-18 (×2): qty 1

## 2016-02-18 MED ORDER — ONDANSETRON HCL 4 MG/2ML IJ SOLN
4.0000 mg | Freq: Four times a day (QID) | INTRAMUSCULAR | Status: DC | PRN
  Administered 2016-02-18 – 2016-02-19 (×2): 4 mg via INTRAVENOUS
  Filled 2016-02-18 (×2): qty 2

## 2016-02-18 MED ORDER — SODIUM CHLORIDE 0.9 % IV SOLN
INTRAVENOUS | Status: AC
  Administered 2016-02-18: 02:00:00 via INTRAVENOUS

## 2016-02-18 NOTE — Evaluation (Signed)
Physical Therapy Evaluation Patient Details Name: Sheri Villarreal MRN: [id number] DOB: 09-04-1960 Today's Date: 02/18/2016   History of Present Illness   Sheri Villarreal is a 55 y.o. female with history of hypertension, hypothyroidism and tobacco abuse presents to the ER because of worsening shortness of breath.   Clinical Impression  Pt admitted with above diagnosis. Pt currently with functional limitations due to the deficits listed below (see PT Problem List). Pt functioning independently however SpO2 dec to 86% on RA with activity and at rest. Pt at 95% on 2LO2 via [place]. Pt will benefit from skilled PT to increase their independence and safety with mobility to allow discharge to the venue listed below.       Follow Up Recommendations No PT follow up    Equipment Recommendations  None recommended by PT    Recommendations for Other Services       Precautions / Restrictions Precautions Precautions: Other (comment) Precaution Comments: droplet Restrictions Weight Bearing Restrictions: No      Mobility  Bed Mobility Overal bed mobility: Independent                Transfers Overall transfer level: Independent Equipment used: None             General transfer comment: no instability  Ambulation/Gait Ambulation/Gait assistance: Independent Ambulation Distance (Feet): 150 Feet Assistive device: None Gait Pattern/deviations: Step-through pattern;Decreased stride length Gait velocity: decreased Gait velocity interpretation: Below normal speed for age/gender General Gait Details: pt with freq coughing spells, SpO2 at 86% on RA. mild nausea  Stairs            Wheelchair Mobility    Modified Rankin (Stroke Patients Only)       Balance Overall balance assessment: No apparent balance deficits (not formally assessed)                                           Pertinent Vitals/Pain Pain Assessment: 0-10 Pain Score: 2  Pain  Location: headache Pain Descriptors / Indicators: Headache Pain Intervention(s): Monitored during session    Home Living Family/patient expects to be discharged to:: Private residence Living Arrangements: Alone Available Help at Discharge: Family;Friend(s);Available PRN/intermittently Type of Home: House Home Access: Stairs to enter Entrance Stairs-Rails: Chemical engineer of Steps: 12 Home Layout: Two level Home Equipment: None      Prior Function Level of Independence: Independent         Comments: works for the city     [place] Hand: Right    Extremity/Trunk Assessment   Upper Extremity Assessment Upper Extremity Assessment: Overall WFL for tasks assessed    Lower Extremity Assessment Lower Extremity Assessment: Overall WFL for tasks assessed    Cervical / Trunk Assessment Cervical / Trunk Assessment: Normal  Communication   Communication: No difficulties  Cognition Arousal/Alertness: Awake/alert Behavior During Therapy: WFL for tasks assessed/performed Overall Cognitive Status: Within Functional Limits for tasks assessed                      General Comments General comments (skin integrity, edema, etc.): pt able to use bathroom and perform hygiene independently    Exercises     Assessment/Plan    PT Assessment Patent does not need any further PT services  PT Problem List  PT Treatment Interventions      PT Goals (Current goals can be found in the Care Plan section)  Acute Rehab PT Goals Patient Stated Goal: home today PT Goal Formulation: All assessment and education complete, DC therapy    Frequency     Barriers to discharge        Co-evaluation               End of Session Equipment Utilized During Treatment: Oxygen Activity Tolerance: Patient limited by fatigue Patient left: in bed;with call bell/phone within reach;with nursing/sitter in room Nurse Communication: Mobility  status    Functional Assessment Tool Used: clinical judgement Functional Limitation: Mobility: Walking and moving around Mobility: Walking and Moving Around Current Status [id number]): At least 1 percent but less than 20 percent impaired, limited or restricted Mobility: Walking and Moving Around Goal Status [phone]): At least 1 percent but less than 20 percent impaired, limited or restricted    Time: 0730-0800 PT Time Calculation (min) (ACUTE ONLY): 30 min   Charges:   PT Evaluation $PT Eval Low Complexity: 1 Procedure PT Treatments $Gait Training: 8-22 mins   PT G Codes:   PT G-Codes **NOT FOR INPATIENT CLASS** Functional Assessment Tool Used: clinical judgement Functional Limitation: Mobility: Walking and moving around Mobility: Walking and Moving Around Current Status [id number]): At least 1 percent but less than 20 percent impaired, limited or restricted Mobility: Walking and Moving Around Goal Status [phone]): At least 1 percent but less than 20 percent impaired, limited or restricted    Berline Lopes 02/18/2016, 8:17 AM   Kittie Plater, PT, DPT Pager #: [phone] Office #: [phone]

## 2016-02-18 NOTE — H&P (Signed)
History and Physical    Sheri Villarreal [id number] DOB: 11/19/1960 DOA: 02/17/2016  PCP: Gerrit Heck, MD  Patient coming from: Home.  Chief Complaint: Shortness of breath and weakness. Nausea vomiting.  HPI: Sheri Villarreal is a 55 y.o. female with history of hypertension, hypothyroidism and tobacco abuse presents to the ER because of worsening shortness of breath. Shortness of breath is at rest and denies any exertional symptoms or chest pain. Patient states she has been fever and chills with cough since Friday, 4 days ago. Patient had gone to urgent care center and was prescribed prednisone which patient took for 2 days. Unable take today because of nausea. Last 2 days also patient had multiple episodes of vomiting and was unable to eat and did not have any episodes today. Denies any diarrhea or abdominal pain. Patient had persistent cough and had recorded temperatures of around 101F at home. Due to weakness and persistent cough and shortness of breath patient came to the ER.    ED Course: In the ER patient was given 2 L normal saline bolus influenza PCR was ordered. Chest x-ray shows bronchitic changes. On exam patient has mild coarse breath sounds and patient will be admitted for further observation since patient has nausea and unable to eat well and shortness of breath.  Review of Systems: As per HPI, rest all negative.   Past Medical History:  Diagnosis Date  . Arthritis    knees  . Carpal tunnel syndrome of right wrist 06/2013  . Dental crowns present   . Depression   . GERD (gastroesophageal reflux disease)   . History of hyperthyroidism   . Hypertension    under control with med., has been on med. x 9 yr.  . Tobacco abuse     Past Surgical History:  Procedure Laterality Date  . APPENDECTOMY    . CARPAL TUNNEL RELEASE Right 07/04/2013   Procedure: RIGHT CARPAL TUNNEL RELEASE;  Surgeon: Wynonia Sours, MD;  Location: [facility];   Service: Orthopedics;  Laterality: Right;  . INGUINAL HERNIA REPAIR    . KNEE ARTHROSCOPY Right      reports that she has been smoking Cigarettes.  She has a 15.00 pack-year smoking history. She has never used smokeless tobacco. She reports that she does not drink alcohol or use drugs.  Allergies  Allergen Reactions  . Hydrocodone-Homatropine Itching    Reaction to Hycodan cough syrup (pt has taken norco in the past with no reaction)    Family History  Problem Relation Age of Onset  . Hypertension Mother   . Hypertension Father   . Kidney disease Father   . Diverticulosis Father   . Diabetes Sister   . Diabetes Brother     Prior to Admission medications   Medication Sig Start Date End Date Taking? Authorizing Provider  acetaminophen (TYLENOL) 500 MG tablet Take 1,000 mg by mouth every 6 (six) hours as needed for mild pain.    Historical Provider, MD  albuterol (PROVENTIL HFA;VENTOLIN HFA) 108 (90 Base) MCG/ACT inhaler Inhale 2 puffs into the lungs every 4 (four) hours as needed for wheezing or shortness of breath. 02/14/16   Janne Napoleon, NP  amLODipine (NORVASC) 10 MG tablet Take 1 tablet (10 mg total) by mouth daily. 02/25/15   Debbe Odea, MD  aspirin EC 81 MG tablet Take 1 tablet (81 mg total) by mouth daily. 02/25/15   Debbe Odea, MD  atorvastatin (LIPITOR) 40 MG tablet Take 1 tablet (40  mg total) by mouth daily at 6 PM. 02/25/15   Debbe Odea, MD  buPROPion (WELLBUTRIN XL) 150 MG 24 hr tablet Take 150 mg by mouth daily.    Historical Provider, MD  diclofenac (VOLTAREN) 75 MG EC tablet Take 75 mg by mouth at bedtime.     Historical Provider, MD  doxycycline (VIBRAMYCIN) 100 MG capsule Take 1 capsule (100 mg total) by mouth 2 (two) times daily. 12/09/15   Robyn Haber, MD  escitalopram (LEXAPRO) 10 MG tablet Take 10 mg by mouth daily.    Historical Provider, MD  fluticasone (FLONASE) 50 MCG/ACT nasal spray Place 2 sprays into both nostrils at bedtime. Patient taking differently:  Place 2 sprays into both nostrils 2 (two) times daily as needed (head/ear congestion).  08/19/14   Waldemar Dickens, MD  HYDROcodone-acetaminophen (NORCO) 5-325 MG tablet Take 1 tablet by mouth every 6 (six) hours as needed for moderate pain. 12/10/15   Robyn Haber, MD  levothyroxine (SYNTHROID, LEVOTHROID) 150 MCG tablet Take 150 mcg by mouth daily before breakfast.    Historical Provider, MD  losartan-hydrochlorothiazide (HYZAAR) 100-25 MG per tablet Take 1 tablet by mouth daily.    Historical Provider, MD  methocarbamol (ROBAXIN) 500 MG tablet Take 1 tablet (500 mg total) by mouth every 8 (eight) hours as needed for muscle spasms (use mostly at night). 09/12/15   Katy Apo, NP  nitroGLYCERIN (NITROSTAT) 0.4 MG SL tablet Place 1 tablet (0.4 mg total) under the tongue every 5 (five) minutes as needed for chest pain. 02/25/15   Debbe Odea, MD  omeprazole (PRILOSEC) 20 MG capsule Take 20 mg by mouth daily before lunch.     Historical Provider, MD  predniSONE (DELTASONE) 50 MG tablet 1 tab po daily for 6 days. Take with food. 02/14/16   Janne Napoleon, NP    Physical Exam: Vitals:   02/17/16 2115 02/17/16 2145 02/17/16 2215 02/17/16 2245  BP: 118/79 128/76 116/69 106/70  Pulse: 97 97 84 84  Resp: 19 25 18 23   Temp:      TempSrc:      SpO2: 94% 90% 92% 91%      Constitutional: Moderately built and nourished. Vitals:   02/17/16 2115 02/17/16 2145 02/17/16 2215 02/17/16 2245  BP: 118/79 128/76 116/69 106/70  Pulse: 97 97 84 84  Resp: 19 25 18 23   Temp:      TempSrc:      SpO2: 94% 90% 92% 91%   Eyes: Anicteric. No pallor. ENMT: No discharge from the ears eyes nose or mouth. Neck: No JVD appreciated no mass felt. Respiratory: Bilateral coarse breath sounds. Cardiovascular: S1-S2 heard no murmur appreciated. Abdomen: Soft nontender bowel sounds present. No guarding or rigidity. Musculoskeletal: No edema. No joint effusion. Skin: No rash. Skin appears warm. Neurologic: Alert awake  oriented to time place and person. Moves all extremities. Psychiatric: Appears normal. Normal affect.   Labs on Admission: I have personally reviewed following labs and imaging studies  CBC:  Recent Labs Lab 02/17/16 2140  WBC 8.4  NEUTROABS 6.5  HGB 15.4*  HCT 44.6  MCV 89.0  PLT 123456   Basic Metabolic Panel:  Recent Labs Lab 02/17/16 2140  NA 132*  K 3.0*  CL 95*  CO2 23  GLUCOSE 97  BUN 16  CREATININE 0.80  CALCIUM 8.6*   GFR: Estimated Creatinine Clearance: 91.4 mL/min (by C-G formula based on SCr of 0.8 mg/dL). Liver Function Tests:  Recent Labs Lab 02/17/16 2140  AST 29  ALT 19  ALKPHOS 69  BILITOT 0.8  PROT 7.0  ALBUMIN 3.4*   No results for input(s): LIPASE, AMYLASE in the last 168 hours. No results for input(s): AMMONIA in the last 168 hours. Coagulation Profile: No results for input(s): INR, PROTIME in the last 168 hours. Cardiac Enzymes: No results for input(s): CKTOTAL, CKMB, CKMBINDEX, TROPONINI in the last 168 hours. BNP (last 3 results) No results for input(s): PROBNP in the last 8760 hours. HbA1C: No results for input(s): HGBA1C in the last 72 hours. CBG: No results for input(s): GLUCAP in the last 168 hours. Lipid Profile: No results for input(s): CHOL, HDL, LDLCALC, TRIG, CHOLHDL, LDLDIRECT in the last 72 hours. Thyroid Function Tests: No results for input(s): TSH, T4TOTAL, FREET4, T3FREE, THYROIDAB in the last 72 hours. Anemia Panel: No results for input(s): VITAMINB12, FOLATE, FERRITIN, TIBC, IRON, RETICCTPCT in the last 72 hours. Urine analysis:    Component Value Date/Time   COLORURINE YELLOW 09/01/2014 2004   APPEARANCEUR CLEAR 09/01/2014 2004   LABSPEC 1.019 09/01/2014 2004   PHURINE 5.5 09/01/2014 2004   GLUCOSEU NEGATIVE 09/01/2014 2004   HGBUR NEGATIVE 09/01/2014 2004   BILIRUBINUR NEGATIVE 09/01/2014 2004   [place] NEGATIVE 09/01/2014 2004   PROTEINUR NEGATIVE 09/01/2014 2004   UROBILINOGEN 0.2 09/01/2014 2004    NITRITE NEGATIVE 09/01/2014 2004   LEUKOCYTESUR NEGATIVE 09/01/2014 2004   Sepsis Labs: @LABRCNTIP (procalcitonin:4,lacticidven:4) )No results found for this or any previous visit (from the past 240 hour(s)).   Radiological Exams on Admission: Dg Chest 2 View  Result Date: 02/17/2016 CLINICAL DATA:  Chronic cough, nausea and fever for 4 days EXAM: CHEST  2 VIEW COMPARISON:  02/24/2015 FINDINGS: The heart and mediastinal contours are stable in appearance and within normal limits for size. There is aortic atherosclerosis involving the arch. Diffuse interstitial prominence is noted consistent with bronchitic change. No pneumonic consolidation is noted. No effusion nor suspicious osseous abnormality. IMPRESSION: Bilateral increase in interstitial prominence consistent with mild bronchitic change. Aortic atherosclerosis. Electronically Signed   By: Ashley Royalty M.D.   On: 02/17/2016 19:46    EKG: Independently reviewed. Sinus tachycardia.  Assessment/Plan Principal Problem:   Acute bronchitis Active Problems:   Hypothyroidism   Essential hypertension   Weakness    1. Short of breath likely from bronchitis - for now I placed patient on nebulizer and antibiotics. Will also check d-dimer. Closely observe in telemetry. Influenza PCR is pending. 2. Weakness with nausea vomiting - probably from dehydration. No further episodes of vomiting since patient has not been eating well. Abdomen appears benign. Continue to hydrate. Follow metabolic panel. Check TSH. 3. Hypertension - I'm holding off patient's lisinopril and diuretic since the process in the low-normal. Continue amlodipine. Closely follow blood pressure trends. 4. Hypothyroidism on Synthroid. 5. Tobacco abuse - tobacco cessation counseling requested.   DVT prophylaxis: Lovenox. Code Status: Full code.  Family Communication: Discussed with patient.  Disposition Plan: Home.  Consults called: Physical therapy.  Admission status:  Observation.    Rise Patience MD [facility] Pager [phone].  If 7PM-7AM, please contact night-coverage [email or web address] Password [facility]  02/18/2016, 12:07 AM

## 2016-02-18 NOTE — Progress Notes (Signed)
Liliahna Marquita Levitt is a 55 y.o. female with history of hypertension, hypothyroidism and tobacco abuse presents to the ER because of worsening shortness of breath associated with bronchitis. Currently on IV antibiotics . She was also found to be hypokalemic, getting IV potassium.  On exam  She is alert and oriented on 2 lit of [place] oxygen.  CVS s1s2, RRR,  LUNGS clear to auscultation, no wheezing or rhonchi Abdomen: soft non tender non distended bowel sounds good.  Extremities no pedal edema.  Neuro non focal.   Monitor for 24 hours and she should be able to go home in am. Wean her off oxygen today.    Hosie Poisson, MD [phone]

## 2016-02-19 DIAGNOSIS — J209 Acute bronchitis, unspecified: Secondary | ICD-10-CM

## 2016-02-19 LAB — BASIC METABOLIC PANEL
Anion gap: 9 (ref 5–15)
CHLORIDE: 100 mmol/L — AB (ref 101–111)
CO2: 25 mmol/L (ref 22–32)
Calcium: 8.3 mg/dL — ABNORMAL LOW (ref 8.9–10.3)
Creatinine, Ser: 0.58 mg/dL (ref 0.44–1.00)
GFR calc Af Amer: >60 mL/min (ref >60)
GFR calc non Af Amer: >60 mL/min (ref >60)
Glucose, Bld: 126 mg/dL — ABNORMAL HIGH (ref 65–99)
POTASSIUM: 3.3 mmol/L — AB (ref 3.5–5.1)
SODIUM: 134 mmol/L — AB (ref 135–145)

## 2016-02-19 LAB — MAGNESIUM: MAGNESIUM: 1.9 mg/dL (ref 1.7–2.4)

## 2016-02-19 MED ORDER — DOXYCYCLINE HYCLATE 100 MG PO CAPS: 100.0000 mg | ORAL_CAPSULE | Freq: Two times a day (BID) | ORAL | 0 refills | Status: DC

## 2016-02-19 MED ORDER — DOXYCYCLINE HYCLATE 100 MG PO TABS
100.0000 mg | ORAL_TABLET | Freq: Two times a day (BID) | ORAL | Status: DC
  Administered 2016-02-19: 100 mg via ORAL
  Filled 2016-02-19 (×2): qty 1

## 2016-02-19 MED ORDER — PREDNISONE 50 MG PO TABS: ORAL_TABLET | ORAL | 0 refills | Status: DC

## 2016-02-19 MED ORDER — OSELTAMIVIR PHOSPHATE 75 MG PO CAPS: 75.0000 mg | ORAL_CAPSULE | Freq: Two times a day (BID) | ORAL | 0 refills | Status: DC

## 2016-02-19 MED ORDER — POTASSIUM CHLORIDE CRYS ER 20 MEQ PO TBCR: 40.0000 meq | EXTENDED_RELEASE_TABLET | Freq: Two times a day (BID) | ORAL | 0 refills | Status: DC

## 2016-02-19 NOTE — Discharge Summary (Addendum)
Physician Discharge Summary  Sheri Villarreal [id number] DOB: May 12, 1960 DOA: 02/17/2016  PCP: Gerrit Heck, MD  Admit date: 02/17/2016 Discharge date: 02/19/2016  Time spent: 35 minutes  Recommendations for Outpatient Follow-up:  1. Will need to complete course of doxycycline as an outpatient 2. Continue K Dur 40 mEq twice a day and get a metabolic panel in about one week 3. No oxygen requirements on discharge  Discharge Diagnoses:  Principal Problem:   Acute bronchitis Active Problems:   Hypothyroidism   Essential hypertension   Weakness   Discharge Condition: Improved  Diet recommendation: Heart healthy  Filed Weights   02/18/16 0127  Weight: 105.2 kg (231 lb 14.4 oz)    History of present illness:  39 ? Known history of HTN Hypothyroid Current tobacco use  Admitted 02/18/2016 with 4 day history of fever or chills cough and nausea-these all seem to occur together She had temperatures up to about 10 46F at home and came to the emergency room and was given saline bolus Her H influenza was positive and she was started on Tamiflu and doxycycline  Hospital Course:   Patient was kept in the hospital and was treated with IV fluids and oral as well as oral antivirals Tamiflu He was noted to be severely hypokalemic with a potassium of 2.7 and this was replaced IV initially She has had some further nausea and abdominal discomfort but this is not unusual for her in the mornings.  Found to have positive flu test for H influenza a  She felt overall somewhat better during the hospital stay and was discharged in stable state with prescription for doxycycline Tamiflu as well as potassium on discharge  Consultations:  None  Discharge Exam: Vitals:   02/18/16 2000 02/19/16 0508  BP: (!) 104/91 132/73  Pulse: 95 83  Resp: 18 20  Temp: 99.5 F (37.5 C) 98.4 F (36.9 C)    General: Alert pleasant oriented no apparent distress Cardiovascular: S1 and  S2 no murmur rub or gallop Respiratory: Clinically clear with decreased breath sounds anteriorly but some mild wheezes bilaterally  Discharge Instructions    Current Discharge Medication List    START taking these medications   Details  oseltamivir (TAMIFLU) 75 MG capsule Take 1 capsule (75 mg total) by mouth 2 (two) times daily. Qty: 6 capsule, Refills: 0    potassium chloride SA (K-DUR,KLOR-CON) 20 MEQ tablet Take 2 tablets (40 mEq total) by mouth 2 (two) times daily. Qty: 30 tablet, Refills: 0      CONTINUE these medications which have CHANGED   Details  doxycycline (VIBRAMYCIN) 100 MG capsule Take 1 capsule (100 mg total) by mouth 2 (two) times daily. Qty: 6 capsule, Refills: 0    predniSONE (DELTASONE) 50 MG tablet 1 tab po daily for 6 days. Take with food. Qty: 6 tablet, Refills: 0      CONTINUE these medications which have NOT CHANGED   Details  acetaminophen (TYLENOL) 500 MG tablet Take 1,000 mg by mouth every 6 (six) hours as needed for mild pain.    albuterol (PROVENTIL HFA;VENTOLIN HFA) 108 (90 Base) MCG/ACT inhaler Inhale 2 puffs into the lungs every 4 (four) hours as needed for wheezing or shortness of breath. Qty: 1 Inhaler, Refills: 0    amLODipine (NORVASC) 10 MG tablet Take 1 tablet (10 mg total) by mouth daily. Qty: 30 tablet, Refills: 0    aspirin EC 81 MG tablet Take 1 tablet (81 mg total) by mouth daily.  atorvastatin (LIPITOR) 40 MG tablet Take 1 tablet (40 mg total) by mouth daily at 6 PM. Qty: 30 tablet, Refills: 0    buPROPion (WELLBUTRIN XL) 150 MG 24 hr tablet Take 150 mg by mouth daily.    escitalopram (LEXAPRO) 10 MG tablet Take 10 mg by mouth daily.    fluticasone (FLONASE) 50 MCG/ACT nasal spray Place 2 sprays into both nostrils at bedtime. Qty: 16 g, Refills: 0    levothyroxine (SYNTHROID, LEVOTHROID) 150 MCG tablet Take 150 mcg by mouth daily before breakfast.    losartan-hydrochlorothiazide (HYZAAR) 100-25 MG per tablet Take 1  tablet by mouth daily.    Magnesium 250 MG TABS Take 250 mg by mouth at bedtime.    nitroGLYCERIN (NITROSTAT) 0.4 MG SL tablet Place 1 tablet (0.4 mg total) under the tongue every 5 (five) minutes as needed for chest pain. Qty: 30 tablet, Refills: 0    omeprazole (PRILOSEC) 20 MG capsule Take 20 mg by mouth daily before lunch.       STOP taking these medications     HYDROcodone-acetaminophen (NORCO) 5-325 MG tablet      methocarbamol (ROBAXIN) 500 MG tablet      diclofenac (VOLTAREN) 75 MG EC tablet        Allergies  Allergen Reactions  . Hydrocodone-Homatropine Itching    Reaction to Hycodan cough syrup (pt has taken norco in the past with no reaction)      The results of significant diagnostics from this hospitalization (including imaging, microbiology, ancillary and laboratory) are listed below for reference.    Significant Diagnostic Studies: Dg Chest 2 View  Result Date: 02/17/2016 CLINICAL DATA:  Chronic cough, nausea and fever for 4 days EXAM: CHEST  2 VIEW COMPARISON:  02/24/2015 FINDINGS: The heart and mediastinal contours are stable in appearance and within normal limits for size. There is aortic atherosclerosis involving the arch. Diffuse interstitial prominence is noted consistent with bronchitic change. No pneumonic consolidation is noted. No effusion nor suspicious osseous abnormality. IMPRESSION: Bilateral increase in interstitial prominence consistent with mild bronchitic change. Aortic atherosclerosis. Electronically Signed   By: Ashley Royalty M.D.   On: 02/17/2016 19:46    Microbiology: No results found for this or any previous visit (from the past 240 hour(s)).   Labs: Basic Metabolic Panel:  Recent Labs Lab 02/17/16 2140 02/18/16 0012 02/18/16 0625 02/18/16 1140 02/19/16 0819  NA 132*  --  133*  --  134*  K 3.0*  --  2.7*  --  3.3*  CL 95*  --  97*  --  100*  CO2 23  --  23  --  25  GLUCOSE 97  --  94  --  126*  BUN 16  --  14  --  <5*   CREATININE 0.80 0.79 0.68  --  0.58  CALCIUM 8.6*  --  8.1*  --  8.3*  MG  --   --   --  1.9 1.9   Liver Function Tests:  Recent Labs Lab 02/17/16 2140  AST 29  ALT 19  ALKPHOS 69  BILITOT 0.8  PROT 7.0  ALBUMIN 3.4*   No results for input(s): LIPASE, AMYLASE in the last 168 hours. No results for input(s): AMMONIA in the last 168 hours. CBC:  Recent Labs Lab 02/17/16 2140 02/18/16 0625  WBC 8.4 5.0  NEUTROABS 6.5  --   HGB 15.4* 13.6  HCT 44.6 40.1  MCV 89.0 87.4  PLT 273 252   Cardiac Enzymes:  Recent Labs Lab 02/18/16 0012 02/18/16 0625 02/18/16 1140  TROPONINI <0.03 <0.03 <0.03   BNP: BNP (last 3 results) No results for input(s): BNP in the last 8760 hours.  ProBNP (last 3 results) No results for input(s): PROBNP in the last 8760 hours.  CBG: No results for input(s): GLUCAP in the last 168 hours.     SignedNita Sells MD   [facility] 02/19/2016, 9:58 AM

## 2016-02-19 NOTE — Progress Notes (Signed)
02/19/2016 1:53 PM Discharge AVS meds taken today and those due this evening reviewed.  Follow-up appointments and when to call md reviewed.  D/C IV and TELE.  Questions and concerns addressed.   D/C home per orders. [place]

## 2016-03-03 DIAGNOSIS — E890 Postprocedural hypothyroidism: Secondary | ICD-10-CM | POA: Diagnosis not present

## 2016-03-03 DIAGNOSIS — E876 Hypokalemia: Secondary | ICD-10-CM | POA: Diagnosis not present

## 2016-05-26 IMAGING — CR DG CHEST 2V
2 series · 2 of 2 positions shown · non-contrast
Comparison: 07/08/2009

CLINICAL DATA: Chest pain.  Shortness of breath.

EXAM:
CHEST  2 VIEW

[chest pa]
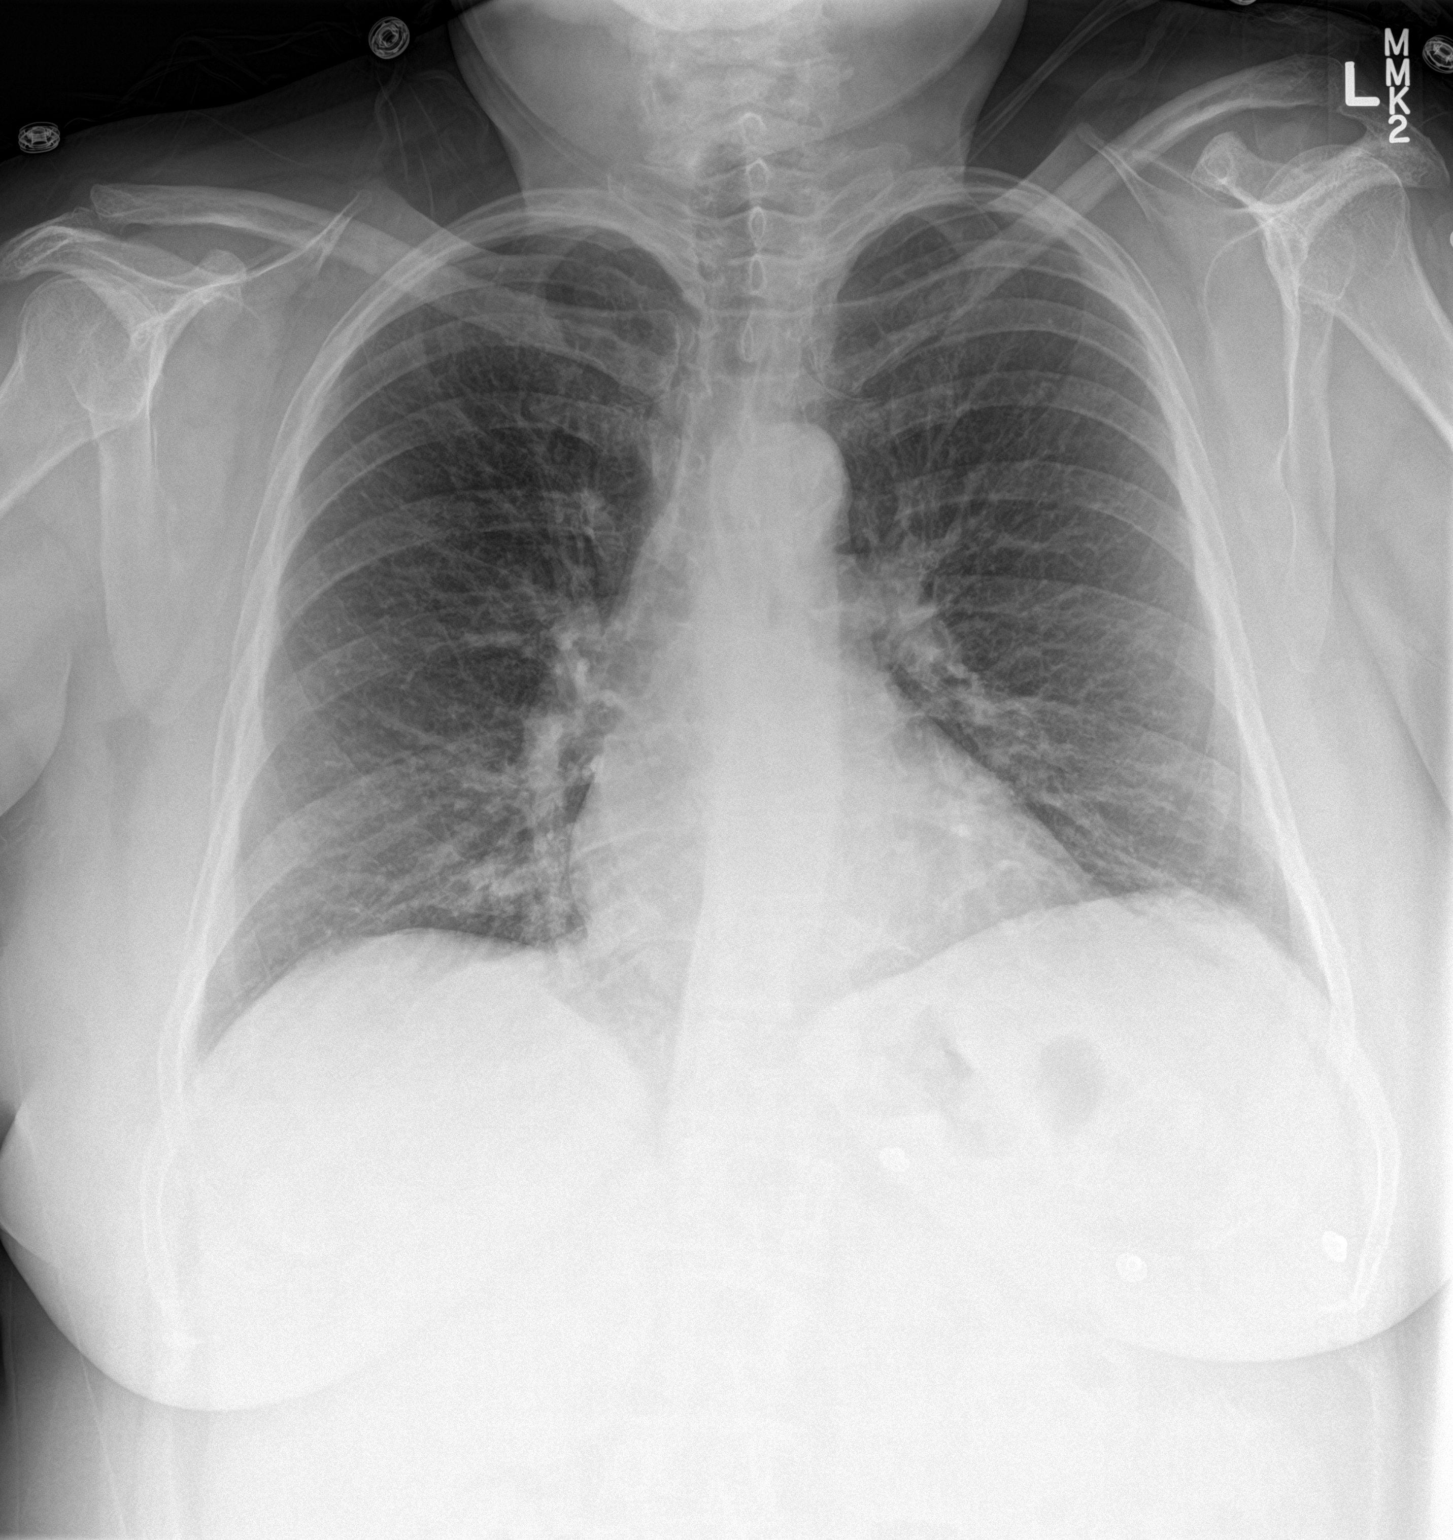

[chest lat]
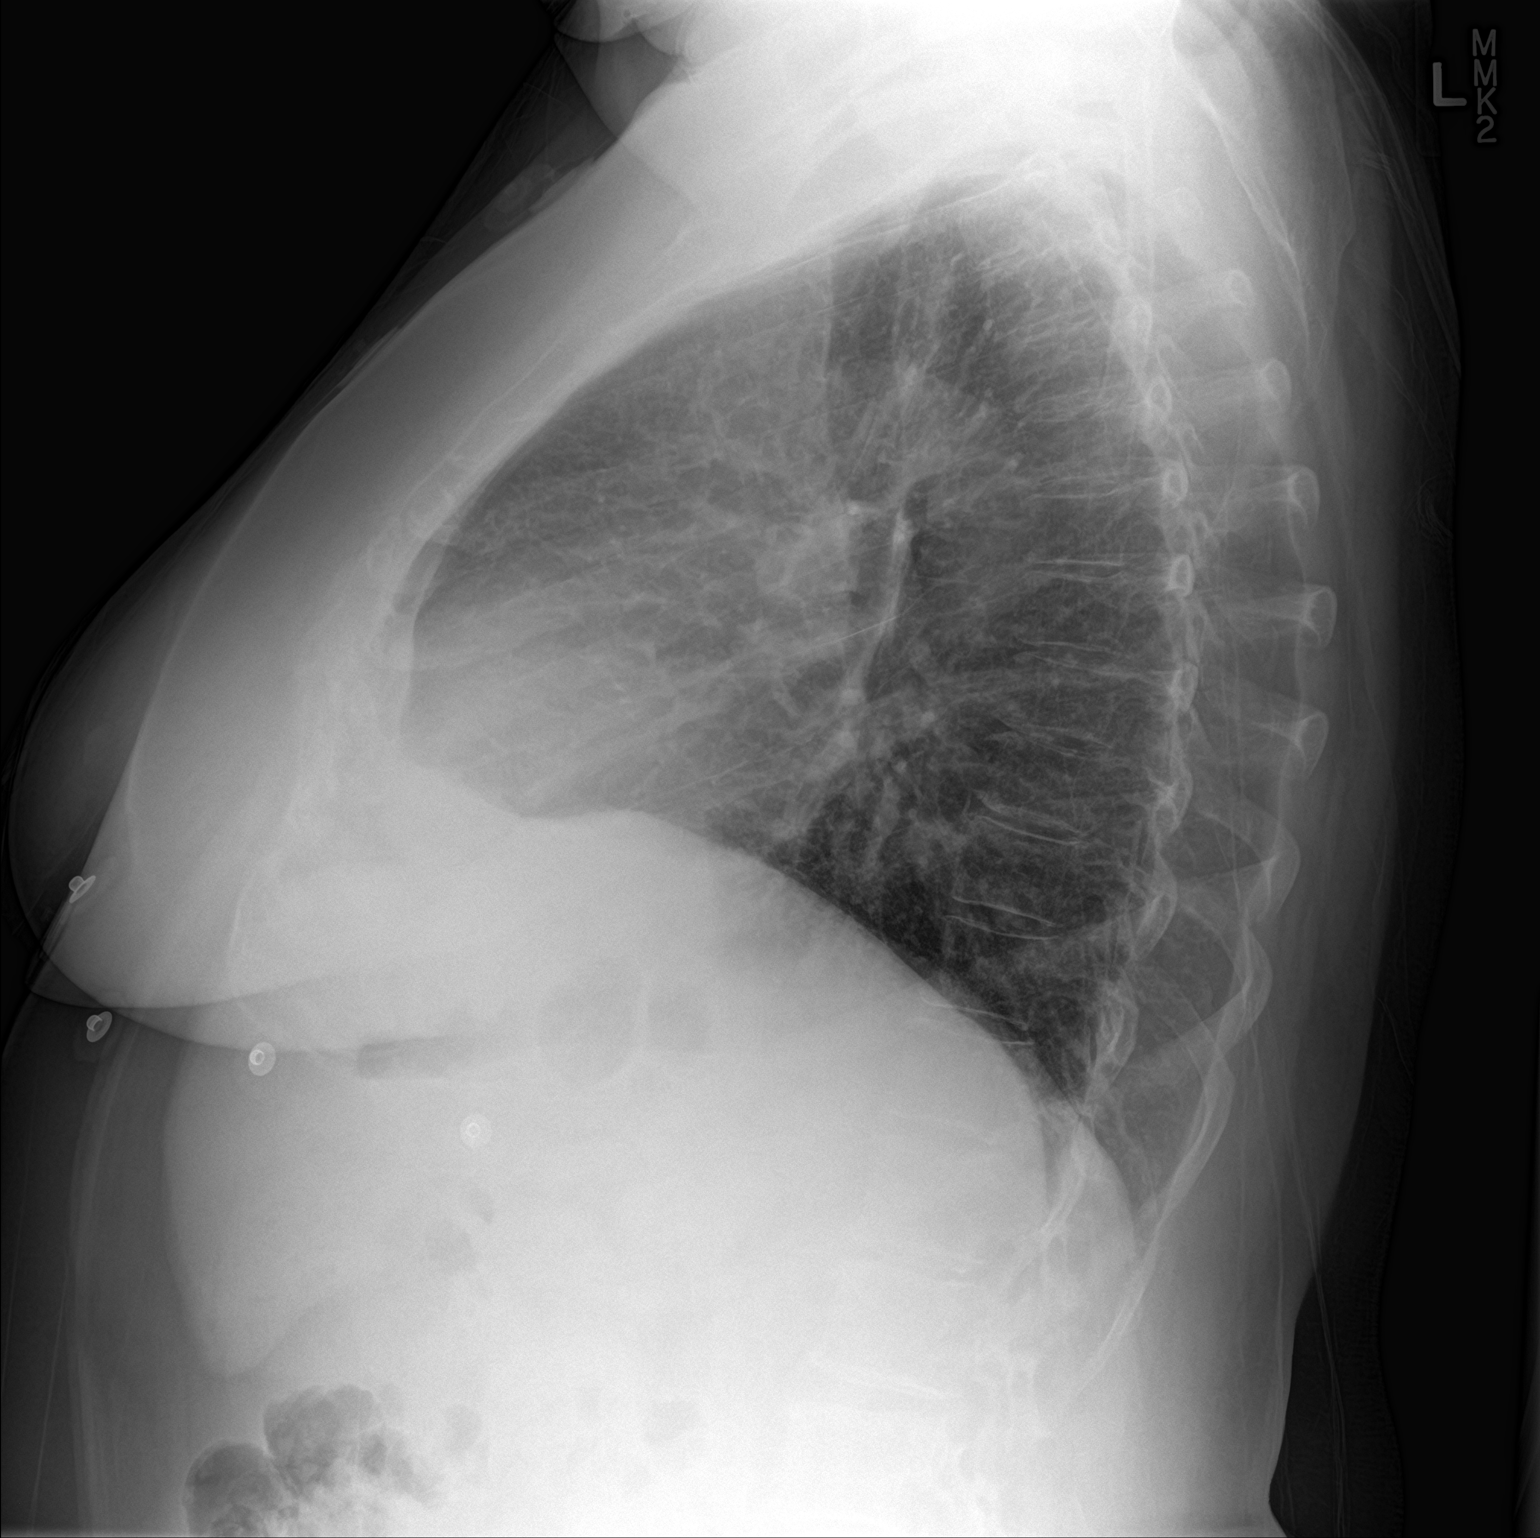

[2 of 2 positions shown; findings below may reference images not displayed]

FINDINGS: The heart size and mediastinal contours are within normal limits.
Both lungs are clear. The visualized skeletal structures are
unremarkable.
IMPRESSION: Normal exam.

## 2016-11-10 DIAGNOSIS — E0500 Thyrotoxicosis with diffuse goiter without thyrotoxic crisis or storm: Secondary | ICD-10-CM | POA: Diagnosis not present

## 2016-11-10 DIAGNOSIS — E890 Postprocedural hypothyroidism: Secondary | ICD-10-CM | POA: Diagnosis not present

## 2016-11-16 ENCOUNTER — Encounter (HOSPITAL_COMMUNITY): Payer: Self-pay

## 2016-11-16 ENCOUNTER — Emergency Department (HOSPITAL_COMMUNITY)
Admission: EM | Admit: 2016-11-16 | Discharge: 2016-11-16 | Disposition: A | Discharge status: Home / Self Care | Payer: 59 | Attending: Emergency Medicine | Admitting: Emergency Medicine

## 2016-11-16 ENCOUNTER — Emergency Department (HOSPITAL_COMMUNITY): Payer: 59

## 2016-11-16 DIAGNOSIS — F17210 Nicotine dependence, cigarettes, uncomplicated: Secondary | ICD-10-CM | POA: Insufficient documentation

## 2016-11-16 DIAGNOSIS — R51 Headache: Secondary | ICD-10-CM | POA: Insufficient documentation

## 2016-11-16 DIAGNOSIS — E079 Disorder of thyroid, unspecified: Secondary | ICD-10-CM | POA: Insufficient documentation

## 2016-11-16 DIAGNOSIS — B349 Viral infection, unspecified: Secondary | ICD-10-CM | POA: Diagnosis not present

## 2016-11-16 DIAGNOSIS — R519 Headache, unspecified: Secondary | ICD-10-CM

## 2016-11-16 DIAGNOSIS — R1110 Vomiting, unspecified: Secondary | ICD-10-CM | POA: Diagnosis present

## 2016-11-16 DIAGNOSIS — I10 Essential (primary) hypertension: Secondary | ICD-10-CM | POA: Insufficient documentation

## 2016-11-16 DIAGNOSIS — Z79899 Other long term (current) drug therapy: Secondary | ICD-10-CM | POA: Diagnosis not present

## 2016-11-16 DIAGNOSIS — R070 Pain in throat: Secondary | ICD-10-CM | POA: Diagnosis not present

## 2016-11-16 DIAGNOSIS — Z7982 Long term (current) use of aspirin: Secondary | ICD-10-CM | POA: Diagnosis not present

## 2016-11-16 DIAGNOSIS — R110 Nausea: Secondary | ICD-10-CM | POA: Insufficient documentation

## 2016-11-16 HISTORY — DX: Disorder of thyroid, unspecified: E07.9

## 2016-11-16 LAB — CBC
HEMATOCRIT: 44.7 % (ref 36.0–46.0)
HEMOGLOBIN: 15 g/dL (ref 12.0–15.0)
MCH: 31.4 pg (ref 26.0–34.0)
MCHC: 33.6 g/dL (ref 30.0–36.0)
MCV: 93.7 fL (ref 78.0–100.0)
Platelets: 311 10*3/uL (ref 150–400)
RBC: 4.77 MIL/uL (ref 3.87–5.11)
RDW: 13.2 % (ref 11.5–15.5)
WBC: 8.2 10*3/uL (ref 4.0–10.5)

## 2016-11-16 LAB — URINALYSIS, ROUTINE W REFLEX MICROSCOPIC
BILIRUBIN URINE: NEGATIVE
GLUCOSE, UA: NEGATIVE mg/dL
HGB URINE DIPSTICK: NEGATIVE
Ketones, ur: NEGATIVE mg/dL
Leukocytes, UA: NEGATIVE
NITRITE: NEGATIVE
PH: 6 (ref 5.0–8.0)
Protein, ur: NEGATIVE mg/dL
SPECIFIC GRAVITY, URINE: 1.018 (ref 1.005–1.030)

## 2016-11-16 LAB — LIPASE, BLOOD: Lipase: 34 U/L (ref 11–51)

## 2016-11-16 LAB — COMPREHENSIVE METABOLIC PANEL
ALBUMIN: 3.9 g/dL (ref 3.5–5.0)
ALT: 16 U/L (ref 14–54)
ANION GAP: 9 (ref 5–15)
AST: 20 U/L (ref 15–41)
Alkaline Phosphatase: 89 U/L (ref 38–126)
BUN: 8 mg/dL (ref 6–20)
CHLORIDE: 100 mmol/L — AB (ref 101–111)
CO2: 26 mmol/L (ref 22–32)
Calcium: 8.7 mg/dL — ABNORMAL LOW (ref 8.9–10.3)
Creatinine, Ser: 0.74 mg/dL (ref 0.44–1.00)
GFR calc Af Amer: >60 mL/min (ref >60)
GFR calc non Af Amer: >60 mL/min (ref >60)
GLUCOSE: 96 mg/dL (ref 65–99)
POTASSIUM: 3.6 mmol/L (ref 3.5–5.1)
SODIUM: 135 mmol/L (ref 135–145)
Total Bilirubin: 0.7 mg/dL (ref 0.3–1.2)
Total Protein: 7.3 g/dL (ref 6.5–8.1)

## 2016-11-16 LAB — INFLUENZA PANEL BY PCR (TYPE A & B)
INFLAPCR: NEGATIVE
Influenza B By PCR: NEGATIVE

## 2016-11-16 LAB — RAPID STREP SCREEN (MED CTR MEBANE ONLY): Streptococcus, Group A Screen (Direct): NEGATIVE

## 2016-11-16 MED ORDER — SODIUM CHLORIDE 0.9 % IV BOLUS (SEPSIS)
1000.0000 mL | Freq: Once | INTRAVENOUS | Status: AC
  Administered 2016-11-16: 1000 mL via INTRAVENOUS

## 2016-11-16 MED ORDER — METOCLOPRAMIDE HCL 5 MG/ML IJ SOLN
10.0000 mg | Freq: Once | INTRAMUSCULAR | Status: AC
  Administered 2016-11-16: 10 mg via INTRAVENOUS
  Filled 2016-11-16: qty 2

## 2016-11-16 MED ORDER — ONDANSETRON 4 MG PO TBDP
ORAL_TABLET | ORAL | Status: AC
  Filled 2016-11-16: qty 1

## 2016-11-16 MED ORDER — IOPAMIDOL (ISOVUE-370) INJECTION 76%
INTRAVENOUS | Status: AC
  Administered 2016-11-16: 50 mL
  Filled 2016-11-16: qty 50

## 2016-11-16 MED ORDER — DIPHENHYDRAMINE HCL 50 MG/ML IJ SOLN
25.0000 mg | Freq: Once | INTRAMUSCULAR | Status: AC
  Administered 2016-11-16: 25 mg via INTRAVENOUS
  Filled 2016-11-16: qty 1

## 2016-11-16 MED ORDER — ONDANSETRON 4 MG PO TBDP
4.0000 mg | ORAL_TABLET | Freq: Once | ORAL | Status: AC | PRN
  Administered 2016-11-16: 4 mg via ORAL

## 2016-11-16 MED ORDER — ACETAMINOPHEN 325 MG PO TABS: 650.0000 mg | ORAL_TABLET | Freq: Once | ORAL | Status: DC | PRN

## 2016-11-16 NOTE — ED Provider Notes (Signed)
[facility] DEPT Provider Note   CSN: [id number] Arrival date & time: 11/16/16  1233     History   Chief Complaint Chief Complaint  Patient presents with  . Emesis  . Sore Throat  . Nausea  . Headache    HPI Sheri Villarreal is a 56 y.o. female.  The history is provided by the patient, a friend and medical records. No language interpreter was used.   51 yoF h/o chronic recurrent headaches, HTN, hyperthyroidism, and depression who presents with acute headache, sore throat, fever, and nausea. Onset over past day. Sick contact of mother with similar symptoms. Chronic recurrent headaches are typically mild in nature. Had sudden onset HA this AM after awaking. Worse HA of life. No neck stiffness. No vision changes or numbness/weakness. Nausea with dry heaving.   Past Medical History:  Diagnosis Date  . Arthritis    knees  . Carpal tunnel syndrome of right wrist 06/2013  . Dental crowns present   . Depression   . GERD (gastroesophageal reflux disease)   . History of hyperthyroidism   . Hypertension    under control with med., has been on med. x 9 yr.  . Thyroid disease   . Tobacco abuse     Patient Active Problem List   Diagnosis Date Noted  . Acute bronchitis 02/18/2016  . Weakness 02/18/2016  . Chest pain 02/24/2015  . Depression 02/24/2015  . Hypothyroidism 02/24/2015  . GERD (gastroesophageal reflux disease)   . Hypertensive urgency   . Arthritis   . Tobacco abuse   . Pain in the chest   . Essential hypertension   . GOITER, MULTINODULAR 09/05/2007  . HYPERTHYROIDISM 09/05/2007    Past Surgical History:  Procedure Laterality Date  . APPENDECTOMY    . CARPAL TUNNEL RELEASE Right 07/04/2013   Procedure: RIGHT CARPAL TUNNEL RELEASE;  Surgeon: Wynonia Sours, MD;  Location: [facility];  Service: Orthopedics;  Laterality: Right;  . HERNIA REPAIR    . INGUINAL HERNIA REPAIR    . KNEE ARTHROSCOPY Right     OB History    No data available         Home Medications    Prior to Admission medications   Medication Sig Start Date End Date Taking? Authorizing Provider  acetaminophen (TYLENOL) 500 MG tablet Take 1,000 mg by mouth every 6 (six) hours as needed for mild pain.   Yes [provider]  amLODipine (NORVASC) 10 MG tablet Take 1 tablet (10 mg total) by mouth daily. 02/25/15  Yes Debbe Odea, MD  aspirin EC 81 MG tablet Take 1 tablet (81 mg total) by mouth daily. Patient taking differently: Take 81 mg by mouth daily as needed (for "heart health").  02/25/15  Yes Debbe Odea, MD  atorvastatin (LIPITOR) 40 MG tablet Take 1 tablet (40 mg total) by mouth daily at 6 PM. Patient taking differently: Take 40 mg by mouth daily.  02/25/15  Yes Debbe Odea, MD  buPROPion (WELLBUTRIN XL) 150 MG 24 hr tablet Take 150 mg by mouth daily.   Yes [provider]  diclofenac (VOLTAREN) 75 MG EC tablet Take 75 mg by mouth 2 (two) times daily with a meal. 10/15/16  Yes [provider]  escitalopram (LEXAPRO) 10 MG tablet Take 20 mg by mouth daily.    Yes [provider]  levothyroxine (SYNTHROID, LEVOTHROID) 150 MCG tablet Take 175 mcg by mouth daily before breakfast.    Yes [provider]  losartan-hydrochlorothiazide Konrad Penta)  100-25 MG per tablet Take 1 tablet by mouth daily.   Yes [provider]  Magnesium 250 MG TABS Take 250 mg by mouth at bedtime.   Yes [provider]  nitroGLYCERIN (NITROSTAT) 0.4 MG SL tablet Place 1 tablet (0.4 mg total) under the tongue every 5 (five) minutes as needed for chest pain. 02/25/15  Yes Debbe Odea, MD  omeprazole (PRILOSEC) 20 MG capsule Take 20 mg by mouth daily before lunch.    Yes [provider]    Family History Family History  Problem Relation Age of Onset  . Hypertension Mother   . Hypertension Father   . Kidney disease Father   . Diverticulosis Father   . Diabetes Sister   . Diabetes Brother     Social History Social  History  Substance Use Topics  . Smoking status: Current Every Day Smoker    Packs/day: 0.50    Years: 30.00    Types: Cigarettes  . Smokeless tobacco: Never Used     Comment: 7 cig./day  . Alcohol use No     Comment: infrequently     Allergies   Hydrocodone-homatropine   Review of Systems Review of Systems  Constitutional: Positive for chills and fever.  HENT: Negative for ear pain and sore throat.   Eyes: Negative for pain and visual disturbance.  Respiratory: Negative for cough and shortness of breath.   Cardiovascular: Negative for chest pain and palpitations.  Gastrointestinal: Positive for nausea. Negative for abdominal pain and vomiting.  Genitourinary: Negative for dysuria and hematuria.  Musculoskeletal: Negative for arthralgias and back pain.  Skin: Negative for color change and rash.  Neurological: Positive for headaches. Negative for seizures and syncope.  All other systems reviewed and are negative.    Physical Exam Updated Vital Signs BP (!) 189/99 (BP Location: Right Arm)   Pulse 98   Temp 99.6 F (37.6 C) (Oral)   Resp 18   Ht 5\' 4"  (1.626 m)   Wt 104.3 kg (230 lb)   SpO2 97%   BMI 39.48 kg/m   Physical Exam  Constitutional: She is oriented to person, place, and time. She appears well-developed. She appears ill.  HENT:  Head: Normocephalic and atraumatic.  Eyes: Conjunctivae are normal.  Neck: Neck supple.  Cardiovascular: Normal rate and regular rhythm.   No murmur heard. Pulmonary/Chest: Effort normal and breath sounds normal. No respiratory distress.  Abdominal: Soft. There is no tenderness.  Musculoskeletal: She exhibits no edema.  Neurological: She is alert and oriented to person, place, and time. No cranial nerve deficit. Coordination normal.  5/5 motor strength and intact sensation in all extremities. Finger-to-nose intact bilaterally  Skin: Skin is warm and dry.  Nursing note and vitals reviewed.    ED Treatments / Results   Labs (all labs ordered are listed, but only abnormal results are displayed) Labs Reviewed  COMPREHENSIVE METABOLIC PANEL - Abnormal; Notable for the following:       Result Value   Chloride 100 (*)    Calcium 8.7 (*)    All other components within normal limits  RAPID STREP SCREEN (NOT AT [facility])  CULTURE, GROUP A STREP ([place])  LIPASE, BLOOD  CBC  URINALYSIS, ROUTINE W REFLEX MICROSCOPIC  INFLUENZA PANEL BY PCR (TYPE A & B)    EKG  EKG Interpretation None       Radiology Ct Angio Head W Or Wo Contrast  Result Date: 11/16/2016 CLINICAL DATA:  Worst headache of life.  History of hypertension.  EXAM: CT ANGIOGRAPHY HEAD TECHNIQUE: Multidetector CT imaging of the head was performed using the standard protocol during bolus administration of intravenous contrast. Multiplanar CT image reconstructions and MIPs were obtained to evaluate the vascular anatomy. CONTRAST:  50 cc Isovue 370 COMPARISON:  CT HEAD September 01, 2014 FINDINGS: CT HEAD BRAIN: No intraparenchymal hemorrhage, mass effect nor midline shift. The ventricles and sulci are normal. No acute large vascular territory infarcts. No abnormal extra-axial fluid collections. Basal cisterns are patent. VASCULAR: Trace calcific atherosclerosis carotid siphons. SKULL/SOFT TISSUES: No skull fracture. No significant soft tissue swelling. ORBITS/SINUSES: Exophthalmos with enlarged inferior medial to lesser extent superior and lateral rectus muscles. Trace LEFT frontal mucosal thickening. OTHER: None. CTA HEAD ANTERIOR CIRCULATION: Patent cervical internal carotid arteries, petrous, cavernous and supra clinoid internal carotid arteries. Widely patent anterior communicating artery. Patent anterior and middle cerebral arteries. No large vessel occlusion, significant stenosis, contrast extravasation or aneurysm. POSTERIOR CIRCULATION: LEFT vertebral artery is dominant. Patent vertebral arteries, vertebrobasilar junction and basilar artery, as well as main  branch vessels. Patent posterior cerebral arteries. Bilateral posterior communicating arteries present. No large vessel occlusion, significant stenosis, contrast extravasation or aneurysm. VENOUS SINUSES: Major dural venous sinuses are patent though not tailored for evaluation on this angiographic examination. ANATOMIC VARIANTS: None. DELAYED PHASE: No abnormal intracranial enhancement. MIP images reviewed. IMPRESSION: CT HEAD: 1. Negative CT HEAD with and without contrast. 2. CT findings of grave's ophthalmopathy. Recommend nonemergent ophthalmological consultation and correlation with thyroid function tests. CTA HEAD: 1. Negative CT angiogram of the head. Electronically Signed   By: Elon Alas M.D.   On: 11/16/2016 20:11    Procedures Procedures (including critical care time)  Medications Ordered in ED Medications  acetaminophen (TYLENOL) tablet 650 mg (not administered)  ondansetron (ZOFRAN-ODT) disintegrating tablet 4 mg (4 mg Oral Given 11/16/16 1522)  sodium chloride 0.9 % bolus 1,000 mL (0 mLs Intravenous Stopped 11/16/16 2317)  metoCLOPramide (REGLAN) injection 10 mg (10 mg Intravenous Given 11/16/16 1858)  diphenhydrAMINE (BENADRYL) injection 25 mg (25 mg Intravenous Given 11/16/16 1904)  iopamidol (ISOVUE-370) 76 % injection (50 mLs  Contrast Given 11/16/16 1917)     Initial Impression / Assessment and Plan / ED Course  I have reviewed the triage vital signs and the nursing notes.  Pertinent labs & imaging results that were available during my care of the patient were reviewed by me and considered in my medical decision making (see chart for details).     56 yo F who p/w severe sudden onset HA with chills, fever, and sore throat. T100.4 on arrival. No neck stiffness on exam. Lungs CTAB. Nonfocal neuro exam.  HA resolved with reglan/benadryl. CTA H&N showing NAICA.   Shared decision making with patient regarding additional workup with LP. Explained risks of permanent disability  or death for missed meningitis or SAH. Patient verbalized understanding of risks. She elects to continue supportive management and will return to ED for worsening sx.  Return precautions provided for worsening symptoms. Pt will f/u with PCP at first availability. Pt verbalized agreement with plan.  Pt care d/w Dr. Oleta Mouse  Final Clinical Impressions(s) / ED Diagnoses   Final diagnoses:  Acute nonintractable headache, unspecified headache type  Viral illness    New Prescriptions Discharge Medication List as of 11/16/2016 10:56 PM       Ruhi Kopke, Pilar Plate, MD 11/17/16 0938    Forde Dandy, MD 11/19/16 [phone]

## 2016-11-16 NOTE — ED Triage Notes (Signed)
Pt. Reports having a headache beginning last week.  She went to her Md last week, but the headache is getting worse.  She reports that it is generalized pressure all over with sharp pain. She is having nausea and vomiting and woke up this am with a sore throat.,  She has  A hx of headaches at this time of year , but this is different.  Neuro screen negative and Van negative.  Skin is warm and dry.  alert and oriented X4.Marland Kitchen

## 2016-11-16 NOTE — ED Notes (Signed)
Pt in CT.

## 2016-11-16 NOTE — ED Notes (Signed)
ED Provider at bedside. Oleta Mouse

## 2016-11-16 NOTE — ED Notes (Signed)
Pt taken to CT.

## 2016-11-16 NOTE — Discharge Instructions (Addendum)
Please continue fluid hydration as tolerated. Please take diclofenac and tylenol as needed for headache. Please return to ED for worsening symptoms.

## 2016-11-16 NOTE — ED Provider Notes (Signed)
I saw and evaluated the patient, reviewed the resident's note and I agree with the findings and plan.   EKG Interpretation None      56 year old female who presents with headache, nausea, vomiting, and sore throat. Recently exposed to sick contact with viral illness. Over past few days with mild generalized headache and sore throat. This morning after waking up with sudden onset of severe headache, nausea, dry heaving. No fever, chills, diarrhea, abdominal pain, chest pain, difficulty breathing, cough, congestion, runny nose, neck pain or stiffness, vision or speech changes, focal numbness or weakness, difficulty walking or confusion.   In ED, with fever of 100.48F. Well appearing and in no acute distress. She has no nuchal rigidity. She has normal neurological exam. Given sudden onset severe headache, CT angio performed. This is visualized and shows no acute intracranial process. Blood work reassuring. Strep and influenza negative.  SAH less likely with fever, but out of 6 hour window. Considered meningitis as well given fever, severe headache, n/v and no other source of infection. Discussed lumbar puncture with patient. Discussed risk benefits. However, she declines at this time, and wants to supportively manage her symptoms first. She will return for any progressive or worsening symptoms. Strict return and follow-up instructions reviewed. She/He expressed understanding of all discharge instructions and felt comfortable with the plan of care.    Forde Dandy, MD 11/16/16 [phone]

## 2016-11-19 LAB — CULTURE, GROUP A STREP (THRC)

## 2017-01-05 DIAGNOSIS — R05 Cough: Secondary | ICD-10-CM | POA: Diagnosis not present

## 2017-01-05 DIAGNOSIS — J069 Acute upper respiratory infection, unspecified: Secondary | ICD-10-CM | POA: Diagnosis not present

## 2017-03-18 DIAGNOSIS — J0100 Acute maxillary sinusitis, unspecified: Secondary | ICD-10-CM | POA: Diagnosis not present

## 2017-05-18 DIAGNOSIS — E039 Hypothyroidism, unspecified: Secondary | ICD-10-CM | POA: Diagnosis not present

## 2017-05-18 DIAGNOSIS — R002 Palpitations: Secondary | ICD-10-CM | POA: Diagnosis not present

## 2017-05-18 DIAGNOSIS — E782 Mixed hyperlipidemia: Secondary | ICD-10-CM | POA: Diagnosis not present

## 2017-05-19 DIAGNOSIS — R002 Palpitations: Secondary | ICD-10-CM

## 2017-05-19 HISTORY — DX: Palpitations: R00.2

## 2017-05-19 IMAGING — DX DG CHEST 2V
2 series · 2 of 2 positions shown · non-contrast
Comparison: 02/24/2015

CLINICAL DATA: Chronic cough, nausea and fever for 4 days

EXAM:
CHEST  2 VIEW

[chest pa]
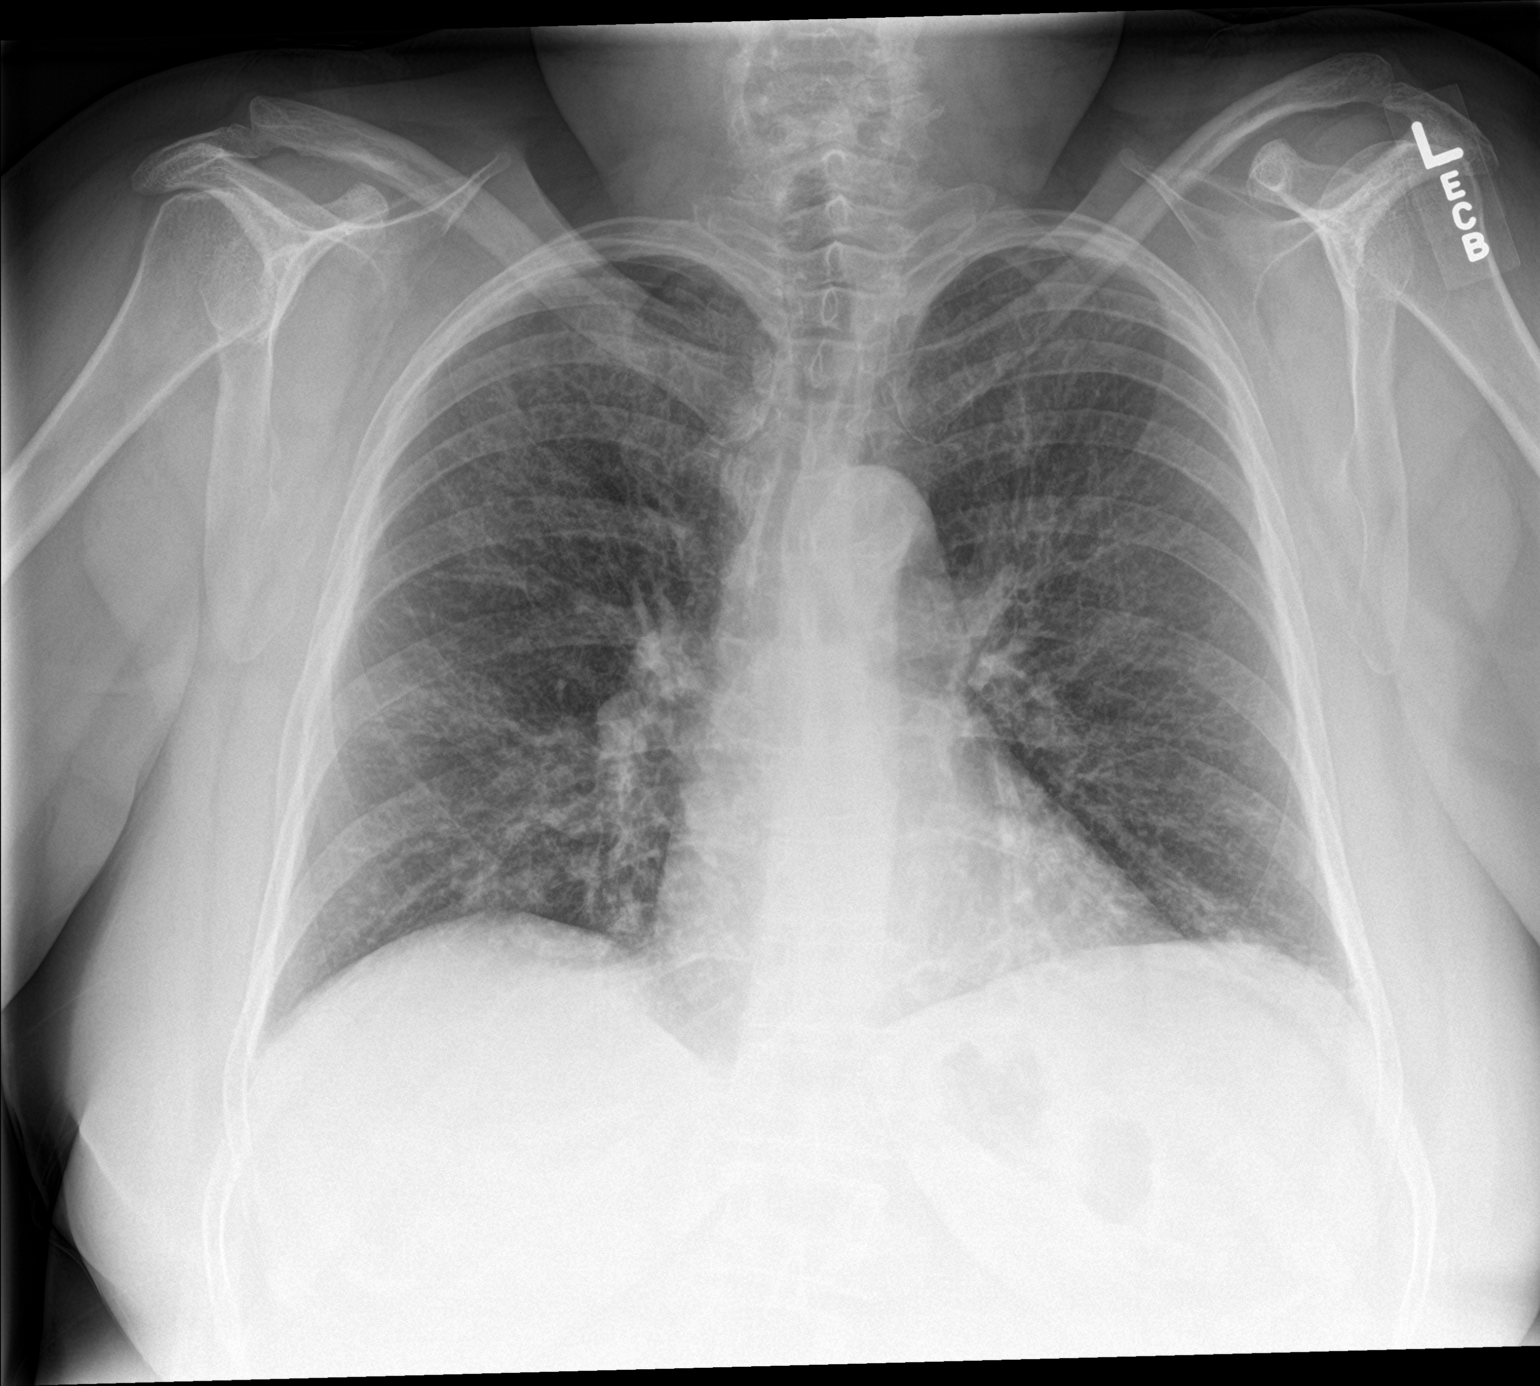

[chest lat]
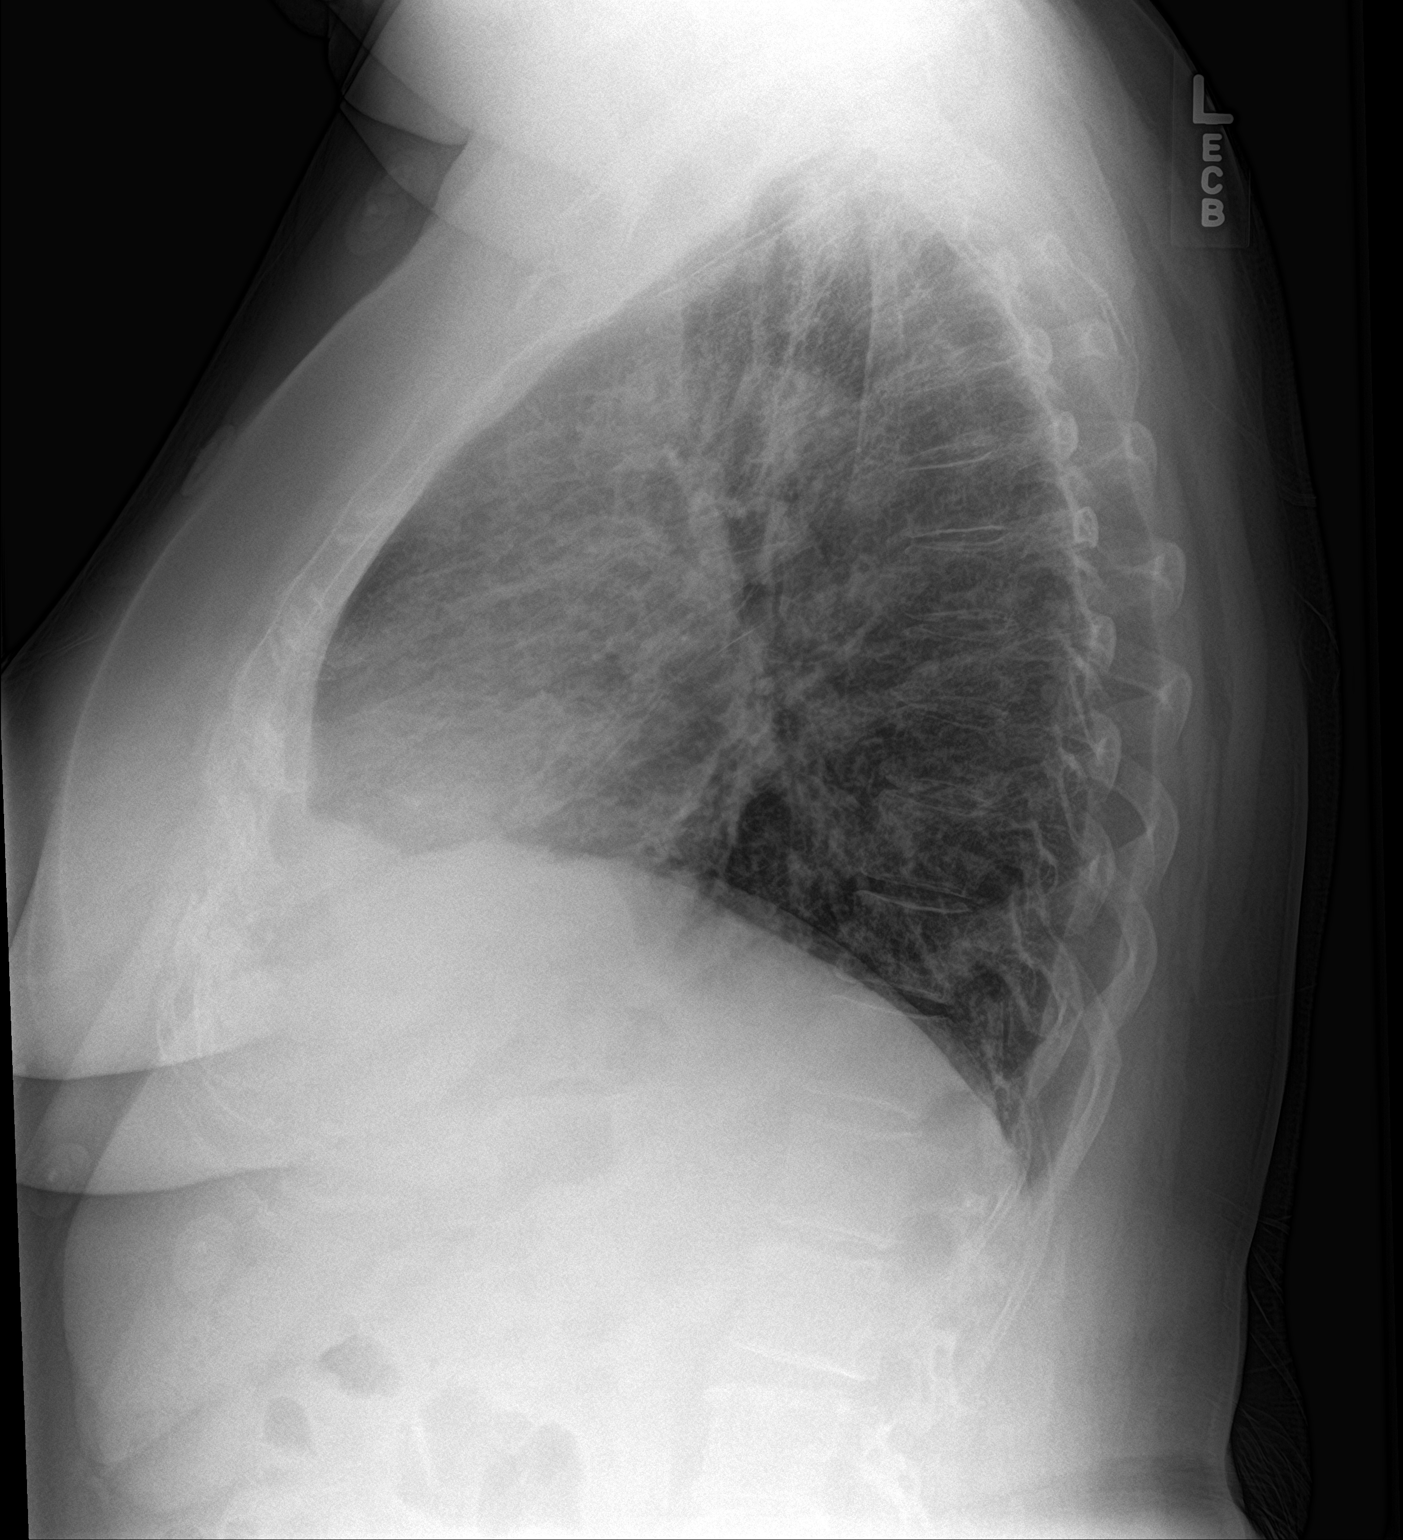

[2 of 2 positions shown; findings below may reference images not displayed]

FINDINGS: The heart and mediastinal contours are stable in appearance and
within normal limits for size. There is aortic atherosclerosis
involving the arch. Diffuse interstitial prominence is noted
consistent with bronchitic change. No pneumonic consolidation is
noted. No effusion nor suspicious osseous abnormality.
IMPRESSION: Bilateral increase in interstitial prominence consistent with mild
bronchitic change.

Aortic atherosclerosis.

## 2017-05-23 NOTE — Progress Notes (Signed)
Cardiology Office Note:    Date:  05/24/2017   ID:  Sheri Villarreal, DOB [birth year], MRN [id number]  PCP:  Leighton Ruff, MD  Cardiologist:  Shirlee More, MD   Referring MD: Leighton Ruff, MD  ASSESSMENT:    1. Palpitations   2. Essential hypertension   3. Acquired hypothyroidism    PLAN:    In order of problems listed above:  1. Her symptoms have all but resolved at this time she prefers not to wear a monitor repeat cardiac testing I gave her information for an adapter for her iPhone that she can use to capture her heart rhythm to come back and see me in the office as needed.  She is given instructions avoid over-the-counter proarrhythmic medications. 2. Elevated today she tells me her blood pressure is usually well controlled at home and in this context, not all alter her current antihypertensive medications 3. Stable managed by her PCP T4 is normal TSH elevators.  Next appointment as needed   Medication Adjustments/Labs and Tests Ordered: Current medicines are reviewed at length with the patient today.  Concerns regarding medicines are outlined above.  No orders of the defined types were placed in this encounter.  No orders of the defined types were placed in this encounter.    Chief Complaint  Patient presents with  . New Patient (Initial Visit)    per Dr Drema Dallas to evaluate palpitations  . Palpitations    History of Present Illness:    Sheri Villarreal is a 57 y.o. female who is being seen today for the evaluation of palpitation at the request of Leighton Ruff, MD. Recently she was having frequent episodes of skipped beat because brief breathlessness lightheadedness and the symptoms of waxed and waned through the day has become very infrequent and none in the last few days it she says she is under increased stress of work but no other obvious precipitant potassium is normal and she takes no over-the-counter proarrhythmic drugs the symptoms have not  cause syncope or sustained rapid heart rate.  Reviewed the options of an ambulatory heart rate monitor she prefers to watch her symptoms and if they become significant he has an iPhone adapter to capture and contact me for office follow-up.  2011 she had a stress echo performed that was normal I do not see a reason to repeat either an echo or stress modality at this time.  Her T4 is normal I would continue her current thyroid supplement as excess replacement can at times exacerbated arrhythmia.  I do not think she requires any suppressant antiarrhythmic treatment.   Past Medical History:  Diagnosis Date  . Acute bronchitis 02/18/2016  . Arthritis    knees  . Carpal tunnel syndrome of right wrist 06/2013  . Chest pain 02/24/2015  . Dental crowns present   . Depression   . Essential hypertension   . GERD (gastroesophageal reflux disease)   . GOITER, MULTINODULAR 09/05/2007   Qualifier: Diagnosis of  By: Loanne Drilling MD, Jacelyn Pi   . History of hyperthyroidism   . Hypertension    under control with med., has been on med. x 9 yr.  . Hypertensive urgency    under control with med., has been on med. x 9 yr.   . HYPERTHYROIDISM 09/05/2007   Qualifier: Diagnosis of  By: Loanne Drilling MD, Jacelyn Pi   . Hypothyroidism 02/24/2015  . Pain in the chest   . Palpitations 05/19/2017  . Thyroid disease   . Tobacco abuse   .  Weakness 02/18/2016    Past Surgical History:  Procedure Laterality Date  . APPENDECTOMY    . CARPAL TUNNEL RELEASE Right 07/04/2013   Procedure: RIGHT CARPAL TUNNEL RELEASE;  Surgeon: Wynonia Sours, MD;  Location: [facility];  Service: Orthopedics;  Laterality: Right;  . HERNIA REPAIR    . INGUINAL HERNIA REPAIR    . KNEE ARTHROSCOPY Right     Current Medications: Current Meds  Medication Sig  . acetaminophen (TYLENOL) 500 MG tablet Take 1,000 mg by mouth as needed for mild pain.   Marland Kitchen amLODipine (NORVASC) 10 MG tablet Take 1 tablet (10 mg total) by mouth daily.  Marland Kitchen aspirin EC 81  MG tablet Take 1 tablet (81 mg total) by mouth daily. (Patient taking differently: Take 81 mg by mouth daily as needed (for "heart health"). )  . atorvastatin (LIPITOR) 40 MG tablet Take 1 tablet (40 mg total) by mouth daily at 6 PM. (Patient taking differently: Take 40 mg by mouth daily. )  . buPROPion (WELLBUTRIN XL) 150 MG 24 hr tablet Take 150 mg by mouth daily.  . diclofenac (VOLTAREN) 75 MG EC tablet Take 75 mg by mouth 2 (two) times daily with a meal.  . escitalopram (LEXAPRO) 10 MG tablet Take 20 mg by mouth daily.   Marland Kitchen levothyroxine (SYNTHROID, LEVOTHROID) 150 MCG tablet Take 175 mcg by mouth daily before breakfast.   . losartan-hydrochlorothiazide (HYZAAR) 100-25 MG per tablet Take 1 tablet by mouth daily.  . Magnesium 500 MG TABS Take 500 mg by mouth at bedtime.   . nitroGLYCERIN (NITROSTAT) 0.4 MG SL tablet Place 1 tablet (0.4 mg total) under the tongue every 5 (five) minutes as needed for chest pain.  Marland Kitchen omeprazole (PRILOSEC) 20 MG capsule Take 20 mg by mouth daily before lunch.      Allergies:   Hydrocodone-homatropine   Social History   Socioeconomic History  . Marital status: Single    Spouse name: Not on file  . Number of children: Not on file  . Years of education: Not on file  . Highest education level: Not on file  Occupational History  . Not on file  Social Needs  . Financial resource strain: Not on file  . Food insecurity:    Worry: Not on file    Inability: Not on file  . Transportation needs:    Medical: Not on file    Non-medical: Not on file  Tobacco Use  . Smoking status: Current Every Day Smoker    Packs/day: 0.50    Years: 30.00    Pack years: 15.00    Types: Cigarettes  . Smokeless tobacco: Never Used  . Tobacco comment: 7 cig./day  Substance and Sexual Activity  . Alcohol use: Yes    Comment: infrequently/rare  . Drug use: No  . Sexual activity: Not on file  Lifestyle  . Physical activity:    Days per week: Not on file    Minutes per  session: Not on file  . Stress: Not on file  Relationships  . Social connections:    Talks on phone: Not on file    Gets together: Not on file    Attends religious service: Not on file    Active member of club or organization: Not on file    Attends meetings of clubs or organizations: Not on file    Relationship status: Not on file  Other Topics Concern  . Not on file  Social History Narrative  . Not  on file     Family History: The patient's family history includes Diabetes in her brother and sister; Diverticulosis in her father; Hypertension in her father and mother; Kidney disease in her father.  ROS:   Review of Systems  Constitution: Negative.  HENT: Negative.   Eyes: Negative.   Cardiovascular: Positive for palpitations.  Respiratory: Positive for shortness of breath.   Endocrine: Negative.   Hematologic/Lymphatic: Negative.   Skin: Negative.   Musculoskeletal: Negative.   Gastrointestinal: Negative.   Genitourinary: Negative.   Neurological: Negative.   Psychiatric/Behavioral: Negative.   Allergic/Immunologic: Negative.    Please see the history of present illness.     All other systems reviewed and are negative.  EKGs/Labs/Other Studies Reviewed:    The following studies were reviewed today:  EKG 05/18/17 [place] normal EKG  Recent Labs:  05/12/17: CBC and CMPnormal, K 4.1, TSH 15.4 and FT4 1.05 11/16/2016: ALT 16; BUN 8; Creatinine, Ser 0.74; Hemoglobin 15.0; Platelets 311; Potassium 3.6; Sodium 135  Recent Lipid Panel  Chol 278, HDL 52, LDL 190    Component Value Date/Time   CHOL 197 02/25/2015 0220   TRIG 204 (H) 02/25/2015 0220   HDL 36 (L) 02/25/2015 0220   CHOLHDL 5.5 02/25/2015 0220   VLDL 41 (H) 02/25/2015 0220   LDLCALC 120 (H) 02/25/2015 0220    Physical Exam:    VS:  BP (!) 182/108 (BP Location: Left Arm, Patient Position: Sitting, Cuff Size: Large)   Pulse 78   Ht 5\' 4"  (1.626 m)   Wt 236 lb 12.8 oz (107.4 kg)   SpO2 98%   BMI 40.65 kg/m      Wt Readings from Last 3 Encounters:  05/24/17 236 lb 12.8 oz (107.4 kg)  11/16/16 230 lb (104.3 kg)  02/18/16 231 lb 14.4 oz (105.2 kg)     GEN:  Well nourished, well developed in no acute distress HEENT: Normal NECK: No JVD; No carotid bruits LYMPHATICS: No lymphadenopathy CARDIAC: RRR, no murmurs, rubs, gallops RESPIRATORY:  Clear to auscultation without rales, wheezing or rhonchi  ABDOMEN: Soft, non-tender, non-distended MUSCULOSKELETAL:  No edema; No deformity  SKIN: Warm and dry NEUROLOGIC:  Alert and oriented x 3 PSYCHIATRIC:  Normal affect     Signed, Shirlee More, MD  05/24/2017 10:52 AM    [place]

## 2017-05-24 ENCOUNTER — Encounter: Payer: Self-pay | Admitting: Cardiology

## 2017-05-24 ENCOUNTER — Ambulatory Visit: Payer: 59 | Admitting: Cardiology

## 2017-05-24 VITALS — BP 162/90 | HR 78 | Ht 64.0 in | Wt 236.8 lb

## 2017-05-24 DIAGNOSIS — I10 Essential (primary) hypertension: Secondary | ICD-10-CM | POA: Diagnosis not present

## 2017-05-24 DIAGNOSIS — E039 Hypothyroidism, unspecified: Secondary | ICD-10-CM | POA: Diagnosis not present

## 2017-05-24 DIAGNOSIS — R002 Palpitations: Secondary | ICD-10-CM

## 2017-05-24 NOTE — Patient Instructions (Addendum)
Medication Instructions:  Your physician recommends that you continue on your current medications as directed. Please refer to the Current Medication list given to you today.  Labwork: None  Testing/Procedures: None  Follow-Up: Your physician recommends that you schedule a follow-up appointment as needed if symptoms worsen or fail to improve.  Any Other Special Instructions Will Be Listed Below (If Applicable).     If you need a refill on your cardiac medications before your next appointment, please call your pharmacy.    1. Avoid all over-the-counter antihistamines except Claritin/Loratadine and Zyrtec/Cetrizine. 2. Avoid all combination including cold sinus allergies flu decongestant and sleep medications 3. You can use Robitussin DM Mucinex and Mucinex DM for cough. 4. can use Tylenol aspirin ibuprofen and naproxen but no combinations such as sleep or sinus.  KardiaMobile [email or web address]        FDA-cleared, clinical grade mobile EKG monitor: Sheri Villarreal is the most clinically-validated mobile EKG used by the world's leading cardiac care medical professionals With Basic service, know instantly if your heart rhythm is normal or if atrial fibrillation is detected, and email the last single EKG recording to yourself or your doctor Premium service, available for purchase through the [employer] app for $9.99 per month or $99 per year, includes unlimited history and storage of your EKG recordings, a monthly EKG summary report to share with your doctor, along with the ability to track your blood pressure, activity and weight Includes one KardiaMobile phone clip FREE SHIPPING: Standard delivery 1-3 business days. Orders placed by 11:00am PST will ship that afternoon. Otherwise, will ship next business day. All orders ship via [employer] from [place], [place]

## 2017-07-02 ENCOUNTER — Ambulatory Visit (HOSPITAL_COMMUNITY)
Admission: EM | Admit: 2017-07-02 | Discharge: 2017-07-02 | Disposition: A | Discharge status: Home / Self Care | Payer: 59 | Attending: Family Medicine | Admitting: Family Medicine

## 2017-07-02 ENCOUNTER — Encounter (HOSPITAL_COMMUNITY): Payer: Self-pay | Admitting: Emergency Medicine

## 2017-07-02 DIAGNOSIS — I10 Essential (primary) hypertension: Secondary | ICD-10-CM

## 2017-07-02 DIAGNOSIS — H65192 Other acute nonsuppurative otitis media, left ear: Secondary | ICD-10-CM

## 2017-07-02 DIAGNOSIS — J302 Other seasonal allergic rhinitis: Secondary | ICD-10-CM

## 2017-07-02 MED ORDER — CETIRIZINE HCL 10 MG PO CHEW: 10.0000 mg | CHEWABLE_TABLET | Freq: Every day | ORAL | 0 refills | Status: DC

## 2017-07-02 MED ORDER — AMOXICILLIN-POT CLAVULANATE 875-125 MG PO TABS: 1.0000 | ORAL_TABLET | Freq: Two times a day (BID) | ORAL | 0 refills | Status: AC

## 2017-07-02 NOTE — ED Provider Notes (Signed)
[facility]   [id number] 07/02/17 Arrival Time: 8527  SUBJECTIVE: History from: patient.  Sheri Villarreal is a 57 y.o. female who presents with gradual onset of ear "fullness" that began a few days.  Denies sick exposure or precipitating event, such as swimming or wearing earplugs.  Has tried nasal spray, homeopathic drops without relief.  Denies aggravating symptoms.  Reports previous symptoms in the past that improved with homeopathic medication and nasal spray.  She complains of sneezing, and cough. Denies fever, chills, fatigue, sinus pain, sore throat, SOB, wheezing, chest pain, nausea, changes in bowel or bladder habits.    Received flu shot this year: No  ROS: As per HPI. Social History   Tobacco Use  Smoking Status Current Every Day Smoker  . Packs/day: 0.50  . Years: 30.00  . Pack years: 15.00  . Types: Cigarettes  Smokeless Tobacco Never Used  Tobacco Comment   7 cig./day   Past Medical History:  Diagnosis Date  . Acute bronchitis 02/18/2016  . Arthritis    knees  . Carpal tunnel syndrome of right wrist 06/2013  . Chest pain 02/24/2015  . Dental crowns present   . Depression   . Essential hypertension   . GERD (gastroesophageal reflux disease)   . GOITER, MULTINODULAR 09/05/2007   Qualifier: Diagnosis of  By: Loanne Drilling MD, Jacelyn Pi   . History of hyperthyroidism   . Hypertension    under control with med., has been on med. x 9 yr.  . Hypertensive urgency    under control with med., has been on med. x 9 yr.   . HYPERTHYROIDISM 09/05/2007   Qualifier: Diagnosis of  By: Loanne Drilling MD, Jacelyn Pi   . Hypothyroidism 02/24/2015  . Pain in the chest   . Palpitations 05/19/2017  . Thyroid disease   . Tobacco abuse   . Weakness 02/18/2016   Past Surgical History:  Procedure Laterality Date  . APPENDECTOMY    . CARPAL TUNNEL RELEASE Right 07/04/2013   Procedure: RIGHT CARPAL TUNNEL RELEASE;  Surgeon: Wynonia Sours, MD;  Location: [facility];  Service:  Orthopedics;  Laterality: Right;  . HERNIA REPAIR    . INGUINAL HERNIA REPAIR    . KNEE ARTHROSCOPY Right    Allergies  Allergen Reactions  . Hydrocodone-Homatropine Itching    Reaction to Hycodan cough syrup (pt has taken norco in the past with no reaction)   Outpatient Medications   acetaminophen (TYLENOL) 500 MG tablet    amLODipine (NORVASC) 10 MG tablet    amoxicillin-clavulanate (AUGMENTIN) 875-125 MG tablet    aspirin EC 81 MG tablet    atorvastatin (LIPITOR) 40 MG tablet    buPROPion (WELLBUTRIN XL) 150 MG 24 hr tablet    cetirizine (ZYRTEC) 10 MG chewable tablet    diclofenac (VOLTAREN) 75 MG EC tablet    escitalopram (LEXAPRO) 10 MG tablet    levothyroxine (SYNTHROID, LEVOTHROID) 150 MCG tablet    losartan-hydrochlorothiazide (HYZAAR) 100-25 MG per tablet    Magnesium 500 MG TABS    nitroGLYCERIN (NITROSTAT) 0.4 MG SL tablet    omeprazole (PRILOSEC) 20 MG capsule     Social History   Socioeconomic History  . Marital status: Single    Spouse name: Not on file  . Number of children: Not on file  . Years of education: Not on file  . Highest education level: Not on file  Occupational History  . Not on file  Social Needs  . Financial resource strain: Not  on file  . Food insecurity:    Worry: Not on file    Inability: Not on file  . Transportation needs:    Medical: Not on file    Non-medical: Not on file  Tobacco Use  . Smoking status: Current Every Day Smoker    Packs/day: 0.50    Years: 30.00    Pack years: 15.00    Types: Cigarettes  . Smokeless tobacco: Never Used  . Tobacco comment: 7 cig./day  Substance and Sexual Activity  . Alcohol use: Yes    Comment: infrequently/rare  . Drug use: No  . Sexual activity: Not on file  Lifestyle  . Physical activity:    Days per week: Not on file    Minutes per session: Not on file  . Stress: Not on file  Relationships  . Social connections:    Talks on phone: Not on file    Gets together: Not on file     Attends religious service: Not on file    Active member of club or organization: Not on file    Attends meetings of clubs or organizations: Not on file    Relationship status: Not on file  . Intimate partner violence:    Fear of current or ex partner: Not on file    Emotionally abused: Not on file    Physically abused: Not on file    Forced sexual activity: Not on file  Other Topics Concern  . Not on file  Social History Narrative  . Not on file   Family History  Problem Relation Age of Onset  . Hypertension Mother   . Hypertension Father   . Kidney disease Father   . Diverticulosis Father   . Diabetes Sister   . Diabetes Brother     OBJECTIVE:  Vitals:   07/02/17 1129 07/02/17 1130 07/02/17 1134 07/02/17 1227  BP:   (!) 199/109 (!) 191/93  Pulse:  82  75  Resp:  16  16  Temp:  98.9 F (37.2 C)    TempSrc:  Oral    SpO2:  98%  99%  Weight: 230 lb (104.3 kg)        General appearance: alert; appears fatigued HEENT: Ears: EACs clear, RT TM mildly injected with cone of light visible, LT TM tense and injected with cloudy fluid; Eyes: PERRL, EOMI grossly; Sinuses nontender to palpation; Nose: no rhinorrhea; Throat: oropharynx nonerythematous, tonsils nonerythematous, uvula midline Neck: supple without LAD Lungs: unlabored respirations, CTA bilaterally without adventitious breath sounds Heart: regular rate and rhythm.  Radial pulses 2+ symmetrical bilaterally Skin: warm and dry Psychological: alert and cooperative; normal mood and affect  Imaging: No results found.   ASSESSMENT & PLAN:  1. Other acute nonsuppurative otitis media of left ear, recurrence not specified   2. Essential hypertension   3. Seasonal allergies     Meds ordered this encounter  Medications  . amoxicillin-clavulanate (AUGMENTIN) 875-125 MG tablet    Sig: Take 1 tablet by mouth every 12 (twelve) hours for 10 days.    Dispense:  20 tablet    Refill:  0    Order Specific Question:    Supervising Provider    Answer:   Wynona Luna [phone]  . cetirizine (ZYRTEC) 10 MG chewable tablet    Sig: Chew 1 tablet (10 mg total) by mouth daily.    Dispense:  20 tablet    Refill:  0    Order Specific Question:   Supervising Provider  Answer:   Wynona Luna [[id number]]    Rest and drink plenty of fluids Prescribed augmentin 875 mg twice a day for 10 days Prescribed zyrtec take daily Take medications as directed and to completion Continue to use OTC tylenol as needed for pain control Follow up with PCP if symptoms persists Return here or go to the ER if you have any new or worsening symptoms   Blood pressure elevated in office.  Please recheck in 24 hours.  If it continues to be greater than 140/90 please follow up with PCP for further evaluation and management.   Reviewed expectations re: course of current medical issues. Questions answered. Outlined signs and symptoms indicating need for more acute intervention. Patient verbalized understanding. After Visit Summary given.         Lestine Box, PA-C 07/02/17 1234

## 2017-07-02 NOTE — Discharge Instructions (Addendum)
Rest and drink plenty of fluids Prescribed augmentin 875 mg twice a day for 10 days Prescribed zyrtec take daily Take medications as directed and to completion Continue to use OTC tylenol as needed for pain control Follow up with PCP if symptoms persists Return here or go to the ER if you have any new or worsening symptoms   Blood pressure elevated in office.  Please recheck in 24 hours.  If it continues to be greater than 140/90 please follow up with PCP for further evaluation and management.

## 2017-07-02 NOTE — ED Triage Notes (Signed)
PT reports left ear fullness and decreased hearing. PT reports congestion, cough, and drainage as well.

## 2017-07-06 DIAGNOSIS — H6590 Unspecified nonsuppurative otitis media, unspecified ear: Secondary | ICD-10-CM | POA: Diagnosis not present

## 2017-07-14 DIAGNOSIS — H906 Mixed conductive and sensorineural hearing loss, bilateral: Secondary | ICD-10-CM | POA: Diagnosis not present

## 2017-07-14 DIAGNOSIS — E049 Nontoxic goiter, unspecified: Secondary | ICD-10-CM | POA: Diagnosis not present

## 2017-07-14 DIAGNOSIS — H6983 Other specified disorders of Eustachian tube, bilateral: Secondary | ICD-10-CM | POA: Diagnosis not present

## 2017-07-29 DIAGNOSIS — H6983 Other specified disorders of Eustachian tube, bilateral: Secondary | ICD-10-CM | POA: Diagnosis not present

## 2017-07-29 DIAGNOSIS — H6523 Chronic serous otitis media, bilateral: Secondary | ICD-10-CM | POA: Diagnosis not present

## 2017-07-29 DIAGNOSIS — H906 Mixed conductive and sensorineural hearing loss, bilateral: Secondary | ICD-10-CM | POA: Diagnosis not present

## 2017-09-11 ENCOUNTER — Encounter (HOSPITAL_COMMUNITY): Payer: Self-pay

## 2017-09-11 ENCOUNTER — Ambulatory Visit (HOSPITAL_COMMUNITY)
Admission: EM | Admit: 2017-09-11 | Discharge: 2017-09-11 | Disposition: A | Discharge status: Home / Self Care | Payer: 59 | Attending: Family Medicine | Admitting: Family Medicine

## 2017-09-11 ENCOUNTER — Other Ambulatory Visit: Payer: Self-pay

## 2017-09-11 DIAGNOSIS — R197 Diarrhea, unspecified: Secondary | ICD-10-CM

## 2017-09-11 DIAGNOSIS — M5489 Other dorsalgia: Secondary | ICD-10-CM

## 2017-09-11 LAB — POCT URINALYSIS DIP (DEVICE)
Bilirubin Urine: NEGATIVE
GLUCOSE, UA: NEGATIVE mg/dL
HGB URINE DIPSTICK: NEGATIVE
Ketones, ur: NEGATIVE mg/dL
Leukocytes, UA: NEGATIVE
NITRITE: NEGATIVE
PROTEIN: NEGATIVE mg/dL
SPECIFIC GRAVITY, URINE: 1.02 (ref 1.005–1.030)
UROBILINOGEN UA: 0.2 mg/dL (ref 0.0–1.0)
pH: 7 (ref 5.0–8.0)

## 2017-09-11 MED ORDER — PREDNISONE 50 MG PO TABS: 50.0000 mg | ORAL_TABLET | Freq: Every day | ORAL | 0 refills | Status: AC

## 2017-09-11 MED ORDER — AZITHROMYCIN 500 MG PO TABS: 500.0000 mg | ORAL_TABLET | Freq: Every day | ORAL | 0 refills | Status: AC

## 2017-09-11 NOTE — Discharge Instructions (Signed)
As discussed, will treat for traveler's diarrhea for now given 2-week history of diarrhea.  Start azithromycin as directed.  Push fluids.  Bland diet, advance as tolerated. Monitor for any worsening of symptoms, nausea or vomiting not controlled by medication, worsening abdominal pain, fever, follow-up for reevaluation.  Continue Robaxin for now for back pain.  I have printed out a prescription of prednisone, you can start taking if symptoms has not improved.  Monitor for numbness and tingling of the inner thighs, loss of bladder or bowel control, go to the emergency department for further evaluation.

## 2017-09-11 NOTE — ED Provider Notes (Signed)
[place]    CSN: [id number] Arrival date & time: 09/11/17  1129     History   Chief Complaint Chief Complaint  Patient presents with  . Back Pain  . Diarrhea    HPI Sheri Villarreal is a 57 y.o. female.   57 year old female comes in for 2-week history of diarrhea, 1 day history of back pain.  Patient states that she went to the beach with family, and started having diarrhea while on the trip.  Had very frequent and watery stools at the time with some abdominal cramping.  States had a few days of improving symptoms, stools became more formed and less frequent, but continued.  States for the past 2 days, frequency of stool seems to have increased again.  States has already had 6 episodes of diarrhea today.  Denies melena, hematochezia.  No obvious abdominal cramping now.  Denies nausea, vomiting.  Denies fever, chills, night sweats.  Patient with recent antibiotic use, Augmentin back in May.  States 1 day history of back pain, denies any injury/trauma.  Has had this in the past, took Robaxin with some relief.  States pain much improved with laying down.  Worsens with movement, especially going from a sitting to standing position.  Denies numbness, tingling, loss of bladder or bowel control.  Denies radiation of pain.     Past Medical History:  Diagnosis Date  . Acute bronchitis 02/18/2016  . Arthritis    knees  . Carpal tunnel syndrome of right wrist 06/2013  . Chest pain 02/24/2015  . Dental crowns present   . Depression   . Essential hypertension   . GERD (gastroesophageal reflux disease)   . GOITER, MULTINODULAR 09/05/2007   Qualifier: Diagnosis of  By: Loanne Drilling MD, Jacelyn Pi   . History of hyperthyroidism   . Hypertension    under control with med., has been on med. x 9 yr.  . Hypertensive urgency    under control with med., has been on med. x 9 yr.   . HYPERTHYROIDISM 09/05/2007   Qualifier: Diagnosis of  By: Loanne Drilling MD, Jacelyn Pi   . Hypothyroidism 02/24/2015  .  Pain in the chest   . Palpitations 05/19/2017  . Thyroid disease   . Tobacco abuse   . Weakness 02/18/2016    Patient Active Problem List   Diagnosis Date Noted  . Palpitations 05/19/2017  . Acute bronchitis 02/18/2016  . Weakness 02/18/2016  . Chest pain 02/24/2015  . Depression 02/24/2015  . Hypothyroidism 02/24/2015  . GERD (gastroesophageal reflux disease)   . Hypertensive urgency   . Arthritis   . Tobacco abuse   . Pain in the chest   . Essential hypertension   . GOITER, MULTINODULAR 09/05/2007  . HYPERTHYROIDISM 09/05/2007    Past Surgical History:  Procedure Laterality Date  . APPENDECTOMY    . CARPAL TUNNEL RELEASE Right 07/04/2013   Procedure: RIGHT CARPAL TUNNEL RELEASE;  Surgeon: Wynonia Sours, MD;  Location: [facility];  Service: Orthopedics;  Laterality: Right;  . HERNIA REPAIR    . INGUINAL HERNIA REPAIR    . KNEE ARTHROSCOPY Right     OB History   None      Home Medications    Prior to Admission medications   Medication Sig Start Date End Date Taking? Authorizing Provider  acetaminophen (TYLENOL) 500 MG tablet Take 1,000 mg by mouth as needed for mild pain.    Yes [provider]  amLODipine (NORVASC) 10 MG  tablet Take 1 tablet (10 mg total) by mouth daily. 02/25/15  Yes Debbe Odea, MD  atorvastatin (LIPITOR) 40 MG tablet Take 1 tablet (40 mg total) by mouth daily at 6 PM. Patient taking differently: Take 40 mg by mouth daily.  02/25/15  Yes Debbe Odea, MD  buPROPion (WELLBUTRIN XL) 150 MG 24 hr tablet Take 150 mg by mouth daily.   Yes [provider]  diclofenac (VOLTAREN) 75 MG EC tablet Take 75 mg by mouth 2 (two) times daily with a meal. 10/15/16  Yes [provider]  escitalopram (LEXAPRO) 10 MG tablet Take 20 mg by mouth daily.    Yes [provider]  levothyroxine (SYNTHROID, LEVOTHROID) 150 MCG tablet Take 175 mcg by mouth daily before breakfast.    Yes [provider]    losartan-hydrochlorothiazide (HYZAAR) 100-25 MG per tablet Take 1 tablet by mouth daily.   Yes [provider]  Magnesium 500 MG TABS Take 500 mg by mouth at bedtime.    Yes [provider]  omeprazole (PRILOSEC) 20 MG capsule Take 20 mg by mouth daily before lunch.    Yes [provider]  aspirin EC 81 MG tablet Take 1 tablet (81 mg total) by mouth daily. Patient taking differently: Take 81 mg by mouth daily as needed (for "heart health").  02/25/15   Debbe Odea, MD  azithromycin (ZITHROMAX) 500 MG tablet Take 1 tablet (500 mg total) by mouth daily for 3 days. Take first 2 tablets together, then 1 every day until finished. 09/11/17 09/14/17  Ok Edwards, PA-C  cetirizine (ZYRTEC) 10 MG chewable tablet Chew 1 tablet (10 mg total) by mouth daily. 07/02/17   Wurst, [place], PA-C  nitroGLYCERIN (NITROSTAT) 0.4 MG SL tablet Place 1 tablet (0.4 mg total) under the tongue every 5 (five) minutes as needed for chest pain. 02/25/15   Debbe Odea, MD  predniSONE (DELTASONE) 50 MG tablet Take 1 tablet (50 mg total) by mouth daily for 5 days. 09/11/17 09/16/17  Ok Edwards, PA-C    Family History Family History  Problem Relation Age of Onset  . Hypertension Mother   . Hypertension Father   . Kidney disease Father   . Diverticulosis Father   . Diabetes Sister   . Diabetes Brother     Social History Social History   Tobacco Use  . Smoking status: Current Every Day Smoker    Packs/day: 0.50    Years: 30.00    Pack years: 15.00    Types: Cigarettes  . Smokeless tobacco: Never Used  . Tobacco comment: 7 cig./day  Substance Use Topics  . Alcohol use: Yes    Comment: infrequently/rare  . Drug use: No     Allergies   Hydrocodone-homatropine   Review of Systems Review of Systems  Reason unable to perform ROS: See HPI as above.     Physical Exam Triage Vital Signs ED Triage Vitals  Enc Vitals Group     BP 09/11/17 1154 (!) 154/94     Pulse Rate 09/11/17 1154 88      Resp 09/11/17 1154 17     Temp 09/11/17 1154 98.4 F (36.9 C)     Temp Source 09/11/17 1154 Oral     SpO2 09/11/17 1154 96 %     Weight --      Height --      Head Circumference --      Peak Flow --      Pain Score 09/11/17 1155 8  Pain Loc --      Pain Edu? --      Excl. in [place]? --    No data found.  Updated Vital Signs BP (!) 154/94 (BP Location: Left Arm)   Pulse 88   Temp 98.4 F (36.9 C) (Oral)   Resp 17   SpO2 96%   Physical Exam  Constitutional: She is oriented to person, place, and time. She appears well-developed and well-nourished. No distress.  HENT:  Head: Normocephalic and atraumatic.  Eyes: Pupils are equal, round, and reactive to light. Conjunctivae are normal.  Cardiovascular: Normal rate, regular rhythm and normal heart sounds. Exam reveals no gallop and no friction rub.  No murmur heard. Pulmonary/Chest: Effort normal and breath sounds normal. She has no wheezes. She has no rales.  Abdominal: Soft. Bowel sounds are normal. She exhibits no mass. There is no tenderness. There is no rebound, no guarding and no CVA tenderness.  Neurological: She is alert and oriented to person, place, and time.  Skin: Skin is warm and dry.  Psychiatric: She has a normal mood and affect. Her behavior is normal. Judgment normal.     UC Treatments / Results  Labs (all labs ordered are listed, but only abnormal results are displayed) Labs Reviewed  POCT URINALYSIS DIP (DEVICE)    EKG None  Radiology No results found.  Procedures Procedures (including critical care time)  Medications Ordered in UC Medications - No data to display  Initial Impression / Assessment and Plan / UC Course  I have reviewed the triage vital signs and the nursing notes.  Pertinent labs & imaging results that were available during my care of the patient were reviewed by me and considered in my medical decision making (see chart for details).    Given 2 weeks history of diarrhea,  will treat empirically for presumed infectious diarrhea, start azithromycin as directed.  Although patient with recent antibiotic use, stools are no longer watery, and low suspicion for C. difficile.  Push fluids, bland diet as discussed.  Return precautions given.  Patient expresses understanding and agrees to plan.  Patient would like to continue Robaxin for now. Patient with similar symptoms in the past, and had good improvement on prednisone as well.  Rx of prednisone provided, patient can start if symptoms not improving with Robaxin.  Return precautions given.  Patient expresses understanding and agrees to plan.  Final Clinical Impressions(s) / UC Diagnoses   Final diagnoses:  Diarrhea of presumed infectious origin    ED Prescriptions    Medication Sig Dispense Auth. Provider   azithromycin (ZITHROMAX) 500 MG tablet Take 1 tablet (500 mg total) by mouth daily for 3 days. Take first 2 tablets together, then 1 every day until finished. 3 tablet Florian Chauca V, PA-C   predniSONE (DELTASONE) 50 MG tablet Take 1 tablet (50 mg total) by mouth daily for 5 days. 5 tablet Tobin Chad, [place] 09/11/17 1253

## 2017-09-11 NOTE — ED Triage Notes (Signed)
Pt resents to [facility] for lower back pain since yesterday and diarrhea x2 weeks.

## 2017-10-14 DIAGNOSIS — Z8679 Personal history of other diseases of the circulatory system: Secondary | ICD-10-CM | POA: Diagnosis not present

## 2017-10-14 DIAGNOSIS — K219 Gastro-esophageal reflux disease without esophagitis: Secondary | ICD-10-CM | POA: Diagnosis not present

## 2017-10-14 DIAGNOSIS — E039 Hypothyroidism, unspecified: Secondary | ICD-10-CM | POA: Diagnosis not present

## 2017-10-14 DIAGNOSIS — I10 Essential (primary) hypertension: Secondary | ICD-10-CM | POA: Diagnosis not present

## 2017-10-14 DIAGNOSIS — E782 Mixed hyperlipidemia: Secondary | ICD-10-CM | POA: Diagnosis not present

## 2017-11-19 DIAGNOSIS — R079 Chest pain, unspecified: Secondary | ICD-10-CM | POA: Diagnosis not present

## 2017-11-19 DIAGNOSIS — R109 Unspecified abdominal pain: Secondary | ICD-10-CM | POA: Diagnosis not present

## 2017-11-19 DIAGNOSIS — R0789 Other chest pain: Secondary | ICD-10-CM | POA: Diagnosis not present

## 2018-01-24 ENCOUNTER — Observation Stay (HOSPITAL_COMMUNITY)
Admission: EM | Admit: 2018-01-24 | Discharge: 2018-01-25 | Disposition: A | Discharge status: Home / Self Care | Payer: 59 | Attending: Internal Medicine | Admitting: Internal Medicine

## 2018-01-24 ENCOUNTER — Encounter (HOSPITAL_COMMUNITY): Payer: Self-pay

## 2018-01-24 ENCOUNTER — Other Ambulatory Visit: Payer: Self-pay

## 2018-01-24 ENCOUNTER — Emergency Department (HOSPITAL_COMMUNITY): Payer: 59

## 2018-01-24 DIAGNOSIS — J439 Emphysema, unspecified: Secondary | ICD-10-CM | POA: Diagnosis not present

## 2018-01-24 DIAGNOSIS — I10 Essential (primary) hypertension: Secondary | ICD-10-CM | POA: Diagnosis not present

## 2018-01-24 DIAGNOSIS — E785 Hyperlipidemia, unspecified: Secondary | ICD-10-CM | POA: Diagnosis not present

## 2018-01-24 DIAGNOSIS — Z720 Tobacco use: Secondary | ICD-10-CM | POA: Diagnosis present

## 2018-01-24 DIAGNOSIS — K219 Gastro-esophageal reflux disease without esophagitis: Secondary | ICD-10-CM | POA: Insufficient documentation

## 2018-01-24 DIAGNOSIS — R918 Other nonspecific abnormal finding of lung field: Secondary | ICD-10-CM | POA: Diagnosis not present

## 2018-01-24 DIAGNOSIS — M170 Bilateral primary osteoarthritis of knee: Secondary | ICD-10-CM | POA: Diagnosis not present

## 2018-01-24 DIAGNOSIS — R0789 Other chest pain: Secondary | ICD-10-CM | POA: Diagnosis not present

## 2018-01-24 DIAGNOSIS — J9811 Atelectasis: Secondary | ICD-10-CM | POA: Diagnosis not present

## 2018-01-24 DIAGNOSIS — Z8249 Family history of ischemic heart disease and other diseases of the circulatory system: Secondary | ICD-10-CM | POA: Diagnosis not present

## 2018-01-24 DIAGNOSIS — Z833 Family history of diabetes mellitus: Secondary | ICD-10-CM | POA: Diagnosis not present

## 2018-01-24 DIAGNOSIS — F329 Major depressive disorder, single episode, unspecified: Secondary | ICD-10-CM | POA: Insufficient documentation

## 2018-01-24 DIAGNOSIS — I700 Atherosclerosis of aorta: Secondary | ICD-10-CM | POA: Diagnosis not present

## 2018-01-24 DIAGNOSIS — E890 Postprocedural hypothyroidism: Secondary | ICD-10-CM | POA: Diagnosis not present

## 2018-01-24 DIAGNOSIS — Z7982 Long term (current) use of aspirin: Secondary | ICD-10-CM | POA: Diagnosis not present

## 2018-01-24 DIAGNOSIS — R072 Precordial pain: Secondary | ICD-10-CM | POA: Diagnosis not present

## 2018-01-24 DIAGNOSIS — Z79899 Other long term (current) drug therapy: Secondary | ICD-10-CM | POA: Diagnosis not present

## 2018-01-24 DIAGNOSIS — Z8379 Family history of other diseases of the digestive system: Secondary | ICD-10-CM | POA: Diagnosis not present

## 2018-01-24 DIAGNOSIS — F17210 Nicotine dependence, cigarettes, uncomplicated: Secondary | ICD-10-CM | POA: Diagnosis not present

## 2018-01-24 DIAGNOSIS — D259 Leiomyoma of uterus, unspecified: Secondary | ICD-10-CM | POA: Diagnosis not present

## 2018-01-24 DIAGNOSIS — R079 Chest pain, unspecified: Secondary | ICD-10-CM | POA: Diagnosis present

## 2018-01-24 DIAGNOSIS — Z885 Allergy status to narcotic agent status: Secondary | ICD-10-CM | POA: Diagnosis not present

## 2018-01-24 DIAGNOSIS — R000 Tachycardia, unspecified: Secondary | ICD-10-CM | POA: Diagnosis not present

## 2018-01-24 DIAGNOSIS — F32A Depression, unspecified: Secondary | ICD-10-CM | POA: Diagnosis present

## 2018-01-24 DIAGNOSIS — Z9119 Patient's noncompliance with other medical treatment and regimen: Secondary | ICD-10-CM | POA: Insufficient documentation

## 2018-01-24 DIAGNOSIS — E039 Hypothyroidism, unspecified: Secondary | ICD-10-CM | POA: Diagnosis present

## 2018-01-24 LAB — HEPATIC FUNCTION PANEL
ALT: 18 U/L (ref 0–44)
AST: 19 U/L (ref 15–41)
Albumin: 4 g/dL (ref 3.5–5.0)
Alkaline Phosphatase: 84 U/L (ref 38–126)
Bilirubin, Direct: 0.1 mg/dL (ref 0.0–0.2)
Indirect Bilirubin: 0.4 mg/dL (ref 0.3–0.9)
Total Bilirubin: 0.5 mg/dL (ref 0.3–1.2)
Total Protein: 7.5 g/dL (ref 6.5–8.1)

## 2018-01-24 LAB — CBC
HCT: 47.2 % — ABNORMAL HIGH (ref 36.0–46.0)
Hemoglobin: 15.2 g/dL — ABNORMAL HIGH (ref 12.0–15.0)
MCH: 30.5 pg (ref 26.0–34.0)
MCHC: 32.2 g/dL (ref 30.0–36.0)
MCV: 94.6 fL (ref 80.0–100.0)
Platelets: 436 10*3/uL — ABNORMAL HIGH (ref 150–400)
RBC: 4.99 MIL/uL (ref 3.87–5.11)
RDW: 13.2 % (ref 11.5–15.5)
WBC: 10.4 10*3/uL (ref 4.0–10.5)
nRBC: 0 % (ref 0.0–0.2)

## 2018-01-24 LAB — URINALYSIS, ROUTINE W REFLEX MICROSCOPIC
Bilirubin Urine: NEGATIVE
Glucose, UA: NEGATIVE mg/dL
HGB URINE DIPSTICK: NEGATIVE
Ketones, ur: NEGATIVE mg/dL
Leukocytes, UA: NEGATIVE
Nitrite: NEGATIVE
Protein, ur: NEGATIVE mg/dL
Specific Gravity, Urine: 1.009 (ref 1.005–1.030)
pH: 7 (ref 5.0–8.0)

## 2018-01-24 LAB — BASIC METABOLIC PANEL
ANION GAP: 9 (ref 5–15)
BUN: 14 mg/dL (ref 6–20)
CO2: 28 mmol/L (ref 22–32)
Calcium: 9.2 mg/dL (ref 8.9–10.3)
Chloride: 100 mmol/L (ref 98–111)
Creatinine, Ser: 0.89 mg/dL (ref 0.44–1.00)
GFR calc Af Amer: >60 mL/min (ref >60)
GFR calc non Af Amer: >60 mL/min (ref >60)
GLUCOSE: 124 mg/dL — AB (ref 70–99)
Potassium: 3.1 mmol/L — ABNORMAL LOW (ref 3.5–5.1)
Sodium: 137 mmol/L (ref 135–145)

## 2018-01-24 LAB — I-STAT TROPONIN, ED
Troponin i, poc: 0.01 ng/mL (ref 0.00–0.08)
Troponin i, poc: 0.04 ng/mL (ref 0.00–0.08)

## 2018-01-24 LAB — LIPASE, BLOOD: Lipase: 43 U/L (ref 11–51)

## 2018-01-24 MED ORDER — IOPAMIDOL (ISOVUE-370) INJECTION 76%
INTRAVENOUS | Status: AC
  Filled 2018-01-24: qty 100

## 2018-01-24 MED ORDER — ONDANSETRON HCL 4 MG/2ML IJ SOLN
4.0000 mg | Freq: Once | INTRAMUSCULAR | Status: AC
  Administered 2018-01-24: 4 mg via INTRAVENOUS
  Filled 2018-01-24: qty 2

## 2018-01-24 MED ORDER — PANTOPRAZOLE SODIUM 40 MG PO TBEC
40.0000 mg | DELAYED_RELEASE_TABLET | Freq: Every day | ORAL | Status: DC
  Administered 2018-01-25: 40 mg via ORAL
  Filled 2018-01-24: qty 1

## 2018-01-24 MED ORDER — MAGNESIUM OXIDE 400 (241.3 MG) MG PO TABS
400.0000 mg | ORAL_TABLET | Freq: Every day | ORAL | Status: DC
  Administered 2018-01-24: 400 mg via ORAL
  Filled 2018-01-24: qty 1

## 2018-01-24 MED ORDER — LEVOTHYROXINE SODIUM 175 MCG PO TABS
175.0000 ug | ORAL_TABLET | Freq: Every day | ORAL | Status: DC
  Administered 2018-01-25: 175 ug via ORAL
  Filled 2018-01-24: qty 1

## 2018-01-24 MED ORDER — ACETAMINOPHEN 325 MG PO TABS
650.0000 mg | ORAL_TABLET | ORAL | Status: DC | PRN
  Administered 2018-01-25: 650 mg via ORAL
  Filled 2018-01-24: qty 2

## 2018-01-24 MED ORDER — POTASSIUM CHLORIDE CRYS ER 20 MEQ PO TBCR
40.0000 meq | EXTENDED_RELEASE_TABLET | ORAL | Status: AC
  Administered 2018-01-24: 40 meq via ORAL
  Filled 2018-01-24: qty 2

## 2018-01-24 MED ORDER — ONDANSETRON HCL 4 MG/2ML IJ SOLN
4.0000 mg | Freq: Four times a day (QID) | INTRAMUSCULAR | Status: DC | PRN
  Administered 2018-01-25: 4 mg via INTRAVENOUS
  Filled 2018-01-24: qty 2

## 2018-01-24 MED ORDER — MORPHINE SULFATE (PF) 4 MG/ML IV SOLN
4.0000 mg | Freq: Once | INTRAVENOUS | Status: AC
  Administered 2018-01-24: 4 mg via INTRAVENOUS
  Filled 2018-01-24: qty 1

## 2018-01-24 MED ORDER — AMLODIPINE BESYLATE 10 MG PO TABS
10.0000 mg | ORAL_TABLET | Freq: Every day | ORAL | Status: DC
  Administered 2018-01-25: 10 mg via ORAL
  Filled 2018-01-24: qty 1

## 2018-01-24 MED ORDER — BUPROPION HCL ER (XL) 150 MG PO TB24: 150.0000 mg | ORAL_TABLET | Freq: Every day | ORAL | Status: DC

## 2018-01-24 MED ORDER — MORPHINE SULFATE (PF) 2 MG/ML IV SOLN: 2.0000 mg | INTRAVENOUS | Status: DC | PRN

## 2018-01-24 MED ORDER — IOPAMIDOL (ISOVUE-370) INJECTION 76%
100.0000 mL | Freq: Once | INTRAVENOUS | Status: AC | PRN
  Administered 2018-01-24: 100 mL via INTRAVENOUS

## 2018-01-24 MED ORDER — SODIUM CHLORIDE 0.9 % IV SOLN
Freq: Once | INTRAVENOUS | Status: AC
  Administered 2018-01-24: 18:00:00 via INTRAVENOUS

## 2018-01-24 MED ORDER — FAMOTIDINE IN NACL 20-0.9 MG/50ML-% IV SOLN
20.0000 mg | Freq: Once | INTRAVENOUS | Status: AC
  Administered 2018-01-24: 20 mg via INTRAVENOUS
  Filled 2018-01-24: qty 50

## 2018-01-24 MED ORDER — DICYCLOMINE HCL 10 MG/5ML PO SOLN
10.0000 mg | Freq: Once | ORAL | Status: AC
  Administered 2018-01-24: 10 mg via ORAL
  Filled 2018-01-24: qty 5

## 2018-01-24 MED ORDER — ESCITALOPRAM OXALATE 10 MG PO TABS: 20.0000 mg | ORAL_TABLET | Freq: Every day | ORAL | Status: DC

## 2018-01-24 MED ORDER — NITROGLYCERIN 2 % TD OINT
1.0000 [in_us] | TOPICAL_OINTMENT | Freq: Four times a day (QID) | TRANSDERMAL | Status: DC
  Administered 2018-01-24 – 2018-01-25 (×2): 1 [in_us] via TOPICAL
  Filled 2018-01-24 (×4): qty 1

## 2018-01-24 MED ORDER — LOSARTAN POTASSIUM-HCTZ 100-25 MG PO TABS: 1.0000 | ORAL_TABLET | Freq: Every day | ORAL | Status: DC

## 2018-01-24 MED ORDER — LOSARTAN POTASSIUM 50 MG PO TABS
100.0000 mg | ORAL_TABLET | Freq: Every day | ORAL | Status: DC
  Administered 2018-01-25: 100 mg via ORAL
  Filled 2018-01-24: qty 2

## 2018-01-24 MED ORDER — ENOXAPARIN SODIUM 40 MG/0.4ML ~~LOC~~ SOLN
40.0000 mg | SUBCUTANEOUS | Status: DC
  Administered 2018-01-24: 40 mg via SUBCUTANEOUS
  Filled 2018-01-24: qty 0.4

## 2018-01-24 MED ORDER — ASPIRIN EC 81 MG PO TBEC
81.0000 mg | DELAYED_RELEASE_TABLET | Freq: Every day | ORAL | Status: DC
  Administered 2018-01-25: 81 mg via ORAL
  Filled 2018-01-24: qty 1

## 2018-01-24 MED ORDER — PANTOPRAZOLE SODIUM 40 MG IV SOLR
40.0000 mg | Freq: Once | INTRAVENOUS | Status: AC
  Administered 2018-01-24: 40 mg via INTRAVENOUS
  Filled 2018-01-24: qty 40

## 2018-01-24 MED ORDER — ASPIRIN 81 MG PO CHEW
324.0000 mg | CHEWABLE_TABLET | Freq: Once | ORAL | Status: AC
  Administered 2018-01-24: 324 mg via ORAL
  Filled 2018-01-24: qty 4

## 2018-01-24 MED ORDER — SUCRALFATE 1 G PO TABS
1.0000 g | ORAL_TABLET | Freq: Once | ORAL | Status: AC
  Administered 2018-01-24: 1 g via ORAL
  Filled 2018-01-24: qty 1

## 2018-01-24 MED ORDER — ATORVASTATIN CALCIUM 40 MG PO TABS: 40.0000 mg | ORAL_TABLET | Freq: Every day | ORAL | Status: DC

## 2018-01-24 MED ORDER — HYDROCHLOROTHIAZIDE 25 MG PO TABS
25.0000 mg | ORAL_TABLET | Freq: Every day | ORAL | Status: DC
  Administered 2018-01-25: 25 mg via ORAL
  Filled 2018-01-24: qty 1

## 2018-01-24 NOTE — ED Notes (Signed)
Patient transported to CT 

## 2018-01-24 NOTE — ED Triage Notes (Signed)
Pt presents for evaluation of central chest tightness with radiation to L chest. Pt states started this morning and has worsened. Hx of hypertension and GERD.

## 2018-01-24 NOTE — ED Provider Notes (Signed)
[facility] EMERGENCY DEPARTMENT Provider Note   CSN: [id number] Arrival date & time: 01/24/18  1446     History   Chief Complaint Chief Complaint  Patient presents with  . Chest Pain    HPI Sheri Villarreal is a 57 y.o. female.  HPI Patient reports she awoke feeling normal this morning.  She went to work and shortly after getting to work started to get an intense tight, rocklike feeling in the center of her lower chest.  She reports he discontinued and did not seem to really ease up.  She did not develop any vomiting.  She reports that she did not break out in a sweat.  She did feel somewhat like it was harder to take a deep breath.  Because she did not have much associated symptoms, she did not come straight to the hospital.  She reports at lunchtime she tried to eat some spaghetti but just felt like it stuck in her throat.  She reports it did not actually stick.  She did not have to vomit it back up for spit saliva after that.  She reports that she does have pretty severe reflux and takes daily omeprazole.  She reports family history is positive for esophageal strictures.  She however has not been suffering from food sticking and not passing and she has been eating.  She reports today symptoms are quite new for her.  She denies she is been experiencing any exertional shortness of breath or chest pain in the past weeks.  No lower extremity swelling or calf pain.  She reports work has been extra stressful recently and some elements of this feel like severe anxiety but she denies she is ever had a similar degree of chest pain. Past Medical History:  Diagnosis Date  . Acute bronchitis 02/18/2016  . Arthritis    knees  . Carpal tunnel syndrome of right wrist 06/2013  . Chest pain 02/24/2015  . Dental crowns present   . Depression   . Essential hypertension   . GERD (gastroesophageal reflux disease)   . GOITER, MULTINODULAR 09/05/2007   Qualifier: Diagnosis of  By:  Loanne Drilling MD, Jacelyn Pi   . History of hyperthyroidism   . Hypertension    under control with med., has been on med. x 9 yr.  . Hypertensive urgency    under control with med., has been on med. x 9 yr.   . HYPERTHYROIDISM 09/05/2007   Qualifier: Diagnosis of  By: Loanne Drilling MD, Jacelyn Pi   . Hypothyroidism 02/24/2015  . Pain in the chest   . Palpitations 05/19/2017  . Thyroid disease   . Tobacco abuse   . Weakness 02/18/2016    Patient Active Problem List   Diagnosis Date Noted  . Palpitations 05/19/2017  . Acute bronchitis 02/18/2016  . Weakness 02/18/2016  . Chest pain 02/24/2015  . Depression 02/24/2015  . Hypothyroidism 02/24/2015  . GERD (gastroesophageal reflux disease)   . Hypertensive urgency   . Arthritis   . Tobacco abuse   . Pain in the chest   . Essential hypertension   . GOITER, MULTINODULAR 09/05/2007  . HYPERTHYROIDISM 09/05/2007    Past Surgical History:  Procedure Laterality Date  . APPENDECTOMY    . CARPAL TUNNEL RELEASE Right 07/04/2013   Procedure: RIGHT CARPAL TUNNEL RELEASE;  Surgeon: Wynonia Sours, MD;  Location: [facility];  Service: Orthopedics;  Laterality: Right;  . HERNIA REPAIR    . INGUINAL HERNIA REPAIR    .  KNEE ARTHROSCOPY Right      OB History   None      Home Medications    Prior to Admission medications   Medication Sig Start Date End Date Taking? Authorizing Provider  acetaminophen (TYLENOL) 500 MG tablet Take 1,000 mg by mouth as needed for mild pain.     [provider]  amLODipine (NORVASC) 10 MG tablet Take 1 tablet (10 mg total) by mouth daily. 02/25/15   Debbe Odea, MD  aspirin EC 81 MG tablet Take 1 tablet (81 mg total) by mouth daily. Patient taking differently: Take 81 mg by mouth daily as needed (for "heart health").  02/25/15   Debbe Odea, MD  atorvastatin (LIPITOR) 40 MG tablet Take 1 tablet (40 mg total) by mouth daily at 6 PM. Patient taking differently: Take 40 mg by mouth daily.  02/25/15   Debbe Odea, MD  buPROPion (WELLBUTRIN XL) 150 MG 24 hr tablet Take 150 mg by mouth daily.    [provider]  cetirizine (ZYRTEC) 10 MG chewable tablet Chew 1 tablet (10 mg total) by mouth daily. 07/02/17   Wurst, [place], PA-C  diclofenac (VOLTAREN) 75 MG EC tablet Take 75 mg by mouth 2 (two) times daily with a meal. 10/15/16   [provider]  escitalopram (LEXAPRO) 10 MG tablet Take 20 mg by mouth daily.     [provider]  levothyroxine (SYNTHROID, LEVOTHROID) 150 MCG tablet Take 175 mcg by mouth daily before breakfast.     [provider]  losartan-hydrochlorothiazide (HYZAAR) 100-25 MG per tablet Take 1 tablet by mouth daily.    [provider]  Magnesium 500 MG TABS Take 500 mg by mouth at bedtime.     [provider]  nitroGLYCERIN (NITROSTAT) 0.4 MG SL tablet Place 1 tablet (0.4 mg total) under the tongue every 5 (five) minutes as needed for chest pain. 02/25/15   Debbe Odea, MD  omeprazole (PRILOSEC) 20 MG capsule Take 20 mg by mouth daily before lunch.     [provider]    Family History Family History  Problem Relation Age of Onset  . Hypertension Mother   . Hypertension Father   . Kidney disease Father   . Diverticulosis Father   . Diabetes Sister   . Diabetes Brother     Social History Social History   Tobacco Use  . Smoking status: Current Every Day Smoker    Packs/day: 0.50    Years: 30.00    Pack years: 15.00    Types: Cigarettes  . Smokeless tobacco: Never Used  . Tobacco comment: 7 cig./day  Substance Use Topics  . Alcohol use: Yes    Comment: infrequently/rare  . Drug use: No     Allergies   Hydrocodone-homatropine   Review of Systems Review of Systems 10 Systems reviewed and are negative for acute change except as noted in the HPI.   Physical Exam Updated Vital Signs BP 134/87 (BP Location: Right Arm)   Pulse 94   Temp 97.9 F (36.6 C) (Oral)   Resp 13   SpO2 99%   Physical  Exam  Constitutional: She is oriented to person, place, and time.  Alert and nontoxic, no resp distress.  Patient is anxious in appearance and uncomfortable.  She is slightly tearful.  Mental status is clear.  HENT:  Head: Normocephalic and atraumatic.  Mouth/Throat: Oropharynx is clear and moist.  Eyes: Pupils are equal, round, and reactive to light. EOM are normal.  Neck:  Neck supple.  Cardiovascular: Normal rate, regular rhythm, normal heart sounds and intact distal pulses.  Pulmonary/Chest: Effort normal and breath sounds normal.  Abdominal: Soft.  Patient abdomen is soft.  She does have significant severe reproducible discomfort to palpation in the epigastrium and right upper quadrant.  Lower abdomen is nontender.  Musculoskeletal: Normal range of motion. She exhibits no edema or tenderness.  Neurological: She is alert and oriented to person, place, and time. She exhibits normal muscle tone. Coordination normal.  Skin: Skin is warm and dry.  Psychiatric:  Patient is anxious and slightly tearful.  She is appropriate and interactive.     ED Treatments / Results  Labs (all labs ordered are listed, but only abnormal results are displayed) Labs Reviewed  BASIC METABOLIC PANEL - Abnormal; Notable for the following components:      Result Value   Potassium 3.1 (*)    Glucose, Bld 124 (*)    All other components within normal limits  CBC - Abnormal; Notable for the following components:   Hemoglobin 15.2 (*)    HCT 47.2 (*)    Platelets 436 (*)    All other components within normal limits  URINALYSIS, ROUTINE W REFLEX MICROSCOPIC - Abnormal; Notable for the following components:   Color, Urine STRAW (*)    All other components within normal limits  HEPATIC FUNCTION PANEL  LIPASE, BLOOD  I-STAT TROPONIN, ED  I-STAT TROPONIN, ED    EKG EKG Interpretation  Date/Time:  Monday January 24 2018 15:05:16 EST Ventricular Rate:  104 PR Interval:  150 QRS Duration: 76 QT  Interval:  362 QTC Calculation: 476 R Axis:   46 Text Interpretation:  Sinus tachycardia Possible Left atrial enlargement Nonspecific ST abnormality Abnormal ECG agree. simialr to old Confirmed by Charlesetta Shanks [phone]) on 01/24/2018 3:25:39 PM   Radiology Dg Chest 2 View  Result Date: 01/24/2018 CLINICAL DATA:  Worsening chest pain. EXAM: CHEST - 2 VIEW COMPARISON:  February 17, 2016 FINDINGS: Mild opacity in left base. The heart, hila, mediastinum, lungs, and pleura are otherwise normal. IMPRESSION: Minimal opacity in left base favored represent atelectasis. Subtle infiltrate considered less likely. Recommend short-term follow-up to ensure resolution. Electronically Signed   By: Dorise Bullion III M.D   On: 01/24/2018 16:45   Ct Angio Chest/abd/pel For Dissection W And/or W/wo  Result Date: 01/24/2018 CLINICAL DATA:  Acute onset severe central chest pain and pressure with inability to take deep breaths. EXAM: CT ANGIOGRAPHY CHEST, ABDOMEN AND PELVIS TECHNIQUE: Multidetector CT imaging through the chest, abdomen and pelvis was performed using the standard protocol during bolus administration of intravenous contrast. Multiplanar reconstructed images and MIPs were obtained and reviewed to evaluate the vascular anatomy. CONTRAST:  131mL ISOVUE-370 IOPAMIDOL (ISOVUE-370) INJECTION 76% COMPARISON:  CTA chest dated August 22, 2007. FINDINGS: CTA CHEST FINDINGS Cardiovascular: Preferential opacification of the thoracic aorta. No evidence of thoracic aortic aneurysm, intramural hematoma, or dissection. Normal heart size. No pericardial effusion. Mild thoracic aortic atherosclerosis. No pulmonary embolism. Mediastinum/Nodes: No enlarged mediastinal, hilar, or axillary lymph nodes. Thyroid gland, trachea, and esophagus demonstrate no significant findings. Lungs/Pleura: Mild centrilobular emphysema. No focal consolidation, pleural effusion, or pneumothorax. Unchanged atelectasis/scarring in the lingula. Mild  bilateral lower lobe subsegmental atelectasis. No suspicious pulmonary nodule. Musculoskeletal: No chest wall abnormality. No acute or significant osseous findings. Review of the MIP images confirms the above findings. CTA ABDOMEN AND PELVIS FINDINGS VASCULAR Aorta: Normal caliber aorta without aneurysm, dissection, vasculitis or significant stenosis. Mild calcified and noncalcified  atherosclerotic plaque. Celiac: Patent without evidence of aneurysm, dissection, vasculitis or significant stenosis. The right hepatic artery appears to arise directly off of the aorta. SMA: Patent without evidence of aneurysm, dissection, vasculitis or significant stenosis. Renals: Both renal arteries are patent without evidence of aneurysm, dissection, vasculitis, fibromuscular dysplasia or significant stenosis. IMA: Patent without evidence of aneurysm, dissection, vasculitis or significant stenosis. Inflow: Patent without evidence of aneurysm, dissection, vasculitis or significant stenosis. Veins: No obvious venous abnormality within the limitations of this arterial phase study. Review of the MIP images confirms the above findings. NON-VASCULAR Hepatobiliary: No focal liver abnormality is seen. No gallstones, gallbladder wall thickening, or biliary dilatation. Pancreas: Unremarkable. No pancreatic ductal dilatation or surrounding inflammatory changes. Spleen: Normal in size without focal abnormality. Adrenals/Urinary Tract: Adrenal glands are unremarkable. Kidneys are normal, without renal calculi, focal lesion, or hydronephrosis. Bladder is unremarkable. Stomach/Bowel: Stomach is within normal limits. Appendix is not visualized. No evidence of bowel wall thickening, distention, or inflammatory changes. There are a few colonic diverticula at the splenic flexure. Lymphatic: No enlarged abdominal or pelvic lymph nodes. Reproductive: 3.7 cm uterine fibroid.  No adnexal mass. Other: No abdominal wall hernia or abnormality. No  abdominopelvic ascites. No pneumoperitoneum. Musculoskeletal: No acute or significant osseous findings. Review of the MIP images confirms the above findings. IMPRESSION: Vascular: 1. No evidence of thoracoabdominal aortic dissection, intramural hematoma, or aneurysm. 2. No pulmonary embolism. 3.  Aortic atherosclerosis (ICD10-I70.0). Chest: 1.  No acute intrathoracic process. 2.  Emphysema (ICD10-J43.9). Abdomen and pelvis: 1.  No acute intra-abdominal process. 2. Fibroid uterus. Electronically Signed   By: Titus Dubin M.D.   On: 01/24/2018 18:14    Procedures Procedures (including critical care time)  Medications Ordered in ED Medications  iopamidol (ISOVUE-370) 76 % injection (has no administration in time range)  nitroGLYCERIN (NITROGLYN) 2 % ointment 1 inch (has no administration in time range)  morphine 4 MG/ML injection 4 mg (4 mg Intravenous Given 01/24/18 1612)  ondansetron (ZOFRAN) injection 4 mg (4 mg Intravenous Given 01/24/18 1612)  famotidine (PEPCID) IVPB 20 mg premix (0 mg Intravenous Stopped 01/24/18 1748)  aspirin chewable tablet 324 mg (324 mg Oral Given 01/24/18 1613)  0.9 %  sodium chloride infusion ( Intravenous Stopped 01/24/18 2017)  iopamidol (ISOVUE-370) 76 % injection 100 mL (100 mLs Intravenous Contrast Given 01/24/18 1747)  sucralfate (CARAFATE) tablet 1 g (1 g Oral Given 01/24/18 1910)  pantoprazole (PROTONIX) injection 40 mg (40 mg Intravenous Given 01/24/18 1911)     Initial Impression / Assessment and Plan / ED Course  I have reviewed the triage vital signs and the nursing notes.  Pertinent labs & imaging results that were available during my care of the patient were reviewed by me and considered in my medical decision making (see chart for details).  Clinical Course as of Jan 25 2036  Mon Jan 24, 2018  2016 Cardiology consutlordered.   [MP]  2021 Recheck: Patient reports the chest pain did get much better with treatment however when she has gotten up to walk  to the bathroom or exert herself slightly, she is getting some chest pains again.  Will add nitroglycerin and consult cardiology.   [MP]  2035 Consult: Reviewed with [place] cardiology Fellow. Advised admit to hospitalist to cycle enzymes and stress testing. Reconsult Cardiology if enzyme elevation. Agrees EKG although with ST depression is similar to previous EKG.   [MP]    Clinical Course User Index [MP] Charlesetta Shanks, MD   Patient with  chest pain as outlined.  She does have some nonspecific changes on the EKG but this does appear consistent with previous.  Patient however had recrudescence of chest pain with getting up and walking around.  At this time plan will be for admission as outlined. Final Clinical Impressions(s) / ED Diagnoses   Final diagnoses:  Precordial pain    ED Discharge Orders    None       Charlesetta Shanks, MD 01/30/18 [phone]

## 2018-01-24 NOTE — H&P (Signed)
History and Physical    Sheri Villarreal YKZ:[id number] DOB: Nov 12, 1960 DOA: 01/24/2018  Referring MD/NP/PA: Tomasa Rand, MD PCP: Leighton Ruff, MD  Patient coming from: home  Chief Complaint: Chest pain  I have personally briefly reviewed patient's old medical records in [place]   HPI: Sheri Villarreal is a 57 y.o. female with medical history significant of HTN, hyperthyroidism s/p ablation, osteoarthritis of the knees, depression, and GERD; who presents with complaints of chest pain after arriving to work this morning.  Pain was located substernally and she describes it as a tightening feeling in her chest though something was sitting there.  Patient reported intermittent twinges of pain that causes her to feel so she was short of breath.  Denies any nausea, vomiting, diaphoresis,lightheadedness, or loss of consciousness.  Describes pain as a 8 out of 10 on the pain scale.  She reports only having coffee crackers for breakfast.  Symptoms worsened as the day went on.  She tried eating something for lunch but felt as though it was stuck in her throat and describes feeling bad discomfort in her neck.  Associated symptoms included feeling tired.  She admits to being off of her levothyroxine due to issues with her insurance for approximately 6 weeks, but recently restarted 1 week ago.  Denies family history for heart disease.  Patient does admit to a history of acid reflux, but reports being on omeprazole for treatment.  He also reports to smoking less than half pack cigarettes per day    ED Course: Upon admission into the emergency department patient was noted to be afebrile, pulse 94-103, blood pressure elevated up to 173/97, and all other vital signs maintained.  Labs revealed WBC 10.4, hemoglobin 15.2, platelets 436, potassium 3.1, troponin 0.01, and repeat troponin 0.04.  EKG showed nonspecific ST-T wave changes.  Chest x-ray showed minimal opacity of the left base favoring  atelectasis over infiltrate.  CT angiogram of the chest abdomen pelvis showed no signs of a PE or dissection.  Patient had been given 324 mg of aspirin Pepcid, Protonix morphine, Zofran, and Carafate.  Given history [facility] called to admit.  Review of Systems  Constitutional: Positive for malaise/fatigue. Negative for chills and fever.  HENT: Negative for ear discharge and nosebleeds.   Respiratory: Positive for shortness of breath. Negative for cough.   Cardiovascular: Positive for chest pain. Negative for leg swelling.  Gastrointestinal: Negative for abdominal pain, nausea and vomiting.  Genitourinary: Negative for dysuria and frequency.  Musculoskeletal: Positive for joint pain.  Skin: Negative for itching.  Neurological: Negative for focal weakness and loss of consciousness.  Psychiatric/Behavioral: Negative for suicidal ideas.  All other systems reviewed and are negative.   Past Medical History:  Diagnosis Date  . Acute bronchitis 02/18/2016  . Arthritis    knees  . Carpal tunnel syndrome of right wrist 06/2013  . Chest pain 02/24/2015  . Dental crowns present   . Depression   . Essential hypertension   . GERD (gastroesophageal reflux disease)   . GOITER, MULTINODULAR 09/05/2007   Qualifier: Diagnosis of  By: Loanne Drilling MD, Jacelyn Pi   . History of hyperthyroidism   . Hypertension    under control with med., has been on med. x 9 yr.  . Hypertensive urgency    under control with med., has been on med. x 9 yr.   . HYPERTHYROIDISM 09/05/2007   Qualifier: Diagnosis of  By: Loanne Drilling MD, Jacelyn Pi   . Hypothyroidism 02/24/2015  .  Pain in the chest   . Palpitations 05/19/2017  . Thyroid disease   . Tobacco abuse   . Weakness 02/18/2016    Past Surgical History:  Procedure Laterality Date  . APPENDECTOMY    . CARPAL TUNNEL RELEASE Right 07/04/2013   Procedure: RIGHT CARPAL TUNNEL RELEASE;  Surgeon: Wynonia Sours, MD;  Location: [place];  Service: Orthopedics;  Laterality:  Right;  . HERNIA REPAIR    . INGUINAL HERNIA REPAIR    . KNEE ARTHROSCOPY Right      reports that she has been smoking cigarettes. She has a 15.00 pack-year smoking history. She has never used smokeless tobacco. She reports that she drinks alcohol. She reports that she does not use drugs.  Allergies  Allergen Reactions  . Hydrocodone-Homatropine Itching    Reaction to Hycodan cough syrup (pt has taken norco in the past with no reaction)    Family History  Problem Relation Age of Onset  . Hypertension Mother   . Hypertension Father   . Kidney disease Father   . Diverticulosis Father   . Diabetes Sister   . Diabetes Brother     Prior to Admission medications   Medication Sig Start Date End Date Taking? Authorizing Provider  acetaminophen (TYLENOL) 500 MG tablet Take 1,000 mg by mouth as needed for mild pain.     [provider]  amLODipine (NORVASC) 10 MG tablet Take 1 tablet (10 mg total) by mouth daily. 02/25/15   Debbe Odea, MD  aspirin EC 81 MG tablet Take 1 tablet (81 mg total) by mouth daily. Patient taking differently: Take 81 mg by mouth daily as needed (for "heart health").  02/25/15   Debbe Odea, MD  atorvastatin (LIPITOR) 40 MG tablet Take 1 tablet (40 mg total) by mouth daily at 6 PM. Patient taking differently: Take 40 mg by mouth daily.  02/25/15   Debbe Odea, MD  buPROPion (WELLBUTRIN XL) 150 MG 24 hr tablet Take 150 mg by mouth daily.    [provider]  cetirizine (ZYRTEC) 10 MG chewable tablet Chew 1 tablet (10 mg total) by mouth daily. 07/02/17   Wurst, [place], PA-C  diclofenac (VOLTAREN) 75 MG EC tablet Take 75 mg by mouth 2 (two) times daily with a meal. 10/15/16   [provider]  escitalopram (LEXAPRO) 10 MG tablet Take 20 mg by mouth daily.     [provider]  levothyroxine (SYNTHROID, LEVOTHROID) 150 MCG tablet Take 175 mcg by mouth daily before breakfast.     [provider]  losartan-hydrochlorothiazide  (HYZAAR) 100-25 MG per tablet Take 1 tablet by mouth daily.    [provider]  Magnesium 500 MG TABS Take 500 mg by mouth at bedtime.     [provider]  nitroGLYCERIN (NITROSTAT) 0.4 MG SL tablet Place 1 tablet (0.4 mg total) under the tongue every 5 (five) minutes as needed for chest pain. 02/25/15   Debbe Odea, MD  omeprazole (PRILOSEC) 20 MG capsule Take 20 mg by mouth daily before lunch.     [provider]    Physical Exam:  Constitutional: NAD, calm, comfortable Vitals:   01/24/18 1715 01/24/18 1805 01/24/18 1915 01/24/18 1955  BP: 139/85 (!) 141/62 (!) 152/92 134/87  Pulse: 94 94    Resp: 18 13    Temp:      TempSrc:      SpO2: 96% 99%     Eyes: PERRL, lids and conjunctivae normal ENMT: Mucous membranes are  moist. Posterior pharynx clear of any exudate or lesions.Normal dentition.  Neck: normal, supple, no masses, no thyromegaly Respiratory: clear to auscultation bilaterally, no wheezing, no crackles. Normal respiratory effort. No accessory muscle use.  Cardiovascular: Regular rate and rhythm, no murmurs / rubs / gallops. No extremity edema. 2+ pedal pulses. No carotid bruits.  Abdomen: no tenderness, no masses palpated. No hepatosplenomegaly. Bowel sounds positive.  Musculoskeletal: no clubbing / cyanosis. No joint deformity upper and lower extremities. Good ROM, no contractures. Normal muscle tone.  Skin: no rashes, lesions, ulcers. No induration Neurologic: CN 2-12 grossly intact. Sensation intact, DTR normal. Strength 5/5 in all 4.  Psychiatric: Normal judgment and insight. Alert and oriented x 3. Normal mood.     Labs on Admission: I have personally reviewed following labs and imaging studies  CBC: Recent Labs  Lab 01/24/18 1504  WBC 10.4  HGB 15.2*  HCT 47.2*  MCV 94.6  PLT 710*   Basic Metabolic Panel: Recent Labs  Lab 01/24/18 1504  NA 137  K 3.1*  CL 100  CO2 28  GLUCOSE 124*  BUN 14  CREATININE 0.89  CALCIUM 9.2    GFR: CrCl cannot be calculated (Unknown ideal weight.). Liver Function Tests: Recent Labs  Lab 01/24/18 1504  AST 19  ALT 18  ALKPHOS 84  BILITOT 0.5  PROT 7.5  ALBUMIN 4.0   Recent Labs  Lab 01/24/18 1504  LIPASE 43   No results for input(s): AMMONIA in the last 168 hours. Coagulation Profile: No results for input(s): INR, PROTIME in the last 168 hours. Cardiac Enzymes: No results for input(s): CKTOTAL, CKMB, CKMBINDEX, TROPONINI in the last 168 hours. BNP (last 3 results) No results for input(s): PROBNP in the last 8760 hours. HbA1C: No results for input(s): HGBA1C in the last 72 hours. CBG: No results for input(s): GLUCAP in the last 168 hours. Lipid Profile: No results for input(s): CHOL, HDL, LDLCALC, TRIG, CHOLHDL, LDLDIRECT in the last 72 hours. Thyroid Function Tests: No results for input(s): TSH, T4TOTAL, FREET4, T3FREE, THYROIDAB in the last 72 hours. Anemia Panel: No results for input(s): VITAMINB12, FOLATE, FERRITIN, TIBC, IRON, RETICCTPCT in the last 72 hours. Urine analysis:    Component Value Date/Time   COLORURINE STRAW (A) 01/24/2018 1548   APPEARANCEUR CLEAR 01/24/2018 1548   LABSPEC 1.009 01/24/2018 1548   PHURINE 7.0 01/24/2018 1548   GLUCOSEU NEGATIVE 01/24/2018 1548   HGBUR NEGATIVE 01/24/2018 1548   BILIRUBINUR NEGATIVE 01/24/2018 1548   KETONESUR NEGATIVE 01/24/2018 1548   PROTEINUR NEGATIVE 01/24/2018 1548   UROBILINOGEN 0.2 09/11/2017 1202   NITRITE NEGATIVE 01/24/2018 1548   LEUKOCYTESUR NEGATIVE 01/24/2018 1548   Sepsis Labs: No results found for this or any previous visit (from the past 240 hour(s)).   Radiological Exams on Admission: Dg Chest 2 View  Result Date: 01/24/2018 CLINICAL DATA:  Worsening chest pain. EXAM: CHEST - 2 VIEW COMPARISON:  February 17, 2016 FINDINGS: Mild opacity in left base. The heart, hila, mediastinum, lungs, and pleura are otherwise normal. IMPRESSION: Minimal opacity in left base favored represent  atelectasis. Subtle infiltrate considered less likely. Recommend short-term follow-up to ensure resolution. Electronically Signed   By: Dorise Bullion III M.D   On: 01/24/2018 16:45   Ct Angio Chest/abd/pel For Dissection W And/or W/wo  Result Date: 01/24/2018 CLINICAL DATA:  Acute onset severe central chest pain and pressure with inability to take deep breaths. EXAM: CT ANGIOGRAPHY CHEST, ABDOMEN AND PELVIS TECHNIQUE: Multidetector CT imaging through the chest, abdomen and  pelvis was performed using the standard protocol during bolus administration of intravenous contrast. Multiplanar reconstructed images and MIPs were obtained and reviewed to evaluate the vascular anatomy. CONTRAST:  163mL ISOVUE-370 IOPAMIDOL (ISOVUE-370) INJECTION 76% COMPARISON:  CTA chest dated August 22, 2007. FINDINGS: CTA CHEST FINDINGS Cardiovascular: Preferential opacification of the thoracic aorta. No evidence of thoracic aortic aneurysm, intramural hematoma, or dissection. Normal heart size. No pericardial effusion. Mild thoracic aortic atherosclerosis. No pulmonary embolism. Mediastinum/Nodes: No enlarged mediastinal, hilar, or axillary lymph nodes. Thyroid gland, trachea, and esophagus demonstrate no significant findings. Lungs/Pleura: Mild centrilobular emphysema. No focal consolidation, pleural effusion, or pneumothorax. Unchanged atelectasis/scarring in the lingula. Mild bilateral lower lobe subsegmental atelectasis. No suspicious pulmonary nodule. Musculoskeletal: No chest wall abnormality. No acute or significant osseous findings. Review of the MIP images confirms the above findings. CTA ABDOMEN AND PELVIS FINDINGS VASCULAR Aorta: Normal caliber aorta without aneurysm, dissection, vasculitis or significant stenosis. Mild calcified and noncalcified atherosclerotic plaque. Celiac: Patent without evidence of aneurysm, dissection, vasculitis or significant stenosis. The right hepatic artery appears to arise directly off of the  aorta. SMA: Patent without evidence of aneurysm, dissection, vasculitis or significant stenosis. Renals: Both renal arteries are patent without evidence of aneurysm, dissection, vasculitis, fibromuscular dysplasia or significant stenosis. IMA: Patent without evidence of aneurysm, dissection, vasculitis or significant stenosis. Inflow: Patent without evidence of aneurysm, dissection, vasculitis or significant stenosis. Veins: No obvious venous abnormality within the limitations of this arterial phase study. Review of the MIP images confirms the above findings. NON-VASCULAR Hepatobiliary: No focal liver abnormality is seen. No gallstones, gallbladder wall thickening, or biliary dilatation. Pancreas: Unremarkable. No pancreatic ductal dilatation or surrounding inflammatory changes. Spleen: Normal in size without focal abnormality. Adrenals/Urinary Tract: Adrenal glands are unremarkable. Kidneys are normal, without renal calculi, focal lesion, or hydronephrosis. Bladder is unremarkable. Stomach/Bowel: Stomach is within normal limits. Appendix is not visualized. No evidence of bowel wall thickening, distention, or inflammatory changes. There are a few colonic diverticula at the splenic flexure. Lymphatic: No enlarged abdominal or pelvic lymph nodes. Reproductive: 3.7 cm uterine fibroid.  No adnexal mass. Other: No abdominal wall hernia or abnormality. No abdominopelvic ascites. No pneumoperitoneum. Musculoskeletal: No acute or significant osseous findings. Review of the MIP images confirms the above findings. IMPRESSION: Vascular: 1. No evidence of thoracoabdominal aortic dissection, intramural hematoma, or aneurysm. 2. No pulmonary embolism. 3.  Aortic atherosclerosis (ICD10-I70.0). Chest: 1.  No acute intrathoracic process. 2.  Emphysema (ICD10-J43.9). Abdomen and pelvis: 1.  No acute intra-abdominal process. 2. Fibroid uterus. Electronically Signed   By: Titus Dubin M.D.   On: 01/24/2018 18:14    EKG:  Independently reviewed.  Sinus tachycardia at 104 bpm with nonspecific ST wave changes  Assessment/Plan Chest pain: Acute.  Patient presents with complaints of substernal chest tightness and heaviness.  Initial troponin negative, but EKG showing ST wave changes.  Question possible cause of cardiac - Admit telemetry bed - Chest pain order set initiated - Trend cardiac troponins overnight - Will need to consult cardiology in a.m. - Follow-up telemetry overnight  Hypothyroid: Patient status post ablation for hyperthyroid symptoms currently on levothyroxine.  Patient had previously been off the medicine for the last 6 weeks but just recently restarted 1 week ago. - Check TSH - Continue Levothyroxine  Osteoarthritis of right knee - Hold Voltaren  Essential hypertension - Continue amlodipine and losartan-hydrochlorothiazide  Depression: Patient reports being on Wellbutrin or Lexapro. - Consider restarting if warranted in the outpatient setting  Hyperlipidemia - Continue atorvastatin  Tobacco abuse: Chronic.  Reports to smoking half pack cigarettes or less per day - Counseled on need of cessation of tobacco use  GERD - Continue pharmacy substitution of Protonix for omeprazole  DVT prophylaxis: Lovenox Code Status: Full Family Communication:none Disposition Plan: TBD Consults called: None Admission status: observation  Norval Morton MD [facility] Pager [phone]   If 7PM-7AM, please contact night-coverage [email or web address] Password [email or web address]  01/24/2018, 9:20 PM

## 2018-01-24 NOTE — ED Notes (Signed)
Main lab called to add on Hepatic and Lipase labwork

## 2018-01-24 NOTE — ED Notes (Signed)
Patient transported to X-ray 

## 2018-01-25 ENCOUNTER — Observation Stay (HOSPITAL_BASED_OUTPATIENT_CLINIC_OR_DEPARTMENT_OTHER): Payer: 59

## 2018-01-25 ENCOUNTER — Encounter (HOSPITAL_COMMUNITY): Payer: Self-pay

## 2018-01-25 DIAGNOSIS — R079 Chest pain, unspecified: Secondary | ICD-10-CM

## 2018-01-25 DIAGNOSIS — I10 Essential (primary) hypertension: Secondary | ICD-10-CM | POA: Diagnosis not present

## 2018-01-25 DIAGNOSIS — E039 Hypothyroidism, unspecified: Secondary | ICD-10-CM | POA: Diagnosis not present

## 2018-01-25 DIAGNOSIS — R072 Precordial pain: Secondary | ICD-10-CM | POA: Diagnosis not present

## 2018-01-25 LAB — TSH: TSH: 39.583 u[IU]/mL — ABNORMAL HIGH (ref 0.350–4.500)

## 2018-01-25 LAB — BASIC METABOLIC PANEL
Anion gap: 10 (ref 5–15)
BUN: 11 mg/dL (ref 6–20)
CO2: 23 mmol/L (ref 22–32)
CREATININE: 0.9 mg/dL (ref 0.44–1.00)
Calcium: 8.4 mg/dL — ABNORMAL LOW (ref 8.9–10.3)
Chloride: 103 mmol/L (ref 98–111)
GFR calc Af Amer: >60 mL/min (ref >60)
GFR calc non Af Amer: >60 mL/min (ref >60)
Glucose, Bld: 109 mg/dL — ABNORMAL HIGH (ref 70–99)
Potassium: 3.5 mmol/L (ref 3.5–5.1)
Sodium: 136 mmol/L (ref 135–145)

## 2018-01-25 LAB — ECHOCARDIOGRAM COMPLETE
Height: 64 in
Weight: 3803.2 oz

## 2018-01-25 LAB — LIPID PANEL
Cholesterol: 245 mg/dL — ABNORMAL HIGH (ref 0–200)
HDL: 42 mg/dL (ref >40)
LDL CALC: 169 mg/dL — AB (ref 0–99)
Total CHOL/HDL Ratio: 5.8 RATIO
Triglycerides: 168 mg/dL — ABNORMAL HIGH (ref <150)
VLDL: 34 mg/dL (ref 0–40)

## 2018-01-25 LAB — CBC WITH DIFFERENTIAL/PLATELET
Abs Immature Granulocytes: 0.02 10*3/uL (ref 0.00–0.07)
BASOS ABS: 0.1 10*3/uL (ref 0.0–0.1)
Basophils Relative: 1 %
Eosinophils Absolute: 0.2 10*3/uL (ref 0.0–0.5)
Eosinophils Relative: 2 %
HCT: 38.9 % (ref 36.0–46.0)
Hemoglobin: 12.5 g/dL (ref 12.0–15.0)
Immature Granulocytes: 0 %
Lymphocytes Relative: 29 %
Lymphs Abs: 2.6 10*3/uL (ref 0.7–4.0)
MCH: 30.5 pg (ref 26.0–34.0)
MCHC: 32.1 g/dL (ref 30.0–36.0)
MCV: 94.9 fL (ref 80.0–100.0)
Monocytes Absolute: 0.7 10*3/uL (ref 0.1–1.0)
Monocytes Relative: 8 %
Neutro Abs: 5.4 10*3/uL (ref 1.7–7.7)
Neutrophils Relative %: 60 %
PLATELETS: 356 10*3/uL (ref 150–400)
RBC: 4.1 MIL/uL (ref 3.87–5.11)
RDW: 13.3 % (ref 11.5–15.5)
WBC: 8.9 10*3/uL (ref 4.0–10.5)
nRBC: 0 % (ref 0.0–0.2)

## 2018-01-25 LAB — TROPONIN I
Troponin I: <0.03 ng/mL (ref <0.03)
Troponin I: <0.03 ng/mL (ref <0.03)
Troponin I: <0.03 ng/mL (ref <0.03)

## 2018-01-25 LAB — MAGNESIUM: Magnesium: 2.2 mg/dL (ref 1.7–2.4)

## 2018-01-25 MED ORDER — ISOSORBIDE MONONITRATE ER 30 MG PO TB24: 15.0000 mg | ORAL_TABLET | Freq: Every day | ORAL | 1 refills | Status: DC

## 2018-01-25 MED ORDER — ISOSORBIDE MONONITRATE ER 30 MG PO TB24
15.0000 mg | ORAL_TABLET | Freq: Every day | ORAL | Status: DC
  Administered 2018-01-25: 15 mg via ORAL
  Filled 2018-01-25: qty 1

## 2018-01-25 MED ORDER — PERFLUTREN LIPID MICROSPHERE
1.0000 mL | INTRAVENOUS | Status: AC | PRN
  Administered 2018-01-25: 4 mL via INTRAVENOUS
  Filled 2018-01-25: qty 10

## 2018-01-25 NOTE — Consult Note (Addendum)
Cardiology Consultation:   Patient ID: Sheri Villarreal; [id number]; 06-25-1960   Admit date: 01/24/2018 Date of Consult: 01/25/2018  Primary Care Provider: Leighton Ruff, MD Primary Cardiologist: Dr. Shirlee More, MD  Patient Profile:   Sheri Villarreal is a 57 y.o. female with a hx of palpitations, HTN, hypothyroidism s/p ablation, depression, tobacco use and GERD who is being seen today for the evaluation of chest pain at the request of Dr. Tamala Julian with internal medicine service.  History of Present Illness:   Sheri Villarreal is a 57 year old female with a history stated above who presented to [facility] on 01/24/2018 with complaints of mid-sternal/epigastric chest pain after arriving to work yesterday morning which lasted until arrival to the ED yesterday evening.  She describes the pain as a tightening feeling in her chest with mild radiation to her mid neck rated as a 8 out of 10 in severity.  She reports mild associated shortness of breath with pain "twinges" however denies nausea, vomiting, diaphoresis, palpitations, lightheadedness or syncope.  She denies recent illness including fevers, chills or cough. She states as the day went on, her symptoms progressed which prompted her to proceed to the emergency department for further evaluation. She has a significant history of GERD and reports feelings of abdominal fullness and tightness which is worse in the evenings. She states that she has had similar symptoms in the past however to a lesser degree. She states that she has been off of her Synthroid secondary to issues with her insurance for approximately 6 weeks however has recently restarted about 1 week ago. TSH on presentation was 39.583. She denies personal or family history of CAD.  She denies alcohol or illicit drug use however reports daily ongoing tobacco use.  She reports increased stress and intermittent episodes of anxiety.  She was recently referred to Dr. Bettina Gavia by her PCP on  05/24/2017 for the evaluation of palpitations.  She had recently been having frequent episodes of skipped heartbeat and brief breathlessness with associated lightheadedness which wax and wane throughout the day however have become more frequent.  She reported that she was under increased stress at work but had no other obvious precipitants. At the time of her appointment, her symptoms had resolved and was recommended that she wear a cardiac monitor however she declined.  She was given information for an adapter for her iPhone in which she could use to capture her heart rhythm and come back to the office on an as-needed basis or if symptoms persisted.  She was instructed to avoid over-the-counter proarrhythmic medications. Her blood pressure was noted to be elevated however patient reported it was well controlled at home.  In the ED, she was found to be hypertensive at 173/97.  Troponin was negative at <0.03 3.  EKG shows nonspecific ST-T wave changes, similar to prior tracings.  CXR with minimal opacity in the left base consistent with atelectasis over infiltrate.  A CTA was performed which showed no evidence of PE or dissection.  Cardiology is been asked to evaluate given her described symptoms.   Past Medical History:  Diagnosis Date  . Acute bronchitis 02/18/2016  . Arthritis    knees  . Carpal tunnel syndrome of right wrist 06/2013  . Chest pain 02/24/2015  . Dental crowns present   . Depression   . Essential hypertension   . GERD (gastroesophageal reflux disease)   . GOITER, MULTINODULAR 09/05/2007   Qualifier: Diagnosis of  By: Loanne Drilling MD, Jacelyn Pi   .  History of hyperthyroidism   . Hypertension    under control with med., has been on med. x 9 yr.  . Hypertensive urgency    under control with med., has been on med. x 9 yr.   . HYPERTHYROIDISM 09/05/2007   Qualifier: Diagnosis of  By: Loanne Drilling MD, Jacelyn Pi   . Hypothyroidism 02/24/2015  . Pain in the chest   . Palpitations 05/19/2017  . Thyroid  disease   . Tobacco abuse   . Weakness 02/18/2016    Past Surgical History:  Procedure Laterality Date  . APPENDECTOMY    . CARPAL TUNNEL RELEASE Right 07/04/2013   Procedure: RIGHT CARPAL TUNNEL RELEASE;  Surgeon: Wynonia Sours, MD;  Location: [place];  Service: Orthopedics;  Laterality: Right;  . HERNIA REPAIR    . INGUINAL HERNIA REPAIR    . KNEE ARTHROSCOPY Right      Prior to Admission medications   Medication Sig Start Date End Date Taking? Authorizing Provider  acetaminophen (TYLENOL) 500 MG tablet Take 1,000 mg by mouth as needed for mild pain.    Yes [provider]  amLODipine (NORVASC) 10 MG tablet Take 1 tablet (10 mg total) by mouth daily. 02/25/15  Yes Debbe Odea, MD  aspirin EC 81 MG tablet Take 1 tablet (81 mg total) by mouth daily. Patient taking differently: Take 81 mg by mouth daily as needed (for "heart health").  02/25/15  Yes Debbe Odea, MD  diclofenac (VOLTAREN) 75 MG EC tablet Take 75 mg by mouth 2 (two) times daily with a meal. 10/15/16  Yes [provider]  levothyroxine (SYNTHROID, LEVOTHROID) 150 MCG tablet Take 175 mcg by mouth daily before breakfast.    Yes [provider]  losartan-hydrochlorothiazide (HYZAAR) 100-25 MG per tablet Take 1 tablet by mouth daily.   Yes [provider]  Magnesium 500 MG TABS Take 500 mg by mouth daily.    Yes [provider]  nitroGLYCERIN (NITROSTAT) 0.4 MG SL tablet Place 1 tablet (0.4 mg total) under the tongue every 5 (five) minutes as needed for chest pain. 02/25/15  Yes Debbe Odea, MD  omeprazole (PRILOSEC) 20 MG capsule Take 20 mg by mouth daily before lunch.    Yes [provider]  cetirizine (ZYRTEC) 10 MG chewable tablet Chew 1 tablet (10 mg total) by mouth daily. Patient not taking: Reported on 01/24/2018 07/02/17   Lestine Box, PA-C    Inpatient Medications: Scheduled Meds: . amLODipine  10 mg Oral Daily  . aspirin EC  81 mg Oral Daily    . enoxaparin (LOVENOX) injection  40 mg Subcutaneous Q24H  . losartan  100 mg Oral Daily   And  . hydrochlorothiazide  25 mg Oral Daily  . levothyroxine  175 mcg Oral QAC breakfast  . magnesium oxide  400 mg Oral QHS  . nitroGLYCERIN  1 inch Topical Q6H  . pantoprazole  40 mg Oral Daily   Continuous Infusions:  PRN Meds: acetaminophen, morphine injection, ondansetron (ZOFRAN) IV  Allergies:    Allergies  Allergen Reactions  . Hydrocodone-Homatropine Itching    Reaction to Hycodan cough syrup (pt has taken norco in the past with no reaction)    Social History:   Social History   Socioeconomic History  . Marital status: Single    Spouse name: Not on file  . Number of children: Not on file  . Years of education: Not on file  . Highest education level: Not on file  Occupational History  .  Not on file  Social Needs  . Financial resource strain: Not on file  . Food insecurity:    Worry: Not on file    Inability: Not on file  . Transportation needs:    Medical: Not on file    Non-medical: Not on file  Tobacco Use  . Smoking status: Current Every Day Smoker    Packs/day: 0.50    Years: 30.00    Pack years: 15.00    Types: Cigarettes  . Smokeless tobacco: Never Used  . Tobacco comment: 7 cig./day  Substance and Sexual Activity  . Alcohol use: Yes    Comment: infrequently/rare  . Drug use: No  . Sexual activity: Not on file  Lifestyle  . Physical activity:    Days per week: Not on file    Minutes per session: Not on file  . Stress: Not on file  Relationships  . Social connections:    Talks on phone: Not on file    Gets together: Not on file    Attends religious service: Not on file    Active member of club or organization: Not on file    Attends meetings of clubs or organizations: Not on file    Relationship status: Not on file  . Intimate partner violence:    Fear of current or ex partner: Not on file    Emotionally abused: Not on file    Physically  abused: Not on file    Forced sexual activity: Not on file  Other Topics Concern  . Not on file  Social History Narrative  . Not on file    Family History:   Family History  Problem Relation Age of Onset  . Hypertension Mother   . Hypertension Father   . Kidney disease Father   . Diverticulosis Father   . Diabetes Sister   . Diabetes Brother    Family Status:  Family Status  Relation Name Status  . Mother  (Not Specified)  . Father  (Not Specified)  . Sister  (Not Specified)  . Brother  (Not Specified)    ROS:  Please see the history of present illness.  All other ROS reviewed and negative.     Physical Exam/Data:   Vitals:   01/24/18 1915 01/24/18 1955 01/24/18 2301 01/25/18 0616  BP: (!) 152/92 134/87 (!) 141/87 137/89  Pulse:   87 84  Resp:   17 18  Temp:   98 F (36.7 C) 98.8 F (37.1 C)  TempSrc:   Oral Oral  SpO2:   95% 94%  Weight:    107.8 kg  Height:    5\' 4"  (1.626 m)    Intake/Output Summary (Last 24 hours) at 01/25/2018 0921 Last data filed at 01/24/2018 2300 Gross per 24 hour  Intake 125 ml  Output -  Net 125 ml   Filed Weights   01/25/18 0616  Weight: 107.8 kg   Body mass index is 40.8 kg/m.   General: Obese, well developed, well nourished, NAD Skin: Warm, dry, intact  Head: Normocephalic, atraumatic, clear, moist mucus membranes. Neck: Negative for carotid bruits. No JVD Lungs: Bilateral lower lobe crackles. No wheezes. Breathing is unlabored. Cardiovascular: RRR with S1 S2. No murmurs, rubs, gallops, or LV heave appreciated. Abdomen: Soft, non-tender, non-distended with normoactive bowel sounds. No obvious abdominal masses. MSK: Strength and tone appear normal for age. 5/5 in all extremities Extremities: No edema. No clubbing or cyanosis. DP/PT pulses 2+ bilaterally Neuro: Alert and oriented. No focal deficits.  No facial asymmetry. MAE spontaneously. Psych: Responds to questions appropriately with normal affect.     EKG:  The EKG  was personally reviewed and demonstrates: 01/24/2018 NSR with nonspecific ST-T wave abnormalities, consistent with prior tracings from 2017 Telemetry:  Telemetry was personally reviewed and demonstrates: 01/25/2018 NSR  Relevant CV Studies:  ECHO: 01/25/2018: Pending results  CATH: None  Laboratory Data:  Chemistry Recent Labs  Lab 01/24/18 1504 01/25/18 0427  NA 137 136  K 3.1* 3.5  CL 100 103  CO2 28 23  GLUCOSE 124* 109*  BUN 14 11  CREATININE 0.89 0.90  CALCIUM 9.2 8.4*  GFRNONAA >60 >60  GFRAA >60 >60  ANIONGAP 9 10    Total Protein  Date Value Ref Range Status  01/24/2018 7.5 6.5 - 8.1 g/dL Final   Albumin  Date Value Ref Range Status  01/24/2018 4.0 3.5 - 5.0 g/dL Final   AST  Date Value Ref Range Status  01/24/2018 19 15 - 41 U/L Final   ALT  Date Value Ref Range Status  01/24/2018 18 0 - 44 U/L Final   Alkaline Phosphatase  Date Value Ref Range Status  01/24/2018 84 38 - 126 U/L Final   Total Bilirubin  Date Value Ref Range Status  01/24/2018 0.5 0.3 - 1.2 mg/dL Final   Hematology Recent Labs  Lab 01/24/18 1504 01/25/18 0427  WBC 10.4 8.9  RBC 4.99 4.10  HGB 15.2* 12.5  HCT 47.2* 38.9  MCV 94.6 94.9  MCH 30.5 30.5  MCHC 32.2 32.1  RDW 13.2 13.3  PLT 436* 356   Cardiac Enzymes Recent Labs  Lab 01/25/18 0116 01/25/18 0427 01/25/18 0657  TROPONINI <0.03 <0.03 <0.03    Recent Labs  Lab 01/24/18 1509 01/24/18 1956  TROPIPOC 0.01 0.04    BNPNo results for input(s): BNP, PROBNP in the last 168 hours.  DDimer No results for input(s): DDIMER in the last 168 hours. TSH:  Lab Results  Component Value Date   TSH 39.583 (H) 01/25/2018   Lipids: Lab Results  Component Value Date   CHOL 245 (H) 01/25/2018   HDL 42 01/25/2018   LDLCALC 169 (H) 01/25/2018   TRIG 168 (H) 01/25/2018   CHOLHDL 5.8 01/25/2018   HgbA1c: Lab Results  Component Value Date   HGBA1C 5.7 (H) 02/25/2015    Radiology/Studies:  Dg Chest 2 View  Result  Date: 01/24/2018 CLINICAL DATA:  Worsening chest pain. EXAM: CHEST - 2 VIEW COMPARISON:  February 17, 2016 FINDINGS: Mild opacity in left base. The heart, hila, mediastinum, lungs, and pleura are otherwise normal. IMPRESSION: Minimal opacity in left base favored represent atelectasis. Subtle infiltrate considered less likely. Recommend short-term follow-up to ensure resolution. Electronically Signed   By: Dorise Bullion III M.D   On: 01/24/2018 16:45   Ct Angio Chest/abd/pel For Dissection W And/or W/wo  Result Date: 01/24/2018 CLINICAL DATA:  Acute onset severe central chest pain and pressure with inability to take deep breaths. EXAM: CT ANGIOGRAPHY CHEST, ABDOMEN AND PELVIS TECHNIQUE: Multidetector CT imaging through the chest, abdomen and pelvis was performed using the standard protocol during bolus administration of intravenous contrast. Multiplanar reconstructed images and MIPs were obtained and reviewed to evaluate the vascular anatomy. CONTRAST:  163mL ISOVUE-370 IOPAMIDOL (ISOVUE-370) INJECTION 76% COMPARISON:  CTA chest dated August 22, 2007. FINDINGS: CTA CHEST FINDINGS Cardiovascular: Preferential opacification of the thoracic aorta. No evidence of thoracic aortic aneurysm, intramural hematoma, or dissection. Normal heart size. No pericardial effusion. Mild thoracic aortic atherosclerosis.  No pulmonary embolism. Mediastinum/Nodes: No enlarged mediastinal, hilar, or axillary lymph nodes. Thyroid gland, trachea, and esophagus demonstrate no significant findings. Lungs/Pleura: Mild centrilobular emphysema. No focal consolidation, pleural effusion, or pneumothorax. Unchanged atelectasis/scarring in the lingula. Mild bilateral lower lobe subsegmental atelectasis. No suspicious pulmonary nodule. Musculoskeletal: No chest wall abnormality. No acute or significant osseous findings. Review of the MIP images confirms the above findings. CTA ABDOMEN AND PELVIS FINDINGS VASCULAR Aorta: Normal caliber aorta without  aneurysm, dissection, vasculitis or significant stenosis. Mild calcified and noncalcified atherosclerotic plaque. Celiac: Patent without evidence of aneurysm, dissection, vasculitis or significant stenosis. The right hepatic artery appears to arise directly off of the aorta. SMA: Patent without evidence of aneurysm, dissection, vasculitis or significant stenosis. Renals: Both renal arteries are patent without evidence of aneurysm, dissection, vasculitis, fibromuscular dysplasia or significant stenosis. IMA: Patent without evidence of aneurysm, dissection, vasculitis or significant stenosis. Inflow: Patent without evidence of aneurysm, dissection, vasculitis or significant stenosis. Veins: No obvious venous abnormality within the limitations of this arterial phase study. Review of the MIP images confirms the above findings. NON-VASCULAR Hepatobiliary: No focal liver abnormality is seen. No gallstones, gallbladder wall thickening, or biliary dilatation. Pancreas: Unremarkable. No pancreatic ductal dilatation or surrounding inflammatory changes. Spleen: Normal in size without focal abnormality. Adrenals/Urinary Tract: Adrenal glands are unremarkable. Kidneys are normal, without renal calculi, focal lesion, or hydronephrosis. Bladder is unremarkable. Stomach/Bowel: Stomach is within normal limits. Appendix is not visualized. No evidence of bowel wall thickening, distention, or inflammatory changes. There are a few colonic diverticula at the splenic flexure. Lymphatic: No enlarged abdominal or pelvic lymph nodes. Reproductive: 3.7 cm uterine fibroid.  No adnexal mass. Other: No abdominal wall hernia or abnormality. No abdominopelvic ascites. No pneumoperitoneum. Musculoskeletal: No acute or significant osseous findings. Review of the MIP images confirms the above findings. IMPRESSION: Vascular: 1. No evidence of thoracoabdominal aortic dissection, intramural hematoma, or aneurysm. 2. No pulmonary embolism. 3.  Aortic  atherosclerosis (ICD10-I70.0). Chest: 1.  No acute intrathoracic process. 2.  Emphysema (ICD10-J43.9). Abdomen and pelvis: 1.  No acute intra-abdominal process. 2. Fibroid uterus. Electronically Signed   By: Titus Dubin M.D.   On: 01/24/2018 18:14   Assessment and Plan:   1.  Chest pain with negative troponin: -Patient presented with midsternal/epigastric chest tightness with radiation to her mid neck rated an 8 out of 10 in severity. She had mild associated shortness of breath while at rest.  Her symptoms progressed throughout the day prompting her to report to the emergency department for further evaluation.   -She was last evaluated by Dr. Bettina Gavia 05/2017 after being referred by her PCP for palpitations. Patient declined further work-up with cardiac monitor and preferred to monitor at home with iPhone adapter.  Has had no recurrent symptoms since this time. -Troponin, negative x3 -EKG with nonspecific ST-T wave abnormalities, consistent with prior tracings from 2017 and no acute ischemic changes -CTA negative for dissection or PE -CXR with atelectasis consistent with physical exam -Echocardiogram with pending results -Currently denies chest pain -Continue ASA, amlodipine 10 -Will add low dose IMDUR 15mg  for possible esophageal spasm -Will await echo results for final recommendations regarding further workup. Given her negative work-up thus far with negative troponin in the setting of prolonged chest tightness and location of symptoms, likely will defer further cardiac work-up at this time.  If echocardiogram is abnormal consider OP Myoview stress test versus cardiac CT for further evaluation -Consider gentle IV hydration for now. Will plan for close follow up  with Dr. Bettina Gavia in one to two weeks for OP workup   2.  Hypertension: -Stable, 137/89, 141/87, 134/87 -Continue amlodipine 10, losartan-hydrochlorothiazide 100-25  3.  Hypothyroidism: -Uncontrolled, TSH on presentation  39.583 -Patient reports that she has not taken her Synthroid and approximately 6 weeks secondary to insurance issues and has recently started back 1 week ago -Patient encouraged to follow-up with PCP for further evaluation  4.  Hyperlipidemia: -Stable, continue atorvastatin  5.  Ongoing tobacco use: -Patient strongly encouraged  6.  GERD: -Takes daily omeprazole with moderate symptom management -Patient presented with questionable epigastric pain    For questions or updates, please contact [place] Please consult [email or web address] for contact info under Cardiology/STEMI.   SignedKathyrn Drown NP-C [facility] Pager: [phone] 01/25/2018 9:21 AM   ---------------------------------------------------------------------------------------------   History and all data above reviewed.  Patient examined.  I agree with the findings as above.  Sheri Villarreal is a pleasant 57 yo female with palpitations and hypothyroidism and GERD who we are seeing for CP. Chest pain seems atypical or noncardiac.   Constitutional: No acute distress Eyes: pupils equally round and reactive to light, sclera non-icteric, normal conjunctiva and lids.  ENMT: normal dentition, moist mucous membranes Cardiovascular: regular rhythm, normal rate, no murmurs. S1 and S2 normal. Radial pulses normal bilaterally. No jugular venous distention.  Respiratory: clear to auscultation bilaterally GI : normal bowel sounds, soft and nontender. No distention.   MSK: extremities warm, well perfused. No edema.  NEURO: grossly nonfocal exam, moves all extremities. PSYCH: alert and oriented x 3, normal mood and affect.   All available labs, radiology testing, previous records reviewed. Agree with documented assessment and plan of my colleague as stated above with the following additions or changes:  Principal Problem:   Chest pain Active Problems:   Depression   Tobacco abuse   Hypothyroidism   Essential  hypertension   Plan:  I have attempted to review the coronary arteries on her recent non ecg gated PE study, however no diagnostic coronary assessment can be made. Her HR is elevated today and may not result in a diagnostic coronary CTA today inpatient.   An echocardiogram was performed and is unremarkable. She has ruled out for MI. Her EF is hyperdynamic and she may benefit from gentle hydration.   It would be reasonable to consider outpatient coronary CTA vs. Stress if her epigastric pain continues.  We will have her f/u with Dr. Bettina Gavia in 1-2 weeks to determine if any additional tests are required.  No further inpt testing needed. Cardiology will sign off. Recommendations discussed with primary service and they are in agreement with plan.  Elouise Munroe, MD [facility] 5:06 PM  01/25/2018

## 2018-01-25 NOTE — Progress Notes (Addendum)
  Echocardiogram 2D Echocardiogram has been performed with [facility].  Sheri Villarreal 01/25/2018, 10:45 AM

## 2018-01-25 NOTE — Discharge Summary (Signed)
Physician Discharge Summary  Sheri Villarreal INO:[id number] DOB: 12/06/1960 DOA: 01/24/2018  PCP: Leighton Ruff, MD  Admit date: 01/24/2018 Discharge date: 01/25/2018  Time spent: 40 minutes  Recommendations for Outpatient Follow-up:  1. Follow up with dr Bettina Gavia 1-2 weeks for OP stress test 2. Follow up with PCP 1 week for evaluation of Free T3 and T4 3. Follow up with PCP 4 weeks for evaluation of TSH level   Discharge Diagnoses:  Principal Problem:   Chest pain Active Problems:   Hypothyroidism   Essential hypertension   Depression   Tobacco abuse   Discharge Condition: stable  Diet recommendation: heart healthy   Filed Weights   01/25/18 0616  Weight: 107.8 kg    History of present illness:  Sheri Villarreal is a 57 y.o. female with medical history significant of HTN, hyperthyroidism s/p ablation, osteoarthritis of the knees, depression, and GERD; who presented to ED 12/2 with complaints of chest pain.  Pain was located substernally and she describes it as a tightening feeling in her chest though something was sitting there with intermittent "spasm like"  Pains.  Patient reported intermittent twinges of pain that caused her to feel so she was short of breath.  Denied any nausea, vomiting, diaphoresis,lightheadedness, or loss of consciousness.  Described pain as a 8 out of 10 on the pain scale.  She reported only having coffee crackers for breakfast.  Symptoms worsened as the day went on.  She tried eating something for lunch but felt as though it was stuck in her throat and described feeling discomfort in her neck.  Associated symptoms included feeling tired.  She admited to being off of her levothyroxine due to issues with her insurance for approximately 6 weeks, but recently restarted 1 week ago.  Denied family history for heart disease.  History of acid reflux and esophageal spasm but reported being on omeprazole for treatment.  He also reported smoking less than half  pack cigarettes per day   Hospital Course:  Chest pain: Acute. Hx palpitations, hen hypothyroidism s/p ablation non-compliance  Patient presents with complaints of substernal chest tightness and heaviness.heart score 4.  Troponin neg x3,  EKG with non-specific ST-T wave abnormalities consistent with prior EKG, CTA negative for PE, chest xray with atelectasis echo with EF 55-60% with no wall motion abnormalities and grade 1 DD. No events on tele. Evaluated by cards who opine given negative workup no further symptoms with echo results as noted patient likely follow up OP. Recommended low dose imdur  Hypothyroid: Patient status post ablation for hyperthyroid symptoms currently on levothyroxine.  Patient had previously been off the medicine for the last 6 weeks but just recently restarted 1 week ago. TSH 39.  -follow up with PCP 4 weeks for TSH level -follow up with PCP 1 week for free T3 and Free T4 level - Continue Levothyroxine  Osteoarthritis of right knee - Hold Voltaren  Essential hypertension. controlled - Continue amlodipine and losartan-hydrochlorothiazide  Depression: Patient reports being on Wellbutrin or Lexapro. - Consider restarting if warranted in the outpatient setting  Hyperlipidemia - Continue atorvastatin  Tobacco abuse: Chronic.  Reports to smoking half pack cigarettes or less per day - cessation counseling offered  GERD - continue home med  Procedures:  echo  Consultations:  Matilde Bash NP cards  Discharge Exam: Vitals:   01/25/18 0616 01/25/18 1300  BP: 137/89 (!) 146/87  Pulse: 84 81  Resp: 18   Temp: 98.8 F (37.1 C) 98.3 F (36.8  C)  SpO2: 94% 98%    General: sitting on side of bed no acute distress Cardiovascular: rrr no MGR no LE edema Respiratory: normal effort BS clear bilaterally no wheeze  Discharge Instructions   Discharge Instructions    Call MD for:  persistant dizziness or light-headedness   Complete by:  As directed     Call MD for:  persistant nausea and vomiting   Complete by:  As directed    Call MD for:  severe uncontrolled pain   Complete by:  As directed    Diet - low sodium heart healthy   Complete by:  As directed    Discharge instructions   Complete by:  As directed    Take medication as prescribed Follow up with PCP 1 week for evaluation of free T3 and free T4 Follow up with PCP 4 weeks for evaluation of TSH Call cardiology for follow up 1-2 weeks for evaluation of chest pain and possible scheduling of stress test   Increase activity slowly   Complete by:  As directed       Allergies  Allergen Reactions  . Hydrocodone-Homatropine Itching    Reaction to Hycodan cough syrup (pt has taken norco in the past with no reaction)      The results of significant diagnostics from this hospitalization (including imaging, microbiology, ancillary and laboratory) are listed below for reference.    Significant Diagnostic Studies: Dg Chest 2 View  Result Date: 01/24/2018 CLINICAL DATA:  Worsening chest pain. EXAM: CHEST - 2 VIEW COMPARISON:  February 17, 2016 FINDINGS: Mild opacity in left base. The heart, hila, mediastinum, lungs, and pleura are otherwise normal. IMPRESSION: Minimal opacity in left base favored represent atelectasis. Subtle infiltrate considered less likely. Recommend short-term follow-up to ensure resolution. Electronically Signed   By: Dorise Bullion III M.D   On: 01/24/2018 16:45   Ct Angio Chest/abd/pel For Dissection W And/or W/wo  Result Date: 01/24/2018 CLINICAL DATA:  Acute onset severe central chest pain and pressure with inability to take deep breaths. EXAM: CT ANGIOGRAPHY CHEST, ABDOMEN AND PELVIS TECHNIQUE: Multidetector CT imaging through the chest, abdomen and pelvis was performed using the standard protocol during bolus administration of intravenous contrast. Multiplanar reconstructed images and MIPs were obtained and reviewed to evaluate the vascular anatomy.  CONTRAST:  172mL ISOVUE-370 IOPAMIDOL (ISOVUE-370) INJECTION 76% COMPARISON:  CTA chest dated August 22, 2007. FINDINGS: CTA CHEST FINDINGS Cardiovascular: Preferential opacification of the thoracic aorta. No evidence of thoracic aortic aneurysm, intramural hematoma, or dissection. Normal heart size. No pericardial effusion. Mild thoracic aortic atherosclerosis. No pulmonary embolism. Mediastinum/Nodes: No enlarged mediastinal, hilar, or axillary lymph nodes. Thyroid gland, trachea, and esophagus demonstrate no significant findings. Lungs/Pleura: Mild centrilobular emphysema. No focal consolidation, pleural effusion, or pneumothorax. Unchanged atelectasis/scarring in the lingula. Mild bilateral lower lobe subsegmental atelectasis. No suspicious pulmonary nodule. Musculoskeletal: No chest wall abnormality. No acute or significant osseous findings. Review of the MIP images confirms the above findings. CTA ABDOMEN AND PELVIS FINDINGS VASCULAR Aorta: Normal caliber aorta without aneurysm, dissection, vasculitis or significant stenosis. Mild calcified and noncalcified atherosclerotic plaque. Celiac: Patent without evidence of aneurysm, dissection, vasculitis or significant stenosis. The right hepatic artery appears to arise directly off of the aorta. SMA: Patent without evidence of aneurysm, dissection, vasculitis or significant stenosis. Renals: Both renal arteries are patent without evidence of aneurysm, dissection, vasculitis, fibromuscular dysplasia or significant stenosis. IMA: Patent without evidence of aneurysm, dissection, vasculitis or significant stenosis. Inflow: Patent without evidence of aneurysm, dissection,  vasculitis or significant stenosis. Veins: No obvious venous abnormality within the limitations of this arterial phase study. Review of the MIP images confirms the above findings. NON-VASCULAR Hepatobiliary: No focal liver abnormality is seen. No gallstones, gallbladder wall thickening, or biliary  dilatation. Pancreas: Unremarkable. No pancreatic ductal dilatation or surrounding inflammatory changes. Spleen: Normal in size without focal abnormality. Adrenals/Urinary Tract: Adrenal glands are unremarkable. Kidneys are normal, without renal calculi, focal lesion, or hydronephrosis. Bladder is unremarkable. Stomach/Bowel: Stomach is within normal limits. Appendix is not visualized. No evidence of bowel wall thickening, distention, or inflammatory changes. There are a few colonic diverticula at the splenic flexure. Lymphatic: No enlarged abdominal or pelvic lymph nodes. Reproductive: 3.7 cm uterine fibroid.  No adnexal mass. Other: No abdominal wall hernia or abnormality. No abdominopelvic ascites. No pneumoperitoneum. Musculoskeletal: No acute or significant osseous findings. Review of the MIP images confirms the above findings. IMPRESSION: Vascular: 1. No evidence of thoracoabdominal aortic dissection, intramural hematoma, or aneurysm. 2. No pulmonary embolism. 3.  Aortic atherosclerosis (ICD10-I70.0). Chest: 1.  No acute intrathoracic process. 2.  Emphysema (ICD10-J43.9). Abdomen and pelvis: 1.  No acute intra-abdominal process. 2. Fibroid uterus. Electronically Signed   By: Titus Dubin M.D.   On: 01/24/2018 18:14    Microbiology: No results found for this or any previous visit (from the past 240 hour(s)).   Labs: Basic Metabolic Panel: Recent Labs  Lab 01/24/18 1504 01/25/18 0427  NA 137 136  K 3.1* 3.5  CL 100 103  CO2 28 23  GLUCOSE 124* 109*  BUN 14 11  CREATININE 0.89 0.90  CALCIUM 9.2 8.4*  MG  --  2.2   Liver Function Tests: Recent Labs  Lab 01/24/18 1504  AST 19  ALT 18  ALKPHOS 84  BILITOT 0.5  PROT 7.5  ALBUMIN 4.0   Recent Labs  Lab 01/24/18 1504  LIPASE 43   No results for input(s): AMMONIA in the last 168 hours. CBC: Recent Labs  Lab 01/24/18 1504 01/25/18 0427  WBC 10.4 8.9  NEUTROABS  --  5.4  HGB 15.2* 12.5  HCT 47.2* 38.9  MCV 94.6 94.9  PLT  436* 356   Cardiac Enzymes: Recent Labs  Lab 01/25/18 0116 01/25/18 0427 01/25/18 0657  TROPONINI <0.03 <0.03 <0.03   BNP: BNP (last 3 results) No results for input(s): BNP in the last 8760 hours.  ProBNP (last 3 results) No results for input(s): PROBNP in the last 8760 hours.  CBG: No results for input(s): GLUCAP in the last 168 hours.     Signed:  Radene Gunning NP.  [facility] 01/25/2018, 3:12 PM

## 2018-02-04 NOTE — Progress Notes (Signed)
Cardiology Office Note:    Date:  02/07/2018   ID:  Sheri Villarreal, DOB October 07, 1960, MRN [id number]  PCP:  Leighton Ruff, MD  Cardiologist:  Shirlee More, MD    Referring MD: Leighton Ruff, MD    ASSESSMENT:    1. Chest pain, unspecified type   2. Essential hypertension   3. Gastroesophageal reflux disease without esophagitis    PLAN:    In order of problems listed above:  1. Symptoms are best described as atypical angina but there are certain elements that make me think of a cardiac ischemic etiology she holds a clenched fist to her chest when she describes a sensation in the worsening with effort her radiation to the throat seem much more typical of cardiac and esophageal pain.  I respect her wishes await to hear from her after she goes to see gastroenterology and if she decides to have an ischemia evaluation I will go ahead and schedule at the first opportunity. 2. Poorly controlled she will refill her prescription for calcium channel blocker 3. She is making arrangements seen by GI send a copy of my note to them   Next appointment: 3 months or sooner   Medication Adjustments/Labs and Tests Ordered: Current medicines are reviewed at length with the patient today.  Concerns regarding medicines are outlined above.  No orders of the defined types were placed in this encounter.  No orders of the defined types were placed in this encounter.   Chief Complaint  Patient presents with  . Follow-up    after admission with chest pain.    History of Present Illness:    Sheri Villarreal is a 57 y.o. female with a hx of palpitation last seen by me 05/24/2017.  She was admitted to [facility] 01/24/2018 with chest pain.  CTA in the emergency room showed no aortopathy coronary artery calcification  or evidence of venous thromboembolism cardiac enzymes were normal.  She also had a cardiac echo performed showed EF 55 to 60% her no ischemic segmental left  ventricular areas of dysfunction.  She did not undergo an ischemia evaluation as an inpatient. Compliance with diet, lifestyle and medications: Yes  When seen today reviewed her history when she went to the hospital she had hours of nagging substernal epigastric aching with physical effort is a little worse and radiate up into her jaw with rest feels a little better and finally over time at the hospital after being given morphine she found relief.  Although her troponins were normal she had ST depression on her EKGs.  Since then she has had no recurrence she is under intense stress with her parents her mother fell and has a fractured her femur and being involved with the care of a niece who is dependent on her she is not having exertional chest pain dyspnea palpitation or syncope and does have a history of GERD and has had some discomfort with swallowing despite taking a PPI.  I spent greater than 25 minutes reviewing her history counseling the patient advised her she is undergone ischemia evaluation we went over the options of traditional stress modalities with and without nuclear isotope or cardiac CTA.  At this time she made a decision to defer wants to go and see her GI physician and thinks she needs endoscopy first I told her she may be correct and send a copy of this to gastroenterology and if she decides that she wants to do her perfusion study her cardiac CTA  would be happy to order for.  Her blood pressure is elevated today and she needs a refill a prescription for Norvasc.  She is not having exertional chest pain since she left the hospital. Past Medical History:  Diagnosis Date  . Acute bronchitis 02/18/2016  . Arthritis    knees  . Carpal tunnel syndrome of right wrist 06/2013  . Chest pain 02/24/2015  . Dental crowns present   . Depression   . Essential hypertension   . GERD (gastroesophageal reflux disease)   . GOITER, MULTINODULAR 09/05/2007   Qualifier: Diagnosis of  By: Loanne Drilling MD,  Jacelyn Pi   . History of hyperthyroidism   . Hypertension    under control with med., has been on med. x 9 yr.  . Hypertensive urgency    under control with med., has been on med. x 9 yr.   . HYPERTHYROIDISM 09/05/2007   Qualifier: Diagnosis of  By: Loanne Drilling MD, Jacelyn Pi   . Hypothyroidism 02/24/2015  . Pain in the chest   . Palpitations 05/19/2017  . Thyroid disease   . Tobacco abuse   . Weakness 02/18/2016    Past Surgical History:  Procedure Laterality Date  . APPENDECTOMY    . CARPAL TUNNEL RELEASE Right 07/04/2013   Procedure: RIGHT CARPAL TUNNEL RELEASE;  Surgeon: Wynonia Sours, MD;  Location: [place];  Service: Orthopedics;  Laterality: Right;  . HERNIA REPAIR    . INGUINAL HERNIA REPAIR    . KNEE ARTHROSCOPY Right     Current Medications: Current Meds  Medication Sig  . acetaminophen (TYLENOL) 500 MG tablet Take 1,000 mg by mouth as needed for mild pain.   Marland Kitchen amLODipine (NORVASC) 10 MG tablet Take 1 tablet (10 mg total) by mouth daily.  . diclofenac (VOLTAREN) 75 MG EC tablet Take 75 mg by mouth daily.   Marland Kitchen levothyroxine (SYNTHROID, LEVOTHROID) 175 MCG tablet Take 175 mcg by mouth daily before breakfast.   . losartan-hydrochlorothiazide (HYZAAR) 100-25 MG per tablet Take 1 tablet by mouth daily.  . Magnesium 500 MG TABS Take 500 mg by mouth daily.   Marland Kitchen omeprazole (PRILOSEC) 20 MG capsule Take 20 mg by mouth daily before lunch.      Allergies:   Hydrocodone-homatropine and Lipitor [atorvastatin]   Social History   Socioeconomic History  . Marital status: Single    Spouse name: Not on file  . Number of children: Not on file  . Years of education: Not on file  . Highest education level: Not on file  Occupational History  . Not on file  Social Needs  . Financial resource strain: Not on file  . Food insecurity:    Worry: Not on file    Inability: Not on file  . Transportation needs:    Medical: Not on file    Non-medical: Not on file  Tobacco Use  .  Smoking status: Current Every Day Smoker    Packs/day: 0.50    Years: 30.00    Pack years: 15.00    Types: Cigarettes  . Smokeless tobacco: Never Used  . Tobacco comment: 7 cig./day  Substance and Sexual Activity  . Alcohol use: Yes    Comment: infrequently/rare  . Drug use: No  . Sexual activity: Not on file  Lifestyle  . Physical activity:    Days per week: Not on file    Minutes per session: Not on file  . Stress: Not on file  Relationships  . Social connections:  Talks on phone: Not on file    Gets together: Not on file    Attends religious service: Not on file    Active member of club or organization: Not on file    Attends meetings of clubs or organizations: Not on file    Relationship status: Not on file  Other Topics Concern  . Not on file  Social History Narrative  . Not on file     Family History: The patient's family history includes Diabetes in her brother and sister; Diverticulosis in her father; Hypertension in her father and mother; Kidney disease in her father. ROS:   Please see the history of present illness.    All other systems reviewed and are negative.  EKGs/Labs/Other Studies Reviewed:    The following studies were reviewed today:  EKG:  [facility] nonspecific ST abnormality  Recent Labs: 01/24/2018: ALT 18 01/25/2018: BUN 11; Creatinine, Ser 0.90; Hemoglobin 12.5; Magnesium 2.2; Platelets 356; Potassium 3.5; Sodium 136; TSH 39.583  Recent Lipid Panel    Component Value Date/Time   CHOL 245 (H) 01/25/2018 0427   TRIG 168 (H) 01/25/2018 0427   HDL 42 01/25/2018 0427   CHOLHDL 5.8 01/25/2018 0427   VLDL 34 01/25/2018 0427   LDLCALC 169 (H) 01/25/2018 0427    Physical Exam:    VS:  BP (!) 176/108 (BP Location: Left Arm, Patient Position: Sitting, Cuff Size: Large)   Pulse 84   Ht 5\' 4"  (1.626 m)   Wt 234 lb 6.4 oz (106.3 kg)   SpO2 96%   BMI 40.23 kg/m     Wt Readings from Last 3 Encounters:  02/07/18 234 lb 6.4 oz (106.3 kg)  01/25/18  237 lb 11.2 oz (107.8 kg)  07/02/17 230 lb (104.3 kg)     GEN:  Well nourished, well developed in no acute distress HEENT: Normal NECK: No JVD; No carotid bruits LYMPHATICS: No lymphadenopathy CARDIAC: RRR, no murmurs, rubs, gallops RESPIRATORY:  Clear to auscultation without rales, wheezing or rhonchi  ABDOMEN: Soft, non-tender, non-distended MUSCULOSKELETAL:  No edema; No deformity  SKIN: Warm and dry NEUROLOGIC:  Alert and oriented x 3 PSYCHIATRIC:  Normal affect    Signed, Shirlee More, MD  02/07/2018 9:50 AM    [place]

## 2018-02-07 ENCOUNTER — Ambulatory Visit: Payer: 59 | Admitting: Cardiology

## 2018-02-07 ENCOUNTER — Encounter: Payer: Self-pay | Admitting: Cardiology

## 2018-02-07 VITALS — BP 176/108 | HR 84 | Ht 64.0 in | Wt 234.4 lb

## 2018-02-07 DIAGNOSIS — K219 Gastro-esophageal reflux disease without esophagitis: Secondary | ICD-10-CM

## 2018-02-07 DIAGNOSIS — R079 Chest pain, unspecified: Secondary | ICD-10-CM

## 2018-02-07 DIAGNOSIS — I10 Essential (primary) hypertension: Secondary | ICD-10-CM | POA: Diagnosis not present

## 2018-02-07 NOTE — Patient Instructions (Addendum)
Medication Instructions:  Your physician recommends that you continue on your current medications as directed. Please refer to the Current Medication list given to you today.  If you need a refill on your cardiac medications before your next appointment, please call your pharmacy.   Lab work: NONE If you have labs (blood work) drawn today and your tests are completely normal, you will receive your results only by: Marland Kitchen MyChart Message (if you have MyChart) OR . A paper copy in the mail If you have any lab test that is abnormal or we need to change your treatment, we will call you to review the results.  Testing/Procedures: NONE  Follow-Up: At [facility], you and your health needs are our priority.  As part of our continuing mission to provide you with exceptional heart care, we have created designated Provider Care Teams.  These Care Teams include your primary Cardiologist (physician) and Advanced Practice Providers (APPs -  Physician Assistants and Nurse Practitioners) who all work together to provide you with the care you need, when you need it. You will need a follow up appointment in 3 months.  Please call our office 2 months in advance to schedule this appointment.     Hypertension Hypertension, commonly called high blood pressure, is when the force of blood pumping through the arteries is too strong. The arteries are the blood vessels that carry blood from the heart throughout the body. Hypertension forces the heart to work harder to pump blood and may cause arteries to become narrow or stiff. Having untreated or uncontrolled hypertension can cause heart attacks, strokes, kidney disease, and other problems. A blood pressure reading consists of a higher number over a lower number. Ideally, your blood pressure should be below 120/80. The first ("top") number is called the systolic pressure. It is a measure of the pressure in your arteries as your heart beats. The second ("bottom") number  is called the diastolic pressure. It is a measure of the pressure in your arteries as the heart relaxes. What are the causes? The cause of this condition is not known. What increases the risk? Some risk factors for high blood pressure are under your control. Others are not. Factors you can change  Smoking.  Having type 2 diabetes mellitus, high cholesterol, or both.  Not getting enough exercise or physical activity.  Being overweight.  Having too much fat, sugar, calories, or salt (sodium) in your diet.  Drinking too much alcohol. Factors that are difficult or impossible to change  Having chronic kidney disease.  Having a family history of high blood pressure.  Age. Risk increases with age.  Race. You may be at higher risk if you are African-American.  Gender. Men are at higher risk than women before age 61. After age 94, women are at higher risk than men.  Having obstructive sleep apnea.  Stress. What are the signs or symptoms? Extremely high blood pressure (hypertensive crisis) may cause:  Headache.  Anxiety.  Shortness of breath.  Nosebleed.  Nausea and vomiting.  Severe chest pain.  Jerky movements you cannot control (seizures).  How is this diagnosed? This condition is diagnosed by measuring your blood pressure while you are seated, with your arm resting on a surface. The cuff of the blood pressure monitor will be placed directly against the skin of your upper arm at the level of your heart. It should be measured at least twice using the same arm. Certain conditions can cause a difference in blood pressure between  your right and left arms. Certain factors can cause blood pressure readings to be lower or higher than normal (elevated) for a short period of time:  When your blood pressure is higher when you are in a health care provider's office than when you are at home, this is called white coat hypertension. Most people with this condition do not need  medicines.  When your blood pressure is higher at home than when you are in a health care provider's office, this is called masked hypertension. Most people with this condition may need medicines to control blood pressure.  If you have a high blood pressure reading during one visit or you have normal blood pressure with other risk factors:  You may be asked to return on a different day to have your blood pressure checked again.  You may be asked to monitor your blood pressure at home for 1 week or longer.  If you are diagnosed with hypertension, you may have other blood or imaging tests to help your health care provider understand your overall risk for other conditions. How is this treated? This condition is treated by making healthy lifestyle changes, such as eating healthy foods, exercising more, and reducing your alcohol intake. Your health care provider may prescribe medicine if lifestyle changes are not enough to get your blood pressure under control, and if:  Your systolic blood pressure is above 130.  Your diastolic blood pressure is above 80.  Your personal target blood pressure may vary depending on your medical conditions, your age, and other factors. Follow these instructions at home: Eating and drinking  Eat a diet that is high in fiber and potassium, and low in sodium, added sugar, and fat. An example eating plan is called the DASH (Dietary Approaches to Stop Hypertension) diet. To eat this way: ? Eat plenty of fresh fruits and vegetables. Try to fill half of your plate at each meal with fruits and vegetables. ? Eat whole grains, such as whole wheat pasta, brown rice, or whole grain bread. Fill about one quarter of your plate with whole grains. ? Eat or drink low-fat dairy products, such as skim milk or low-fat yogurt. ? Avoid fatty cuts of meat, processed or cured meats, and poultry with skin. Fill about one quarter of your plate with lean proteins, such as fish, chicken  without skin, beans, eggs, and tofu. ? Avoid premade and processed foods. These tend to be higher in sodium, added sugar, and fat.  Reduce your daily sodium intake. Most people with hypertension should eat less than 1,500 mg of sodium a day.  Limit alcohol intake to no more than 1 drink a day for nonpregnant women and 2 drinks a day for men. One drink equals 12 oz of beer, 5 oz of wine, or 1 oz of hard liquor. Lifestyle  Work with your health care provider to maintain a healthy body weight or to lose weight. Ask what an ideal weight is for you.  Get at least 30 minutes of exercise that causes your heart to beat faster (aerobic exercise) most days of the week. Activities may include walking, swimming, or biking.  Include exercise to strengthen your muscles (resistance exercise), such as pilates or lifting weights, as part of your weekly exercise routine. Try to do these types of exercises for 30 minutes at least 3 days a week.  Do not use any products that contain nicotine or tobacco, such as cigarettes and e-cigarettes. If you need help quitting, ask your health  care provider.  Monitor your blood pressure at home as told by your health care provider.  Keep all follow-up visits as told by your health care provider. This is important. Medicines  Take over-the-counter and prescription medicines only as told by your health care provider. Follow directions carefully. Blood pressure medicines must be taken as prescribed.  Do not skip doses of blood pressure medicine. Doing this puts you at risk for problems and can make the medicine less effective.  Ask your health care provider about side effects or reactions to medicines that you should watch for. Contact a health care provider if:  You think you are having a reaction to a medicine you are taking.  You have headaches that keep coming back (recurring).  You feel dizzy.  You have swelling in your ankles.  You have trouble with your  vision. Get help right away if:  You develop a severe headache or confusion.  You have unusual weakness or numbness.  You feel faint.  You have severe pain in your chest or abdomen.  You vomit repeatedly.  You have trouble breathing. Summary  Hypertension is when the force of blood pumping through your arteries is too strong. If this condition is not controlled, it may put you at risk for serious complications.  Your personal target blood pressure may vary depending on your medical conditions, your age, and other factors. For most people, a normal blood pressure is less than 120/80.  Hypertension is treated with lifestyle changes, medicines, or a combination of both. Lifestyle changes include weight loss, eating a healthy, low-sodium diet, exercising more, and limiting alcohol. This information is not intended to replace advice given to you by your health care provider. Make sure you discuss any questions you have with your health care provider. Document Released: 02/09/2005 Document Revised: 01/08/2016 Document Reviewed: 01/08/2016 Elsevier Interactive Patient Education  [employer].

## 2018-02-14 LAB — HIV ANTIBODY (ROUTINE TESTING W REFLEX): HIV Screen 4th Generation wRfx: NONREACTIVE

## 2018-05-09 ENCOUNTER — Ambulatory Visit: Payer: 59 | Admitting: Cardiology

## 2018-05-10 ENCOUNTER — Ambulatory Visit: Payer: 59 | Admitting: Cardiology

## 2019-02-21 ENCOUNTER — Encounter (HOSPITAL_COMMUNITY): Payer: Self-pay | Admitting: Emergency Medicine

## 2019-02-21 ENCOUNTER — Emergency Department (HOSPITAL_COMMUNITY): Payer: 59

## 2019-02-21 ENCOUNTER — Emergency Department (HOSPITAL_COMMUNITY)
Admission: EM | Admit: 2019-02-21 | Discharge: 2019-02-22 | Disposition: A | Discharge status: Home / Self Care | Payer: 59 | Attending: Emergency Medicine | Admitting: Emergency Medicine

## 2019-02-21 ENCOUNTER — Other Ambulatory Visit: Payer: Self-pay

## 2019-02-21 DIAGNOSIS — E039 Hypothyroidism, unspecified: Secondary | ICD-10-CM | POA: Diagnosis not present

## 2019-02-21 DIAGNOSIS — G510 Bell's palsy: Secondary | ICD-10-CM | POA: Diagnosis not present

## 2019-02-21 DIAGNOSIS — I10 Essential (primary) hypertension: Secondary | ICD-10-CM | POA: Insufficient documentation

## 2019-02-21 DIAGNOSIS — R29810 Facial weakness: Secondary | ICD-10-CM | POA: Diagnosis present

## 2019-02-21 DIAGNOSIS — Z79899 Other long term (current) drug therapy: Secondary | ICD-10-CM | POA: Diagnosis not present

## 2019-02-21 DIAGNOSIS — F17210 Nicotine dependence, cigarettes, uncomplicated: Secondary | ICD-10-CM | POA: Insufficient documentation

## 2019-02-21 LAB — I-STAT CHEM 8, ED
BUN: 13 mg/dL (ref 6–20)
Calcium, Ion: 1.08 mmol/L — ABNORMAL LOW (ref 1.15–1.40)
Chloride: 100 mmol/L (ref 98–111)
Creatinine, Ser: 0.7 mg/dL (ref 0.44–1.00)
Glucose, Bld: 83 mg/dL (ref 70–99)
HCT: 46 % (ref 36.0–46.0)
Hemoglobin: 15.6 g/dL — ABNORMAL HIGH (ref 12.0–15.0)
Potassium: 3.3 mmol/L — ABNORMAL LOW (ref 3.5–5.1)
Sodium: 135 mmol/L (ref 135–145)
TCO2: 26 mmol/L (ref 22–32)

## 2019-02-21 LAB — COMPREHENSIVE METABOLIC PANEL
ALT: 21 U/L (ref 0–44)
AST: 20 U/L (ref 15–41)
Albumin: 4 g/dL (ref 3.5–5.0)
Alkaline Phosphatase: 75 U/L (ref 38–126)
Anion gap: 11 (ref 5–15)
BUN: 13 mg/dL (ref 6–20)
CO2: 24 mmol/L (ref 22–32)
Calcium: 8.9 mg/dL (ref 8.9–10.3)
Chloride: 101 mmol/L (ref 98–111)
Creatinine, Ser: 0.74 mg/dL (ref 0.44–1.00)
GFR calc Af Amer: >60 mL/min (ref >60)
GFR calc non Af Amer: >60 mL/min (ref >60)
Glucose, Bld: 89 mg/dL (ref 70–99)
Potassium: 3.4 mmol/L — ABNORMAL LOW (ref 3.5–5.1)
Sodium: 136 mmol/L (ref 135–145)
Total Bilirubin: 0.7 mg/dL (ref 0.3–1.2)
Total Protein: 7.5 g/dL (ref 6.5–8.1)

## 2019-02-21 LAB — URINALYSIS, ROUTINE W REFLEX MICROSCOPIC
Bilirubin Urine: NEGATIVE
Glucose, UA: NEGATIVE mg/dL
Hgb urine dipstick: NEGATIVE
Ketones, ur: NEGATIVE mg/dL
Leukocytes,Ua: NEGATIVE
Nitrite: NEGATIVE
Protein, ur: NEGATIVE mg/dL
Specific Gravity, Urine: 1.006 (ref 1.005–1.030)
pH: 6 (ref 5.0–8.0)

## 2019-02-21 LAB — RAPID URINE DRUG SCREEN, HOSP PERFORMED
Amphetamines: NOT DETECTED
Barbiturates: NOT DETECTED
Benzodiazepines: NOT DETECTED
Cocaine: NOT DETECTED
Opiates: NOT DETECTED
Tetrahydrocannabinol: NOT DETECTED

## 2019-02-21 LAB — CBC
HCT: 46.1 % — ABNORMAL HIGH (ref 36.0–46.0)
Hemoglobin: 15.6 g/dL — ABNORMAL HIGH (ref 12.0–15.0)
MCH: 32.3 pg (ref 26.0–34.0)
MCHC: 33.8 g/dL (ref 30.0–36.0)
MCV: 95.4 fL (ref 80.0–100.0)
Platelets: 419 10*3/uL — ABNORMAL HIGH (ref 150–400)
RBC: 4.83 MIL/uL (ref 3.87–5.11)
RDW: 12.8 % (ref 11.5–15.5)
WBC: 10.3 10*3/uL (ref 4.0–10.5)
nRBC: 0 % (ref 0.0–0.2)

## 2019-02-21 LAB — DIFFERENTIAL
Abs Immature Granulocytes: 0.03 10*3/uL (ref 0.00–0.07)
Basophils Absolute: 0.1 10*3/uL (ref 0.0–0.1)
Basophils Relative: 1 %
Eosinophils Absolute: 0.2 10*3/uL (ref 0.0–0.5)
Eosinophils Relative: 2 %
Immature Granulocytes: 0 %
Lymphocytes Relative: 30 %
Lymphs Abs: 3.1 10*3/uL (ref 0.7–4.0)
Monocytes Absolute: 0.6 10*3/uL (ref 0.1–1.0)
Monocytes Relative: 5 %
Neutro Abs: 6.3 10*3/uL (ref 1.7–7.7)
Neutrophils Relative %: 62 %

## 2019-02-21 LAB — I-STAT BETA HCG BLOOD, ED (MC, WL, AP ONLY): I-stat hCG, quantitative: 7.8 m[IU]/mL — ABNORMAL HIGH (ref <5)

## 2019-02-21 LAB — PROTIME-INR
INR: 0.9 (ref 0.8–1.2)
Prothrombin Time: 12.4 seconds (ref 11.4–15.2)

## 2019-02-21 LAB — CBG MONITORING, ED: Glucose-Capillary: 88 mg/dL (ref 70–99)

## 2019-02-21 LAB — ETHANOL: Alcohol, Ethyl (B): <10 mg/dL (ref <10)

## 2019-02-21 LAB — APTT: aPTT: 30 seconds (ref 24–36)

## 2019-02-21 MED ORDER — VALACYCLOVIR HCL 1 G PO TABS: 1000.0000 mg | ORAL_TABLET | Freq: Three times a day (TID) | ORAL | 0 refills | Status: DC

## 2019-02-21 MED ORDER — VALACYCLOVIR HCL 500 MG PO TABS
1000.0000 mg | ORAL_TABLET | Freq: Once | ORAL | Status: AC
  Administered 2019-02-21: 1000 mg via ORAL
  Filled 2019-02-21 (×2): qty 2

## 2019-02-21 MED ORDER — PREDNISONE 20 MG PO TABS: 60.0000 mg | ORAL_TABLET | Freq: Every day | ORAL | 0 refills | Status: DC

## 2019-02-21 MED ORDER — PREDNISONE 20 MG PO TABS
60.0000 mg | ORAL_TABLET | Freq: Once | ORAL | Status: AC
  Administered 2019-02-21: 60 mg via ORAL
  Filled 2019-02-21: qty 3

## 2019-02-21 NOTE — ED Notes (Signed)
Sent urine culture with urine specimen ?

## 2019-02-21 NOTE — Consult Note (Signed)
NEURO HOSPITALIST CONSULT NOTE   Requestig physician: Dr. Wyvonnia Dusky  Reason for Consult: New onset of left facial weakness  History obtained from:   Patient and Chart    HPI:                                                                                                                                          Sheri Villarreal is an 58 y.o. female presenting to the ED with new onset of left facial weakness. She first noticed the symptoms at about 3:30 PM when she was with a friend starting to drink a beverage - she noticed that she could not pull liquid into her mouth normally with a straw, then noticed that it was because the left side of her mouth was weak. She could not close her left eye tightly. She had some tongue numbness, which was new. She also felt as though she had decreased sensation on the left side of her face. Mild left mastoid pain was noticed at that time. She had no limb weakness, aphasia, neck pain, vision loss, limb numbness or ataxia. Also with no CP, SOB or N/V.   Past Medical History:  Diagnosis Date  . Acute bronchitis 02/18/2016  . Arthritis    knees  . Carpal tunnel syndrome of right wrist 06/2013  . Chest pain 02/24/2015  . Dental crowns present   . Depression   . Essential hypertension   . GERD (gastroesophageal reflux disease)   . GOITER, MULTINODULAR 09/05/2007   Qualifier: Diagnosis of  By: Loanne Drilling MD, Jacelyn Pi   . History of hyperthyroidism   . Hypertension    under control with med., has been on med. x 9 yr.  . Hypertensive urgency    under control with med., has been on med. x 9 yr.   . HYPERTHYROIDISM 09/05/2007   Qualifier: Diagnosis of  By: Loanne Drilling MD, Jacelyn Pi   . Hypothyroidism 02/24/2015  . Pain in the chest   . Palpitations 05/19/2017  . Thyroid disease   . Tobacco abuse   . Weakness 02/18/2016    Past Surgical History:  Procedure Laterality Date  . APPENDECTOMY    . CARPAL TUNNEL RELEASE Right 07/04/2013   Procedure: RIGHT  CARPAL TUNNEL RELEASE;  Surgeon: Wynonia Sours, MD;  Location: [place];  Service: Orthopedics;  Laterality: Right;  . HERNIA REPAIR    . INGUINAL HERNIA REPAIR    . KNEE ARTHROSCOPY Right     Family History  Problem Relation Age of Onset  . Hypertension Mother   . Hypertension Father   . Kidney disease Father   . Diverticulosis Father   . Diabetes Sister   . Diabetes Brother  Social History:  reports that she has been smoking cigarettes. She has a 15.00 pack-year smoking history. She has never used smokeless tobacco. She reports current alcohol use. She reports that she does not use drugs.  Allergies  Allergen Reactions  . Hydrocodone-Homatropine Itching    Reaction to Hycodan cough syrup (pt has taken norco in the past with no reaction)  . Lipitor [Atorvastatin] Other (See Comments)    Leg pain    MEDICATIONS:                                                                                                                      No current facility-administered medications on file prior to encounter.   Current Outpatient Medications on File Prior to Encounter  Medication Sig Dispense Refill  . acetaminophen (TYLENOL) 500 MG tablet Take 1,000 mg by mouth as needed for mild pain.     Marland Kitchen amLODipine (NORVASC) 10 MG tablet Take 1 tablet (10 mg total) by mouth daily. 30 tablet 0  . diclofenac (VOLTAREN) 75 MG EC tablet Take 75 mg by mouth daily.   5  . levothyroxine (SYNTHROID, LEVOTHROID) 175 MCG tablet Take 175 mcg by mouth daily before breakfast.     . losartan-hydrochlorothiazide (HYZAAR) 100-25 MG per tablet Take 1 tablet by mouth daily.    . Magnesium 500 MG TABS Take 500 mg by mouth daily.     Marland Kitchen omeprazole (PRILOSEC) 20 MG capsule Take 20 mg by mouth daily before lunch.       ROS:                                                                                                                                       As per HPI. Comprehensive ROS  otherwise negative.   Blood pressure (!) 126/106, pulse 78, temperature (!) 97.1 F (36.2 C), temperature source Oral, resp. rate (!) 21, SpO2 98 %.   General Examination:  Physical Exam  HEENT-  [place]/AT    Lungs- Respirations unlabored Extremities- No edema  Neurological Examination Mental Status: Alert, oriented, thought content appropriate.  Speech fluent without evidence of aphasia.  Able to follow all commands without difficulty. No dysarthria.  Cranial Nerves: II: Visual fields intact with no extinction to DSS. PERRL.  III,IV, VI: EOMI without nystagmus.  V,VII: Temp sensation equal bilaterally. Left eyelid closure weaker than on the right. Left facial droop in lower quadrant. Some left forehead sparing.  VIII: Hearing intact to conversation IX,X: Palate rises symmetrically XI: Symmetric shoulder shrug XII: Midline tongue extension Motor: Right : Upper extremity   5/5    Left:     Upper extremity   5/5  Lower extremity   5/5     Lower extremity   5/5 No pronator drift.  Normal orbiting fingers test.  Sensory: Temp and light touch sensation intact in all 4 extremities. No extinction.  Deep Tendon Reflexes: 2+ and symmetric throughout Cerebellar: No ataxia with FNF bilaterally Gait: Deferred   Lab Results: Basic Metabolic Panel: Recent Labs  Lab 02/21/19 1943 02/21/19 2002  NA 136 135  K 3.4* 3.3*  CL 101 100  CO2 24  --   GLUCOSE 89 83  BUN 13 13  CREATININE 0.74 0.70  CALCIUM 8.9  --     CBC: Recent Labs  Lab 02/21/19 1943 02/21/19 2002  WBC 10.3  --   NEUTROABS 6.3  --   HGB 15.6* 15.6*  HCT 46.1* 46.0  MCV 95.4  --   PLT 419*  --     Cardiac Enzymes: No results for input(s): CKTOTAL, CKMB, CKMBINDEX, TROPONINI in the last 168 hours.  Lipid Panel: No results for input(s): CHOL, TRIG, HDL, CHOLHDL, VLDL, LDLCALC in the last 168  hours.  Imaging: CT HEAD CODE STROKE WO CONTRAST  Result Date: 02/21/2019 CLINICAL DATA:  Code stroke. Code stroke presentation. Specific defect not described. EXAM: CT HEAD WITHOUT CONTRAST TECHNIQUE: Contiguous axial images were obtained from the base of the skull through the vertex without intravenous contrast. COMPARISON:  11/16/2016 FINDINGS: Brain: Normal appearance without evidence of old or acute infarction, mass lesion, hemorrhage, hydrocephalus or extra-axial collection. Vascular: No abnormal vascular finding. Skull: Normal Sinuses/Orbits: Clear/normal Other: None ASPECTS ([place] Stroke Program Early CT Score). Applicable to either side, though lateralizing symptoms were not provided. - Ganglionic level infarction (caudate, lentiform nuclei, internal capsule, insula, M1-M3 cortex): 7 - Supraganglionic infarction (M4-M6 cortex): 3 Total score (0-10 with 10 being normal): 10 IMPRESSION: 1. Normal head CT 2. ASPECTS is 10, though specific symptoms were not provided. 3. These results were communicated to Dr. Rory Percy at 8:00 pmon 12/29/2020by text page via the [facility] messaging system. Electronically Signed   By: Nelson Chimes M.D.   On: 02/21/2019 20:02    Assessment: 58 year old female presenting with left facial weakness 1. Exam findings are most consistent with a left sided Bell's palsy. No ataxia, limb weakness, vision loss or sensory changes noted.  2. CT head normal.   Recommendations: 1. MRI brain with and without contrast with thin slices through the cerebellopontine angles to assess for possible left facial nerve enhancement as well as to rule out small brainstem stroke.  2. 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     Electronically signed: Dr. Randall Hiss  Kadelyn Dimascio 02/21/2019, 10:06  PM

## 2019-02-21 NOTE — Discharge Instructions (Signed)
Your testing shows no evidence of stroke.  Take the steroids and antiviral medication as prescribed and follow-up with your doctor and the neurologist.  Return to the ED with new or worsening symptoms.

## 2019-02-21 NOTE — ED Notes (Signed)
[place] pts friend would like an update on pt when available. She brought the pt in and is listed on contact as well

## 2019-02-21 NOTE — ED Triage Notes (Signed)
Patient reports sudden onset left facial droop , left mouth numbness / left eye weakness ( unable to close tightly) onset 3;30 pm today , evaluated by Dr. Wyvonnia Dusky at arrival and cleared to go to CT scan . Code Stroke initiated .

## 2019-02-21 NOTE — ED Provider Notes (Signed)
[facility] EMERGENCY DEPARTMENT Provider Note   CSN: [id number] Arrival date & time: 02/21/19  1921     History Chief Complaint  Patient presents with  . Code Stroke    Left Facial Droop    Sheri Villarreal is a 58 y.o. female.  Patient with a history of hypertension and obesity here with left-sided facial droop, numbness and difficulty closing her left eye.  She noticed the symptoms about 3:30 PM today.  Uncertain when she was last seen normal.  States she has decreased sensation to her left side of her face and feels like her mouth is drooping and noticed she had trouble trying to drink from a glass.  She denies any weakness in her arms or legs.  No trouble talking.  No vision changes.  Mild headache.  No chest pain or shortness of breath.  No abdominal pain, nausea or vomiting.  Code stroke was activated in triage.  The history is provided by the patient.       Past Medical History:  Diagnosis Date  . Acute bronchitis 02/18/2016  . Arthritis    knees  . Carpal tunnel syndrome of right wrist 06/2013  . Chest pain 02/24/2015  . Dental crowns present   . Depression   . Essential hypertension   . GERD (gastroesophageal reflux disease)   . GOITER, MULTINODULAR 09/05/2007   Qualifier: Diagnosis of  By: Loanne Drilling MD, Jacelyn Pi   . History of hyperthyroidism   . Hypertension    under control with med., has been on med. x 9 yr.  . Hypertensive urgency    under control with med., has been on med. x 9 yr.   . HYPERTHYROIDISM 09/05/2007   Qualifier: Diagnosis of  By: Loanne Drilling MD, Jacelyn Pi   . Hypothyroidism 02/24/2015  . Pain in the chest   . Palpitations 05/19/2017  . Thyroid disease   . Tobacco abuse   . Weakness 02/18/2016    Patient Active Problem List   Diagnosis Date Noted  . Palpitations 05/19/2017  . Acute bronchitis 02/18/2016  . Weakness 02/18/2016  . Chest pain 02/24/2015  . Depression 02/24/2015  . Hypothyroidism 02/24/2015  . GERD  (gastroesophageal reflux disease)   . Hypertensive urgency   . Arthritis   . Tobacco abuse   . Pain in the chest   . Essential hypertension   . GOITER, MULTINODULAR 09/05/2007  . HYPERTHYROIDISM 09/05/2007    Past Surgical History:  Procedure Laterality Date  . APPENDECTOMY    . CARPAL TUNNEL RELEASE Right 07/04/2013   Procedure: RIGHT CARPAL TUNNEL RELEASE;  Surgeon: Wynonia Sours, MD;  Location: [facility];  Service: Orthopedics;  Laterality: Right;  . HERNIA REPAIR    . INGUINAL HERNIA REPAIR    . KNEE ARTHROSCOPY Right      OB History   No obstetric history on file.     Family History  Problem Relation Age of Onset  . Hypertension Mother   . Hypertension Father   . Kidney disease Father   . Diverticulosis Father   . Diabetes Sister   . Diabetes Brother     Social History   Tobacco Use  . Smoking status: Current Every Day Smoker    Packs/day: 0.50    Years: 30.00    Pack years: 15.00    Types: Cigarettes  . Smokeless tobacco: Never Used  . Tobacco comment: 7 cig./day  Substance Use Topics  . Alcohol use: Yes  Comment: infrequently/rare  . Drug use: No    Home Medications Prior to Admission medications   Medication Sig Start Date End Date Taking? Authorizing Provider  acetaminophen (TYLENOL) 500 MG tablet Take 1,000 mg by mouth as needed for mild pain.     [provider]  amLODipine (NORVASC) 10 MG tablet Take 1 tablet (10 mg total) by mouth daily. 02/25/15   Debbe Odea, MD  diclofenac (VOLTAREN) 75 MG EC tablet Take 75 mg by mouth daily.  10/15/16   [provider]  levothyroxine (SYNTHROID, LEVOTHROID) 175 MCG tablet Take 175 mcg by mouth daily before breakfast.     [provider]  losartan-hydrochlorothiazide (HYZAAR) 100-25 MG per tablet Take 1 tablet by mouth daily.    [provider]  Magnesium 500 MG TABS Take 500 mg by mouth daily.     [provider]  omeprazole (PRILOSEC) 20 MG  capsule Take 20 mg by mouth daily before lunch.     [provider]    Allergies    Hydrocodone-homatropine and Lipitor [atorvastatin]  Review of Systems   Review of Systems  Constitutional: Negative for activity change, appetite change, fatigue and fever.  HENT: Negative for congestion and rhinorrhea.   Eyes: Negative for visual disturbance.  Respiratory: Negative for cough and shortness of breath.   Cardiovascular: Negative for chest pain.  Gastrointestinal: Negative for abdominal pain, nausea and vomiting.  Endocrine: Negative for polyuria.  Genitourinary: Negative for dysuria and hematuria.  Musculoskeletal: Negative for arthralgias and myalgias.  Skin: Negative for rash.  Neurological: Positive for weakness, numbness and headaches. Negative for dizziness.   all other systems are negative except as noted in the HPI and PMH.    Physical Exam Updated Vital Signs BP (!) 126/106 (BP Location: Left Arm)   Pulse 78   Temp (!) 97.1 F (36.2 C) (Oral)   Resp (!) 21   SpO2 98%   Physical Exam Vitals and nursing note reviewed.  Constitutional:      General: She is not in acute distress.    Appearance: She is well-developed.  HENT:     Head: Normocephalic and atraumatic.     Mouth/Throat:     Pharynx: No oropharyngeal exudate.  Eyes:     Conjunctiva/sclera: Conjunctivae normal.     Pupils: Pupils are equal, round, and reactive to light.  Neck:     Comments: No meningismus. Cardiovascular:     Rate and Rhythm: Normal rate and regular rhythm.     Heart sounds: Normal heart sounds. No murmur.  Pulmonary:     Effort: Pulmonary effort is normal. No respiratory distress.     Breath sounds: Normal breath sounds.  Abdominal:     Palpations: Abdomen is soft.     Tenderness: There is no abdominal tenderness. There is no guarding or rebound.  Musculoskeletal:        General: No tenderness. Normal range of motion.     Cervical back: Normal range of motion and neck  supple.  Skin:    General: Skin is warm.  Neurological:     Mental Status: She is alert and oriented to person, place, and time.     Cranial Nerves: No cranial nerve deficit.     Motor: No abnormal muscle tone.     Coordination: Coordination normal.     Comments: Left facial droop, asymmetric smile, tongue is midline.  Able to raise eyebrows bilaterally.  Difficulty closing left eye. Decreased sensation to left face objectively. 5/5  strength of bilateral arms and legs.  No pronator drift  Psychiatric:        Behavior: Behavior normal.     ED Results / Procedures / Treatments   Labs (all labs ordered are listed, but only abnormal results are displayed) Labs Reviewed  CBC - Abnormal; Notable for the following components:      Result Value   Hemoglobin 15.6 (*)    HCT 46.1 (*)    Platelets 419 (*)    All other components within normal limits  COMPREHENSIVE METABOLIC PANEL - Abnormal; Notable for the following components:   Potassium 3.4 (*)    All other components within normal limits  URINALYSIS, ROUTINE W REFLEX MICROSCOPIC - Abnormal; Notable for the following components:   Color, Urine STRAW (*)    All other components within normal limits  I-STAT CHEM 8, ED - Abnormal; Notable for the following components:   Potassium 3.3 (*)    Calcium, Ion 1.08 (*)    Hemoglobin 15.6 (*)    All other components within normal limits  I-STAT BETA HCG BLOOD, ED (MC, [facility], AP ONLY) - Abnormal; Notable for the following components:   I-stat hCG, quantitative 7.8 (*)    All other components within normal limits  ETHANOL  PROTIME-INR  APTT  DIFFERENTIAL  RAPID URINE DRUG SCREEN, HOSP PERFORMED  CBG MONITORING, ED    EKG EKG Interpretation  Date/Time:  Tuesday February 21 2019 20:07:33 EST Ventricular Rate:  77 PR Interval:    QRS Duration: 88 QT Interval:  432 QTC Calculation: 489 R Axis:   60 Text Interpretation: Sinus rhythm LAE, consider biatrial enlargement Borderline  prolonged QT interval No significant change was found Confirmed by Ezequiel Essex [phone]) on 02/21/2019 10:36:17 PM   Radiology CT HEAD CODE STROKE WO CONTRAST  Result Date: 02/21/2019 CLINICAL DATA:  Code stroke. Code stroke presentation. Specific defect not described. EXAM: CT HEAD WITHOUT CONTRAST TECHNIQUE: Contiguous axial images were obtained from the base of the skull through the vertex without intravenous contrast. COMPARISON:  11/16/2016 FINDINGS: Brain: Normal appearance without evidence of old or acute infarction, mass lesion, hemorrhage, hydrocephalus or extra-axial collection. Vascular: No abnormal vascular finding. Skull: Normal Sinuses/Orbits: Clear/normal Other: None ASPECTS ([place] Stroke Program Early CT Score). Applicable to either side, though lateralizing symptoms were not provided. - Ganglionic level infarction (caudate, lentiform nuclei, internal capsule, insula, M1-M3 cortex): 7 - Supraganglionic infarction (M4-M6 cortex): 3 Total score (0-10 with 10 being normal): 10 IMPRESSION: 1. Normal head CT 2. ASPECTS is 10, though specific symptoms were not provided. 3. These results were communicated to Dr. Rory Percy at 8:00 pmon 12/29/2020by text page via the [facility] messaging system. Electronically Signed   By: Nelson Chimes M.D.   On: 02/21/2019 20:02    Procedures Procedures (including critical care time)  Medications Ordered in ED Medications - No data to display  ED Course  I have reviewed the triage vital signs and the nursing notes.  Pertinent labs & imaging results that were available during my care of the patient were reviewed by me and considered in my medical decision making (see chart for details).    MDM Rules/Calculators/A&P                     Patient with left-sided facial droop and numbness with asymmetric smile.    Forehead is spared but Bell's palsy is still possible.  Cannot rule out new CVA however  CT head is negative.  Labs show mild  hypokalemia.  Seen by Dr. Cheral Marker of neurology who agrees likely Bell's palsy but does recommend brain MRI to rule out infarct.  If this is negative, she can follow-up with her PCP with treatment for Bell's palsy including steroids and antivirals.  MRI pending at shift change.   Final Clinical Impression(s) / ED Diagnoses Final diagnoses:  Bell's palsy    Rx / DC Orders ED Discharge Orders    None       Daven Montz, Annie Main, MD 02/21/19 2258

## 2019-02-21 NOTE — ED Provider Notes (Signed)
Plan for MRI, if negative can be discharged with Bells Palsy, meds already sent.     Ripley Fraise, MD 02/21/19 [phone]

## 2019-02-21 NOTE — ED Notes (Signed)
Called [facility] to activate code stroke @ 19:43

## 2019-02-22 MED ORDER — GADOBUTROL 1 MMOL/ML IV SOLN
10.0000 mL | Freq: Once | INTRAVENOUS | Status: AC | PRN
  Administered 2019-02-22: 10 mL via INTRAVENOUS

## 2019-02-22 NOTE — ED Notes (Signed)
Patient returned from MRI.

## 2019-02-22 NOTE — ED Provider Notes (Signed)
MRI negative for acute infarct.  Patient is stable for discharge for Bell's palsy. Discussed appropriate eye care.  Referred to neurology prescriptions have already been sent   Ripley Fraise, MD 02/22/19 [phone]

## 2019-02-22 NOTE — ED Notes (Signed)
Patient transported to MRI 

## 2019-03-03 ENCOUNTER — Ambulatory Visit: Payer: 59 | Admitting: Neurology

## 2019-03-03 ENCOUNTER — Other Ambulatory Visit: Payer: Self-pay

## 2019-03-03 ENCOUNTER — Encounter: Payer: Self-pay | Admitting: Neurology

## 2019-03-03 VITALS — BP 187/102 | HR 75 | Temp 97.2°F | Ht 64.0 in | Wt 241.5 lb

## 2019-03-03 DIAGNOSIS — E039 Hypothyroidism, unspecified: Secondary | ICD-10-CM

## 2019-03-03 DIAGNOSIS — G510 Bell's palsy: Secondary | ICD-10-CM | POA: Diagnosis not present

## 2019-03-03 DIAGNOSIS — I10 Essential (primary) hypertension: Secondary | ICD-10-CM

## 2019-03-03 NOTE — Progress Notes (Signed)
[facility]  PATIENT: Sheri Villarreal DOB: January 18, 1961  REFERRING DOCTOR OR PCP:   Leighton Ruff, MD SOURCE: Patient, notes from emergency room, imaging and lab results, CT and MRI images personally reviewed.  _________________________________   HISTORICAL  CHIEF COMPLAINT:  Chief Complaint  Patient presents with  . Bell's Palsey    rm 13 hospital FU    HISTORY OF PRESENT ILLNESS:  I had the pleasure of seeing your patient, Sheri Villarreal, at [facility] for neurologic consultation regarding her left Bell's palsy.  She is a 59 year old woman who noted that her lips seemed weak when she was using a straw 02/21/2019.   She was concerned about a stroke and went to the ED.    She was prescribed a steroid dose pack and Valtrex.   She had an MRI showing enhancement of the 7th nerve on the left.   She felt symptoms actually worsened the next 2 days, plateaued over the weekend and have improved over the next week.  She has noted a watery eye.   Taste is altered.    She denies hyperacusis though some sounds are more tinny.   She finished the Valtrex and will finish the steroid pack today.      She has no numbness or weakness outside the face.    She is walking well and has no ataxia.    Vision is fine.      She also has noted that her chronic back pain is better with the prednisone.   She takes diclofenac at bedtime and occassionally twice a day.      I reviewed the reports from her emergency room visit (labs and imaging) and also personally reviewed the CT and MRI images.  The CT scan was essentially normal.  The MRI with added thin section views through the internal auditory canals showed enhancement of the labyrinthine and tympanic segments of the left 7th cranial nerve.  This is persistent with her history of Bell's palsy.  No significant mastoid disease was noted.  The brain was normal for age.  She is otherwise healthy.   She has essential  hypertension and hypothyroidism.Marland Kitchen     REVIEW OF SYSTEMS: Constitutional: No fevers, chills, sweats, or change in appetite Eyes: No visual changes, double vision, eye pain Ear, nose and throat: No hearing loss, ear pain, nasal congestion, sore throat Cardiovascular: No chest pain, palpitations Respiratory: No shortness of breath at rest or with exertion.   No wheezes GastrointestinaI: No nausea, vomiting, diarrhea, abdominal pain, fecal incontinence Genitourinary: No dysuria, urinary retention or frequency.  No nocturia. Musculoskeletal: No neck pain, back pain Integumentary: No rash, pruritus, skin lesions Neurological: as above Psychiatric: Mood is doing okay though she has had some depression. Endocrine: No palpitations, diaphoresis, change in appetite, change in weigh or increased thirst Hematologic/Lymphatic: No anemia, purpura, petechiae. Allergic/Immunologic: No itchy/runny eyes, nasal congestion, recent allergic reactions, rashes  ALLERGIES: Allergies  Allergen Reactions  . Hydrocodone-Homatropine Itching    Reaction to Hycodan cough syrup (pt has taken norco in the past with no reaction)  . Lipitor [Atorvastatin] Other (See Comments)    Leg pain    HOME MEDICATIONS:  Current Outpatient Medications:  .  acetaminophen (TYLENOL) 500 MG tablet, Take 1,000 mg by mouth as needed for mild pain. , Disp: , Rfl:  .  amLODipine (NORVASC) 10 MG tablet, Take 1 tablet (10 mg total) by mouth daily., Disp: 30 tablet, Rfl: 0 .  diclofenac (VOLTAREN)  75 MG EC tablet, Take 75 mg by mouth daily. , Disp: , Rfl: 5 .  escitalopram (LEXAPRO) 10 MG tablet, Take 10 mg by mouth daily., Disp: , Rfl:  .  levothyroxine (SYNTHROID, LEVOTHROID) 175 MCG tablet, Take 175 mcg by mouth daily before breakfast. , Disp: , Rfl:  .  losartan-hydrochlorothiazide (HYZAAR) 100-25 MG per tablet, Take 1 tablet by mouth daily., Disp: , Rfl:  .  Magnesium 500 MG TABS, Take 500 mg by mouth daily. , Disp: , Rfl:  .   omeprazole (PRILOSEC) 20 MG capsule, Take 20 mg by mouth daily before lunch. , Disp: , Rfl:  .  predniSONE (DELTASONE) 20 MG tablet, Take 3 tablets (60 mg total) by mouth daily., Disp: 30 tablet, Rfl: 0  PAST MEDICAL HISTORY: Past Medical History:  Diagnosis Date  . Acute bronchitis 02/18/2016  . Arthritis    knees  . Bell's palsy   . Carpal tunnel syndrome of right wrist 06/2013  . Chest pain 02/24/2015  . Dental crowns present   . Depression   . Essential hypertension   . GERD (gastroesophageal reflux disease)   . GOITER, MULTINODULAR 09/05/2007   Qualifier: Diagnosis of  By: Loanne Drilling MD, Jacelyn Pi   . History of hyperthyroidism   . Hypertension    under control with med., has been on med. x 9 yr.  . Hypertensive urgency    under control with med., has been on med. x 9 yr.   . HYPERTHYROIDISM 09/05/2007   Qualifier: Diagnosis of  By: Loanne Drilling MD, Jacelyn Pi   . Hypothyroidism 02/24/2015  . Pain in the chest   . Palpitations 05/19/2017  . Thyroid disease   . Tobacco abuse   . Weakness 02/18/2016    PAST SURGICAL HISTORY: Past Surgical History:  Procedure Laterality Date  . APPENDECTOMY    . CARPAL TUNNEL RELEASE Right 07/04/2013   Procedure: RIGHT CARPAL TUNNEL RELEASE;  Surgeon: Wynonia Sours, MD;  Location: [facility];  Service: Orthopedics;  Laterality: Right;  . HERNIA REPAIR    . INGUINAL HERNIA REPAIR    . KNEE ARTHROSCOPY Right     FAMILY HISTORY: Family History  Problem Relation Age of Onset  . Hypertension Mother   . Hypertension Father   . Kidney disease Father   . Diverticulosis Father   . Diabetes Sister   . Diabetes Brother     SOCIAL HISTORY:  Social History   Socioeconomic History  . Marital status: Single    Spouse name: Not on file  . Number of children: Not on file  . Years of education: Not on file  . Highest education level: Some college, no degree  Occupational History  . Not on file  Tobacco Use  . Smoking status: Current Every  Day Smoker    Packs/day: 0.50    Years: 30.00    Pack years: 15.00    Types: Cigarettes  . Smokeless tobacco: Never Used  . Tobacco comment: 7 cig./day  Substance and Sexual Activity  . Alcohol use: Yes    Comment: infrequently/rare  . Drug use: No  . Sexual activity: Not on file  Other Topics Concern  . Not on file  Social History Narrative   Lives alone   Social Determinants of Health   Financial Resource Strain:   . Difficulty of Paying Living Expenses: Not on file  Food Insecurity:   . Worried About Charity fundraiser in the Last Year: Not on file  .  Ran Out of Food in the Last Year: Not on file  Transportation Needs:   . Lack of Transportation (Medical): Not on file  . Lack of Transportation (Non-Medical): Not on file  Physical Activity:   . Days of Exercise per Week: Not on file  . Minutes of Exercise per Session: Not on file  Stress:   . Feeling of Stress : Not on file  Social Connections:   . Frequency of Communication with Friends and Family: Not on file  . Frequency of Social Gatherings with Friends and Family: Not on file  . Attends Religious Services: Not on file  . Active Member of Clubs or Organizations: Not on file  . Attends Archivist Meetings: Not on file  . Marital Status: Not on file  Intimate Partner Violence:   . Fear of Current or Ex-Partner: Not on file  . Emotionally Abused: Not on file  . Physically Abused: Not on file  . Sexually Abused: Not on file     PHYSICAL EXAM  Vitals:   03/03/19 0828  BP: (!) 187/102  Pulse: 75  Temp: (!) 97.2 F (36.2 C)  Weight: 241 lb 8 oz (109.5 kg)  Height: 5\' 4"  (1.626 m)    Body mass index is 41.45 kg/m.   General: The patient is well-developed and well-nourished and in no acute distress  HEENT:  Head is [place]/AT.  Sclera are anicteric.  Funduscopic exam shows normal optic discs and retinal vessels.  Neck: No carotid bruits are noted.  The neck is nontender with good range of  motion.  Cardiovascular: The heart has a regular rate and rhythm with a normal S1 and S2. There were no murmurs, gallops or rubs.    Skin: Extremities are without rash or edema.  Neurologic Exam  Mental status: The patient is alert and oriented x 3 at the time of the examination. The patient has apparent normal recent and remote memory, with an apparently normal attention span and concentration ability.   Speech is normal.  Cranial nerves: Extraocular movements are full. Pupils are equal, round, and reactive to light and accomodation.  She has normal facial sensation.  There is facial asymmetry with reduced strength in the upper and lower face on the left consistent with a Bell's palsy.  Additionally she has reduced sensation of the posterior tongue on the left trapezius and sternocleidomastoid strength is normal. No dysarthria is noted.  The tongue is midline, and the patient has symmetric elevation of the soft palate. No obvious hearing deficits or asymmetry noted.  Motor:  Muscle bulk is normal.   Tone is normal. Strength is  5 / 5 in all 4 extremities.   Sensory: Sensory testing is intact to pinprick, soft touch and vibration sensation in all 4 extremities.  Coordination: Cerebellar testing reveals good finger-nose-finger and heel-to-shin bilaterally.  Gait and station: Station is normal.   Gait and tandem gait are normal.. Romberg is negative.   Reflexes: Deep tendon reflexes are symmetric and normal bilaterally.       DIAGNOSTIC DATA (LABS, IMAGING, TESTING) - I reviewed patient records, labs, notes, testing and imaging myself where available.  Lab Results  Component Value Date   WBC 10.3 02/21/2019   HGB 15.6 (H) 02/21/2019   HCT 46.0 02/21/2019   MCV 95.4 02/21/2019   PLT 419 (H) 02/21/2019      Component Value Date/Time   NA 135 02/21/2019 2002   K 3.3 (L) 02/21/2019 2002   CL 100 02/21/2019  2002   CO2 24 02/21/2019 1943   GLUCOSE 83 02/21/2019 2002   BUN 13  02/21/2019 2002   CREATININE 0.70 02/21/2019 2002   CALCIUM 8.9 02/21/2019 1943   PROT 7.5 02/21/2019 1943   ALBUMIN 4.0 02/21/2019 1943   AST 20 02/21/2019 1943   ALT 21 02/21/2019 1943   ALKPHOS 75 02/21/2019 1943   BILITOT 0.7 02/21/2019 1943   GFRNONAA >60 02/21/2019 1943   GFRAA >60 02/21/2019 1943   Lab Results  Component Value Date   CHOL 245 (H) 01/25/2018   HDL 42 01/25/2018   LDLCALC 169 (H) 01/25/2018   TRIG 168 (H) 01/25/2018   CHOLHDL 5.8 01/25/2018   Lab Results  Component Value Date   HGBA1C 5.7 (H) 02/25/2015   No results found for: VITAMINB12 Lab Results  Component Value Date   TSH 39.583 (H) 01/25/2018       ASSESSMENT AND PLAN  Left-sided Bell's palsy - Plan: Sedimentation rate, C-reactive Protein, LYME, TOTAL AB TEST/REFLEX, B. burgdorfi antibodies  Essential hypertension  Hypothyroidism, unspecified type   In summary, Sheri Villarreal is a 59 year old woman with a left Bell's palsy that occurred about 2 weeks ago.  Fortunately, she is already showing improvement in strength.  Beginning to show improvement early correlates with higher likelihood of complete or near complete recovery.  I suspect she will continue to improve over the next couple months.  Although the etiology is likely idiopathic or post viral, we will check labs to rule out vasculitis and Lyme.  She will call us if she has any new or worsening neurologic symptoms.  If she does not continue to show improvement I would like to see her back.  We will let her know the results of the laboratory test and arrange for further follow-up as needed  Thank you for asking me to see Sheri Villarreal.  Please let me know if I can be of further assistance with her or other patients in the future.  Diamon Reddinger A. Felecia Shelling, MD, [facility] [id number], [id number] AM Certified in Neurology, Clinical Neurophysiology, Sleep Medicine and Neuroimaging  [facility] [address] [place], [place]  [address] [phone]

## 2019-03-06 ENCOUNTER — Telehealth: Payer: Self-pay | Admitting: *Deleted

## 2019-03-06 LAB — SEDIMENTATION RATE: Sed Rate: 16 mm/hr (ref 0–40)

## 2019-03-06 LAB — B. BURGDORFI ANTIBODIES: Lyme IgG/IgM Ab: <0.91 {ISR} (ref 0.00–0.90)

## 2019-03-06 LAB — C-REACTIVE PROTEIN: CRP: <1 mg/L (ref 0–10)

## 2019-03-06 NOTE — Telephone Encounter (Signed)
-----   Message from Britt Bottom, MD sent at 03/06/2019  4:44 PM EST ----- Please let the patient know that the lab work is fine.

## 2019-03-06 NOTE — Telephone Encounter (Signed)
Called and spoke with pt about lab results per Dr. Sater note. Pt verbalized understanding.  

## 2019-04-21 ENCOUNTER — Encounter (HOSPITAL_COMMUNITY): Payer: Self-pay | Admitting: Emergency Medicine

## 2019-04-21 ENCOUNTER — Ambulatory Visit (HOSPITAL_COMMUNITY)
Admission: EM | Admit: 2019-04-21 | Discharge: 2019-04-21 | Disposition: A | Discharge status: Home / Self Care | Payer: 59 | Attending: Family Medicine | Admitting: Family Medicine

## 2019-04-21 ENCOUNTER — Other Ambulatory Visit: Payer: Self-pay

## 2019-04-21 DIAGNOSIS — F329 Major depressive disorder, single episode, unspecified: Secondary | ICD-10-CM | POA: Diagnosis not present

## 2019-04-21 DIAGNOSIS — Z888 Allergy status to other drugs, medicaments and biological substances status: Secondary | ICD-10-CM | POA: Diagnosis not present

## 2019-04-21 DIAGNOSIS — I10 Essential (primary) hypertension: Secondary | ICD-10-CM | POA: Diagnosis not present

## 2019-04-21 DIAGNOSIS — R5383 Other fatigue: Secondary | ICD-10-CM | POA: Diagnosis present

## 2019-04-21 DIAGNOSIS — Z20822 Contact with and (suspected) exposure to covid-19: Secondary | ICD-10-CM | POA: Insufficient documentation

## 2019-04-21 DIAGNOSIS — Z885 Allergy status to narcotic agent status: Secondary | ICD-10-CM | POA: Diagnosis not present

## 2019-04-21 DIAGNOSIS — Z79890 Hormone replacement therapy (postmenopausal): Secondary | ICD-10-CM | POA: Diagnosis not present

## 2019-04-21 DIAGNOSIS — Z833 Family history of diabetes mellitus: Secondary | ICD-10-CM | POA: Insufficient documentation

## 2019-04-21 DIAGNOSIS — Z841 Family history of disorders of kidney and ureter: Secondary | ICD-10-CM | POA: Diagnosis not present

## 2019-04-21 DIAGNOSIS — Z79899 Other long term (current) drug therapy: Secondary | ICD-10-CM | POA: Insufficient documentation

## 2019-04-21 DIAGNOSIS — R110 Nausea: Secondary | ICD-10-CM | POA: Insufficient documentation

## 2019-04-21 DIAGNOSIS — L03213 Periorbital cellulitis: Secondary | ICD-10-CM

## 2019-04-21 DIAGNOSIS — Z8249 Family history of ischemic heart disease and other diseases of the circulatory system: Secondary | ICD-10-CM | POA: Diagnosis not present

## 2019-04-21 DIAGNOSIS — E039 Hypothyroidism, unspecified: Secondary | ICD-10-CM | POA: Insufficient documentation

## 2019-04-21 DIAGNOSIS — F17210 Nicotine dependence, cigarettes, uncomplicated: Secondary | ICD-10-CM | POA: Diagnosis not present

## 2019-04-21 DIAGNOSIS — K219 Gastro-esophageal reflux disease without esophagitis: Secondary | ICD-10-CM | POA: Insufficient documentation

## 2019-04-21 MED ORDER — ONDANSETRON 4 MG PO TBDP
ORAL_TABLET | ORAL | Status: AC
  Filled 2019-04-21: qty 1

## 2019-04-21 MED ORDER — ONDANSETRON HCL 4 MG/2ML IJ SOLN
INTRAMUSCULAR | Status: AC
  Filled 2019-04-21: qty 2

## 2019-04-21 MED ORDER — ONDANSETRON HCL 4 MG/2ML IJ SOLN
4.0000 mg | Freq: Once | INTRAMUSCULAR | Status: AC
  Administered 2019-04-21: 4 mg via INTRAMUSCULAR

## 2019-04-21 MED ORDER — CEFDINIR 300 MG PO CAPS: 300.0000 mg | ORAL_CAPSULE | Freq: Two times a day (BID) | ORAL | 0 refills | Status: DC

## 2019-04-21 MED ORDER — ONDANSETRON 8 MG PO TBDP: 8.0000 mg | ORAL_TABLET | Freq: Three times a day (TID) | ORAL | 0 refills | Status: DC | PRN

## 2019-04-21 NOTE — ED Provider Notes (Signed)
[place]   MRN: [id number] DOB: 05-03-1960  Subjective:   Sheri Villarreal is a 59 y.o. female presenting for several day history of acute onset fatigue, malaise, nausea, right eye/facial swelling and pain.  She had transient right ear pain but this resolved.  Has a history of Bell's palsy of the left side, this happened in December.  She denies facial drooping of her right side.  No current facility-administered medications for this encounter.  Current Outpatient Medications:  .  acetaminophen (TYLENOL) 500 MG tablet, Take 1,000 mg by mouth as needed for mild pain. , Disp: , Rfl:  .  amLODipine (NORVASC) 10 MG tablet, Take 1 tablet (10 mg total) by mouth daily., Disp: 30 tablet, Rfl: 0 .  diclofenac (VOLTAREN) 75 MG EC tablet, Take 75 mg by mouth daily. , Disp: , Rfl: 5 .  escitalopram (LEXAPRO) 10 MG tablet, Take 10 mg by mouth daily., Disp: , Rfl:  .  levothyroxine (SYNTHROID, LEVOTHROID) 175 MCG tablet, Take 175 mcg by mouth daily before breakfast. , Disp: , Rfl:  .  losartan-hydrochlorothiazide (HYZAAR) 100-25 MG per tablet, Take 1 tablet by mouth daily., Disp: , Rfl:  .  Magnesium 500 MG TABS, Take 500 mg by mouth daily. , Disp: , Rfl:  .  omeprazole (PRILOSEC) 20 MG capsule, Take 20 mg by mouth daily before lunch. , Disp: , Rfl:  .  predniSONE (DELTASONE) 20 MG tablet, Take 3 tablets (60 mg total) by mouth daily., Disp: 30 tablet, Rfl: 0   Allergies  Allergen Reactions  . Hydrocodone-Homatropine Itching    Reaction to Hycodan cough syrup (pt has taken norco in the past with no reaction)  . Lipitor [Atorvastatin] Other (See Comments)    Leg pain    Past Medical History:  Diagnosis Date  . Acute bronchitis 02/18/2016  . Arthritis    knees  . Bell's palsy   . Carpal tunnel syndrome of right wrist 06/2013  . Chest pain 02/24/2015  . Dental crowns present   . Depression   . Essential hypertension   . GERD (gastroesophageal reflux disease)   . GOITER,  MULTINODULAR 09/05/2007   Qualifier: Diagnosis of  By: Loanne Drilling MD, Jacelyn Pi   . History of hyperthyroidism   . Hypertension    under control with med., has been on med. x 9 yr.  . Hypertensive urgency    under control with med., has been on med. x 9 yr.   . HYPERTHYROIDISM 09/05/2007   Qualifier: Diagnosis of  By: Loanne Drilling MD, Jacelyn Pi   . Hypothyroidism 02/24/2015  . Pain in the chest   . Palpitations 05/19/2017  . Thyroid disease   . Tobacco abuse   . Weakness 02/18/2016     Past Surgical History:  Procedure Laterality Date  . APPENDECTOMY    . CARPAL TUNNEL RELEASE Right 07/04/2013   Procedure: RIGHT CARPAL TUNNEL RELEASE;  Surgeon: Wynonia Sours, MD;  Location: [facility];  Service: Orthopedics;  Laterality: Right;  . HERNIA REPAIR    . INGUINAL HERNIA REPAIR    . KNEE ARTHROSCOPY Right     Family History  Problem Relation Age of Onset  . Hypertension Mother   . Hypertension Father   . Kidney disease Father   . Diverticulosis Father   . Diabetes Sister   . Diabetes Brother     Social History   Tobacco Use  . Smoking status: Current Every Day Smoker    Packs/day: 0.50  Years: 30.00    Pack years: 15.00    Types: Cigarettes  . Smokeless tobacco: Never Used  . Tobacco comment: 7 cig./day  Substance Use Topics  . Alcohol use: Yes    Comment: infrequently/rare  . Drug use: No    ROS   Objective:   Vitals: BP (!) 156/87   Pulse 91   Temp 99 F (37.2 C)   Resp (!) 22   SpO2 98%   Physical Exam Constitutional:      General: She is not in acute distress.    Appearance: Normal appearance. She is well-developed. She is not ill-appearing, toxic-appearing or diaphoretic.  HENT:     Head: Normocephalic and atraumatic.     Right Ear: Tympanic membrane, ear canal and external ear normal. There is no impacted cerumen.     Left Ear: Tympanic membrane, ear canal and external ear normal. There is no impacted cerumen.     Ears:     Comments: Dislodged  tympanostomy tube, likely held in place by cerumen.    Nose: Nose normal.     Mouth/Throat:     Mouth: Mucous membranes are moist.     Pharynx: Oropharynx is clear. No oropharyngeal exudate or posterior oropharyngeal erythema.  Eyes:     General: No scleral icterus.       Right eye: Discharge (Clear) present.        Left eye: No discharge.     Extraocular Movements: Extraocular movements intact.     Conjunctiva/sclera: Conjunctivae normal.     Right eye: Right conjunctiva is not injected. No chemosis, exudate or hemorrhage.    Left eye: Left conjunctiva is not injected. No chemosis, exudate or hemorrhage.    Pupils: Pupils are equal, round, and reactive to light.   Cardiovascular:     Rate and Rhythm: Normal rate.  Pulmonary:     Effort: Pulmonary effort is normal.  Skin:    General: Skin is warm and dry.  Neurological:     General: No focal deficit present.     Mental Status: She is alert and oriented to person, place, and time.  Psychiatric:        Mood and Affect: Mood normal.        Behavior: Behavior normal.        Thought Content: Thought content normal.        Judgment: Judgment normal.     Assessment and Plan :   1. Preseptal cellulitis of right eye   2. Nausea without vomiting     Start cefdinir for management of preseptal cellulitis.  Suspect that fatigue and nausea or systemic signs of illness.  She was given IM Zofran in clinic.  Use p.o. Zofran at home.  Supportive care recommended otherwise.  Counseled patient on potential for adverse effects with medications prescribed/recommended today, ER and return-to-clinic precautions discussed, patient verbalized understanding.    Jaynee Eagles, [place] 04/21/19 1801

## 2019-04-21 NOTE — ED Triage Notes (Addendum)
Pt c/o exhaustion, fatigue, states "my face is swollen, tender" pt has swelling to the right side of her face. Pt feels like her "eyes are squinty". Pt c/o R inner ear pain. Pt states she had bells palsy on Dec 29th. No facial droop. No numbness/tingling. Pt also c/o nausea

## 2019-04-22 LAB — NOVEL CORONAVIRUS, NAA (HOSP ORDER, SEND-OUT TO REF LAB; TAT 18-24 HRS): SARS-CoV-2, NAA: NOT DETECTED

## 2019-08-12 ENCOUNTER — Other Ambulatory Visit: Payer: Self-pay

## 2019-08-12 ENCOUNTER — Encounter (HOSPITAL_COMMUNITY): Payer: Self-pay

## 2019-08-12 ENCOUNTER — Ambulatory Visit (HOSPITAL_COMMUNITY)
Admission: RE | Admit: 2019-08-12 | Discharge: 2019-08-12 | Disposition: A | Discharge status: Home / Self Care | Payer: Self-pay | Source: Ambulatory Visit | Attending: Family Medicine | Admitting: Family Medicine

## 2019-08-12 VITALS — BP 193/104 | HR 68 | Temp 98.5°F | Resp 20

## 2019-08-12 DIAGNOSIS — M546 Pain in thoracic spine: Secondary | ICD-10-CM

## 2019-08-12 NOTE — ED Triage Notes (Signed)
Pt reports right sided back pain and tingling, fatigue and nausea x 2-3 days. Pt denies chest pain, abdominal pain, diarrhea.

## 2019-08-12 NOTE — ED Provider Notes (Signed)
[facility]    CSN: [id number] Arrival date & time: 08/12/19  1346      History   Chief Complaint Chief Complaint  Patient presents with  . Appointment    1400  . Back Pain  . Fatigue    HPI Sheri Villarreal is a 59 y.o. female.   She is presenting with left-sided back pain.  It radiates from the top to the bottom of the back.  No inciting event or trauma.  Has been ongoing for a few days.  She has had associated fatigue and nausea with it.  She is in been able to take her medications for a few days due to this nausea.  No history of similar pain.  No chest pain.  Does not seem to be positional.  Not associated with exercise.  HPI  Past Medical History:  Diagnosis Date  . Acute bronchitis 02/18/2016  . Arthritis    knees  . Bell's palsy   . Carpal tunnel syndrome of right wrist 06/2013  . Chest pain 02/24/2015  . Dental crowns present   . Depression   . Essential hypertension   . GERD (gastroesophageal reflux disease)   . GOITER, MULTINODULAR 09/05/2007   Qualifier: Diagnosis of  By: Loanne Drilling MD, Jacelyn Pi   . History of hyperthyroidism   . Hypertension    under control with med., has been on med. x 9 yr.  . Hypertensive urgency    under control with med., has been on med. x 9 yr.   . HYPERTHYROIDISM 09/05/2007   Qualifier: Diagnosis of  By: Loanne Drilling MD, Jacelyn Pi   . Hypothyroidism 02/24/2015  . Pain in the chest   . Palpitations 05/19/2017  . Thyroid disease   . Tobacco abuse   . Weakness 02/18/2016    Patient Active Problem List   Diagnosis Date Noted  . Palpitations 05/19/2017  . Acute bronchitis 02/18/2016  . Weakness 02/18/2016  . Chest pain 02/24/2015  . Depression 02/24/2015  . Hypothyroidism 02/24/2015  . GERD (gastroesophageal reflux disease)   . Hypertensive urgency   . Arthritis   . Tobacco abuse   . Pain in the chest   . Essential hypertension   . GOITER, MULTINODULAR 09/05/2007  . HYPERTHYROIDISM 09/05/2007    Past Surgical History:    Procedure Laterality Date  . APPENDECTOMY    . CARPAL TUNNEL RELEASE Right 07/04/2013   Procedure: RIGHT CARPAL TUNNEL RELEASE;  Surgeon: Wynonia Sours, MD;  Location: [facility];  Service: Orthopedics;  Laterality: Right;  . HERNIA REPAIR    . INGUINAL HERNIA REPAIR    . KNEE ARTHROSCOPY Right     OB History   No obstetric history on file.      Home Medications    Prior to Admission medications   Medication Sig Start Date End Date Taking? Authorizing Provider  acetaminophen (TYLENOL) 500 MG tablet Take 1,000 mg by mouth as needed for mild pain.     [provider]  amLODipine (NORVASC) 10 MG tablet Take 1 tablet (10 mg total) by mouth daily. 02/25/15   Debbe Odea, MD  cefdinir (OMNICEF) 300 MG capsule Take 1 capsule (300 mg total) by mouth 2 (two) times daily. 04/21/19   Jaynee Eagles, PA-C  diclofenac (VOLTAREN) 75 MG EC tablet Take 75 mg by mouth daily.  10/15/16   [provider]  escitalopram (LEXAPRO) 10 MG tablet Take 10 mg by mouth daily. 02/23/19   [provider]  levothyroxine (SYNTHROID, LEVOTHROID) 175 MCG tablet Take 175 mcg by mouth daily before breakfast.     [provider]  losartan-hydrochlorothiazide (HYZAAR) 100-25 MG per tablet Take 1 tablet by mouth daily.    [provider]  Magnesium 500 MG TABS Take 500 mg by mouth daily.     [provider]  omeprazole (PRILOSEC) 20 MG capsule Take 20 mg by mouth daily before lunch.     [provider]  ondansetron (ZOFRAN-ODT) 8 MG disintegrating tablet Take 1 tablet (8 mg total) by mouth every 8 (eight) hours as needed for nausea or vomiting. 04/21/19   Jaynee Eagles, PA-C    Family History Family History  Problem Relation Age of Onset  . Hypertension Mother   . Hypertension Father   . Kidney disease Father   . Diverticulosis Father   . Diabetes Sister   . Diabetes Brother     Social History Social History   Tobacco Use  . Smoking status:  Current Every Day Smoker    Packs/day: 0.50    Years: 30.00    Pack years: 15.00    Types: Cigarettes  . Smokeless tobacco: Never Used  . Tobacco comment: 7 cig./day  Vaping Use  . Vaping Use: Never used  Substance Use Topics  . Alcohol use: Yes    Comment: infrequently/rare  . Drug use: No     Allergies   Hydrocodone-homatropine and Lipitor [atorvastatin]   Review of Systems Review of Systems  See HPI   Physical Exam Triage Vital Signs ED Triage Vitals  Enc Vitals Group     BP 08/12/19 1359 (!) 193/104     Pulse Rate 08/12/19 1359 68     Resp 08/12/19 1359 20     Temp 08/12/19 1359 98.5 F (36.9 C)     Temp Source 08/12/19 1359 Oral     SpO2 08/12/19 1359 96 %     Weight --      Height --      Head Circumference --      Peak Flow --      Pain Score 08/12/19 1358 5     Pain Loc --      Pain Edu? --      Excl. in [place]? --    No data found.  Updated Vital Signs BP (!) 193/104 (BP Location: Right Arm)   Pulse 68   Temp 98.5 F (36.9 C) (Oral)   Resp 20   SpO2 96%   Visual Acuity Right Eye Distance:   Left Eye Distance:   Bilateral Distance:    Right Eye Near:   Left Eye Near:    Bilateral Near:     Physical Exam Gen: NAD, alert, cooperative with exam, well-appearing ENT: normal lips, normal nasal mucosa,  Eye: normal EOM, normal conjunctiva and lids CV:  no edema, Skin: no rashes, no areas of induration  Neuro: normal tone, normal sensation to touch Psych:  normal insight, alert and oriented MSK:  Back: No tenderness to palpation. No winging of the scapula. Neurovascular intact   UC Treatments / Results  Labs (all labs ordered are listed, but only abnormal results are displayed) Labs Reviewed - No data to display  EKG  EKG: Normal sinus rhythm.  Radiology No results found.  Procedures Procedures (including critical care time)  Medications Ordered in UC Medications - No data to display  Initial Impression / Assessment and Plan  / UC Course  I have reviewed the triage vital signs  and the nursing notes.  Pertinent labs & imaging results that were available during my care of the patient were reviewed by me and considered in my medical decision making (see chart for details).     Sheri Villarreal is a 59 year old female is presenting with left-sided back pain.  She has significantly elevated blood pressure as well.  Seems possible to be musculoskeletal in nature.  Could be associated with aortic dissection versus pancreatitis.  Advised to have close monitoring of her blood pressure.  Informed if it continues to be elevated and she needs to be seen in the emergency department for further work-up.  Counseled on supportive care.  Given indications return to follow-up.  Final Clinical Impressions(s) / UC Diagnoses   Final diagnoses:  Acute left-sided thoracic back pain     Discharge Instructions     Please monitor your blood pressure after taking your medications.  If your blood pressure continues to go up, then you need to be seen in the emergency department.  Please check your blood pressure tomorrow morning as well.  Please be seen in the emergency department if your pain becomes worse.     ED Prescriptions    None     PDMP not reviewed this encounter.   Rosemarie Ax, MD 08/12/19 1505

## 2019-08-12 NOTE — ED Notes (Signed)
BP reported to [facility] [place].

## 2019-08-12 NOTE — Discharge Instructions (Signed)
Please monitor your blood pressure after taking your medications.  If your blood pressure continues to go up, then you need to be seen in the emergency department.  Please check your blood pressure tomorrow morning as well.  Please be seen in the emergency department if your pain becomes worse.

## 2021-03-03 ENCOUNTER — Ambulatory Visit (HOSPITAL_COMMUNITY)
Admission: EM | Admit: 2021-03-03 | Discharge: 2021-03-03 | Disposition: A | Discharge status: Home / Self Care | Payer: Self-pay | Attending: Family Medicine | Admitting: Family Medicine

## 2021-03-03 ENCOUNTER — Other Ambulatory Visit: Payer: Self-pay

## 2021-03-03 ENCOUNTER — Ambulatory Visit (INDEPENDENT_AMBULATORY_CARE_PROVIDER_SITE_OTHER): Payer: Self-pay

## 2021-03-03 ENCOUNTER — Encounter (HOSPITAL_COMMUNITY): Payer: Self-pay

## 2021-03-03 ENCOUNTER — Telehealth (HOSPITAL_COMMUNITY): Payer: Self-pay | Admitting: Emergency Medicine

## 2021-03-03 DIAGNOSIS — F17210 Nicotine dependence, cigarettes, uncomplicated: Secondary | ICD-10-CM | POA: Insufficient documentation

## 2021-03-03 DIAGNOSIS — R052 Subacute cough: Secondary | ICD-10-CM | POA: Insufficient documentation

## 2021-03-03 DIAGNOSIS — R519 Headache, unspecified: Secondary | ICD-10-CM | POA: Insufficient documentation

## 2021-03-03 DIAGNOSIS — R0789 Other chest pain: Secondary | ICD-10-CM | POA: Insufficient documentation

## 2021-03-03 DIAGNOSIS — U071 COVID-19: Secondary | ICD-10-CM | POA: Insufficient documentation

## 2021-03-03 DIAGNOSIS — R064 Hyperventilation: Secondary | ICD-10-CM | POA: Insufficient documentation

## 2021-03-03 DIAGNOSIS — I10 Essential (primary) hypertension: Secondary | ICD-10-CM | POA: Insufficient documentation

## 2021-03-03 DIAGNOSIS — R059 Cough, unspecified: Secondary | ICD-10-CM

## 2021-03-03 DIAGNOSIS — R0602 Shortness of breath: Secondary | ICD-10-CM | POA: Insufficient documentation

## 2021-03-03 DIAGNOSIS — R110 Nausea: Secondary | ICD-10-CM | POA: Insufficient documentation

## 2021-03-03 DIAGNOSIS — E0590 Thyrotoxicosis, unspecified without thyrotoxic crisis or storm: Secondary | ICD-10-CM | POA: Insufficient documentation

## 2021-03-03 DIAGNOSIS — R509 Fever, unspecified: Secondary | ICD-10-CM | POA: Insufficient documentation

## 2021-03-03 LAB — POC INFLUENZA A AND B ANTIGEN (URGENT CARE ONLY)
INFLUENZA A ANTIGEN, POC: NEGATIVE
INFLUENZA B ANTIGEN, POC: NEGATIVE

## 2021-03-03 MED ORDER — AZITHROMYCIN 250 MG PO TABS: 250.0000 mg | ORAL_TABLET | Freq: Every day | ORAL | 0 refills | Status: DC

## 2021-03-03 MED ORDER — PROMETHAZINE-DM 6.25-15 MG/5ML PO SYRP: 5.0000 mL | ORAL_SOLUTION | Freq: Four times a day (QID) | ORAL | 0 refills | Status: AC | PRN

## 2021-03-03 MED ORDER — ONDANSETRON 8 MG PO TBDP: 8.0000 mg | ORAL_TABLET | Freq: Three times a day (TID) | ORAL | 0 refills | Status: DC | PRN

## 2021-03-03 NOTE — ED Triage Notes (Signed)
Pt presents with a non productive cough and tightness in chest since last night.   States she has some trouble breathing and has fever.

## 2021-03-03 NOTE — Discharge Instructions (Addendum)
You were seen today for URI symptoms.  Your flu swab was negative.  Your chest xray did not show any pneumonia.  Given your history of smoking I did send out an antibiotic to your pharmacy, along with cough syrup.  We did a covid swab today and this will be resulted tomorrow.  I recommend rest, fluids, tylenol or motrin to help with your symptoms.  If you are not feeling better please follow up or see your primary care provider.   Your blood pressure is elevated today.  This is likely due to not feeling well.  I recommend you take your blood pressure medication as soon as possible.

## 2021-03-03 NOTE — ED Provider Notes (Signed)
[facility]    CSN: [id number] Arrival date & time: 03/03/21  1009      History   Chief Complaint Chief Complaint  Patient presents with   Cough   Fever    HPI Sheri Villarreal is a 61 y.o. female.   Patient started with a cough last night.  This persisted, and by the time she went to bed she was having chills, felt like she had a fever.  100.4 this morning.  "Labored breathing" this morning, tightness in her chest. + headaches, weakness.  No other body aches.  + nausea, no vomiting;  dry cough;   mild runny nose, congestion, drainage.  No known sick contacts;  She does smoke, < 1ppd;  no known copd.   She admits to being anxious today b/c she does not feel well.  Her bp is elevated.  She also has not taken her bp medication as she has felt nauseated;   Past Medical History:  Diagnosis Date   Acute bronchitis 02/18/2016   Arthritis    knees   Bell's palsy    Carpal tunnel syndrome of right wrist 06/2013   Chest pain 02/24/2015   Dental crowns present    Depression    Essential hypertension    GERD (gastroesophageal reflux disease)    GOITER, MULTINODULAR 09/05/2007   Qualifier: Diagnosis of  By: Loanne Drilling MD, Sean A    History of hyperthyroidism    Hypertension    under control with med., has been on med. x 9 yr.   Hypertensive urgency    under control with med., has been on med. x 9 yr.    HYPERTHYROIDISM 09/05/2007   Qualifier: Diagnosis of  By: Loanne Drilling MD, Sean A    Hypothyroidism 02/24/2015   Pain in the chest    Palpitations 05/19/2017   Thyroid disease    Tobacco abuse    Weakness 02/18/2016    Patient Active Problem List   Diagnosis Date Noted   Palpitations 05/19/2017   Acute bronchitis 02/18/2016   Weakness 02/18/2016   Chest pain 02/24/2015   Depression 02/24/2015   Hypothyroidism 02/24/2015   GERD (gastroesophageal reflux disease)    Hypertensive urgency    Arthritis    Tobacco abuse    Pain in the chest    Essential hypertension     GOITER, MULTINODULAR 09/05/2007   HYPERTHYROIDISM 09/05/2007    Past Surgical History:  Procedure Laterality Date   APPENDECTOMY     CARPAL TUNNEL RELEASE Right 07/04/2013   Procedure: RIGHT CARPAL TUNNEL RELEASE;  Surgeon: Wynonia Sours, MD;  Location: [facility];  Service: Orthopedics;  Laterality: Right;   HERNIA REPAIR     INGUINAL HERNIA REPAIR     KNEE ARTHROSCOPY Right     OB History   No obstetric history on file.      Home Medications    Prior to Admission medications   Medication Sig Start Date End Date Taking? Authorizing Provider  acetaminophen (TYLENOL) 500 MG tablet Take 1,000 mg by mouth as needed for mild pain.     [provider]  amLODipine (NORVASC) 10 MG tablet Take 1 tablet (10 mg total) by mouth daily. 02/25/15   Debbe Odea, MD  cefdinir (OMNICEF) 300 MG capsule Take 1 capsule (300 mg total) by mouth 2 (two) times daily. 04/21/19   Jaynee Eagles, PA-C  diclofenac (VOLTAREN) 75 MG EC tablet Take 75 mg by mouth daily.  10/15/16   [provider]  escitalopram (LEXAPRO) 10 MG tablet Take 10 mg by mouth daily. 02/23/19   [provider]  levothyroxine (SYNTHROID, LEVOTHROID) 175 MCG tablet Take 175 mcg by mouth daily before breakfast.     [provider]  losartan-hydrochlorothiazide (HYZAAR) 100-25 MG per tablet Take 1 tablet by mouth daily.    [provider]  Magnesium 500 MG TABS Take 500 mg by mouth daily.     [provider]  omeprazole (PRILOSEC) 20 MG capsule Take 20 mg by mouth daily before lunch.     [provider]  ondansetron (ZOFRAN-ODT) 8 MG disintegrating tablet Take 1 tablet (8 mg total) by mouth every 8 (eight) hours as needed for nausea or vomiting. 04/21/19   Jaynee Eagles, PA-C    Family History Family History  Problem Relation Age of Onset   Hypertension Mother    Hypertension Father    Kidney disease Father    Diverticulosis Father    Diabetes Sister    Diabetes  Brother     Social History Social History   Tobacco Use   Smoking status: Every Day    Packs/day: 0.50    Years: 30.00    Pack years: 15.00    Types: Cigarettes   Smokeless tobacco: Never   Tobacco comments:    7 cig./day  Vaping Use   Vaping Use: Never used  Substance Use Topics   Alcohol use: Yes    Comment: infrequently/rare   Drug use: No     Allergies   Hydrocodone bit-homatrop mbr and Lipitor [atorvastatin]   Review of Systems Review of Systems  Constitutional:  Positive for chills, fatigue and fever.  HENT:  Positive for congestion and rhinorrhea. Negative for sinus pain.   Respiratory:  Positive for cough and chest tightness.   Cardiovascular:  Negative for chest pain.  Gastrointestinal:  Positive for nausea. Negative for vomiting.  Genitourinary: Negative.   Musculoskeletal: Negative.     Physical Exam Triage Vital Signs ED Triage Vitals  Enc Vitals Group     BP 03/03/21 1132 (!) 180/138     Pulse Rate 03/03/21 1132 99     Resp 03/03/21 1132 17     Temp 03/03/21 1132 99.3 F (37.4 C)     Temp Source 03/03/21 1132 Oral     SpO2 03/03/21 1132 91 %     Weight --      Height --      Head Circumference --      Peak Flow --      Pain Score 03/03/21 1131 6     Pain Loc --      Pain Edu? --      Excl. in [place]? --    No data found.  Updated Vital Signs BP (!) 180/138 (BP Location: Right Arm)    Pulse 99    Temp 99.3 F (37.4 C) (Oral)    Resp 17    SpO2 91%   Visual Acuity Right Eye Distance:   Left Eye Distance:   Bilateral Distance:    Right Eye Near:   Left Eye Near:    Bilateral Near:     Physical Exam Constitutional:      Appearance: Normal appearance.  HENT:     Head: Normocephalic and atraumatic.     Nose: Nose normal.     Mouth/Throat:     Mouth: Mucous membranes are moist.  Cardiovascular:     Rate and Rhythm: Normal rate and regular rhythm.  Pulmonary:  Effort: Pulmonary effort is normal. No respiratory distress.      Breath sounds: Normal breath sounds. No wheezing.  Musculoskeletal:     Cervical back: Normal range of motion. No tenderness.  Lymphadenopathy:     Cervical: Cervical adenopathy present.  Neurological:     Mental Status: She is alert.     UC Treatments / Results  Labs (all labs ordered are listed, but only abnormal results are displayed) Labs Reviewed  SARS CORONAVIRUS 2 (TAT 6-24 HRS)  POC INFLUENZA A AND B ANTIGEN (URGENT CARE ONLY)    EKG   Radiology DG Chest 2 View  Result Date: 03/03/2021 CLINICAL DATA:  Cough and shortness of breath.  Smoker. EXAM: CHEST - 2 VIEW COMPARISON:  01/24/2018 plain film and CT. FINDINGS: Midline trachea. Mild cardiomegaly. Atherosclerosis in the transverse aorta. No pleural effusion or pneumothorax. Minimal increased density along the left heart border is favored to represent scarring and is relatively similar to on the prior exam. Diffuse peribronchial thickening. IMPRESSION: No acute cardiopulmonary disease. Cardiomegaly without congestive failure. Peribronchial thickening which may relate to chronic bronchitis or smoking. Aortic Atherosclerosis (ICD10-I70.0). Electronically Signed   By: Abigail Miyamoto M.D.   On: 03/03/2021 11:56    Procedures Procedures (including critical care time)  Medications Ordered in UC Medications - No data to display  Initial Impression / Assessment and Plan / UC Course  I have reviewed the triage vital signs and the nursing notes.  Pertinent labs & imaging results that were available during my care of the patient were reviewed by me and considered in my medical decision making (see chart for details).  Patient here for cough, fever, uri symptoms.  Her bp was also elevated today.  Flu was negative, swab for covid pending.  Her chest xray was not concerning, but given her smoking history and low oxygen I did cover her with an antibiotic today.  Recommend she increase fluids, and get plenty of rest.  Advised her to please  take her bp medication when she gets home as well, which she understood and agreed with.    Final Clinical Impressions(s) / UC Diagnoses   Final diagnoses:  Subacute cough  Fever, unspecified  Shortness of breath  Essential hypertension  Nausea without vomiting     Discharge Instructions      You were seen today for URI symptoms.  Your flu swab was negative.  Your chest xray did not show any pneumonia.  Given your history of smoking I did send out an antibiotic to your pharmacy, along with cough syrup.  We did a covid swab today and this will be resulted tomorrow.  I recommend rest, fluids, tylenol or motrin to help with your symptoms.  If you are not feeling better please follow up or see your primary care provider.   Your blood pressure is elevated today.  This is likely due to not feeling well.  I recommend you take your blood pressure medication as soon as possible.     ED Prescriptions     Medication Sig Dispense Auth. Provider   azithromycin (ZITHROMAX) 250 MG tablet Take 1 tablet (250 mg total) by mouth daily. Take first 2 tablets together, then 1 every day until finished. 6 tablet Janelys Glassner, Junie Panning, MD   promethazine-dextromethorphan (PROMETHAZINE-DM) 6.25-15 MG/5ML syrup Take 5 mLs by mouth 4 (four) times daily as needed for up to 10 days for cough. 118 mL Rondel Oh, MD      PDMP not reviewed this encounter.  Rondel Oh, MD 03/03/21 [phone]

## 2021-03-03 NOTE — Telephone Encounter (Signed)
Patient called back requesting prescription for Zofran, provider no longer available, I have sent prescription on her behalf.

## 2021-03-04 LAB — SARS CORONAVIRUS 2 (TAT 6-24 HRS): SARS Coronavirus 2: POSITIVE — AB

## 2021-05-11 ENCOUNTER — Emergency Department (HOSPITAL_COMMUNITY): Payer: Self-pay

## 2021-05-11 ENCOUNTER — Ambulatory Visit (HOSPITAL_COMMUNITY)
Admission: EM | Admit: 2021-05-11 | Discharge: 2021-05-11 | Disposition: A | Discharge status: Home / Self Care | Payer: No Payment, Other | Attending: Psychiatry | Admitting: Psychiatry

## 2021-05-11 ENCOUNTER — Encounter (HOSPITAL_COMMUNITY): Payer: Self-pay | Admitting: Emergency Medicine

## 2021-05-11 ENCOUNTER — Emergency Department (HOSPITAL_COMMUNITY)
Admission: EM | Admit: 2021-05-11 | Discharge: 2021-05-11 | Disposition: A | Discharge status: Home / Self Care | Payer: Self-pay | Attending: Student | Admitting: Student

## 2021-05-11 ENCOUNTER — Other Ambulatory Visit: Payer: Self-pay

## 2021-05-11 DIAGNOSIS — E039 Hypothyroidism, unspecified: Secondary | ICD-10-CM | POA: Insufficient documentation

## 2021-05-11 DIAGNOSIS — I159 Secondary hypertension, unspecified: Secondary | ICD-10-CM | POA: Insufficient documentation

## 2021-05-11 DIAGNOSIS — F4323 Adjustment disorder with mixed anxiety and depressed mood: Secondary | ICD-10-CM | POA: Insufficient documentation

## 2021-05-11 DIAGNOSIS — F17210 Nicotine dependence, cigarettes, uncomplicated: Secondary | ICD-10-CM | POA: Insufficient documentation

## 2021-05-11 DIAGNOSIS — Z79899 Other long term (current) drug therapy: Secondary | ICD-10-CM | POA: Insufficient documentation

## 2021-05-11 LAB — CBC WITH DIFFERENTIAL/PLATELET
Abs Immature Granulocytes: 0.01 10*3/uL (ref 0.00–0.07)
Basophils Absolute: 0.1 10*3/uL (ref 0.0–0.1)
Basophils Relative: 1 %
Eosinophils Absolute: 0.1 10*3/uL (ref 0.0–0.5)
Eosinophils Relative: 1 %
HCT: 46.9 % — ABNORMAL HIGH (ref 36.0–46.0)
Hemoglobin: 16.1 g/dL — ABNORMAL HIGH (ref 12.0–15.0)
Immature Granulocytes: 0 %
Lymphocytes Relative: 26 %
Lymphs Abs: 2 10*3/uL (ref 0.7–4.0)
MCH: 32.9 pg (ref 26.0–34.0)
MCHC: 34.3 g/dL (ref 30.0–36.0)
MCV: 95.7 fL (ref 80.0–100.0)
Monocytes Absolute: 0.4 10*3/uL (ref 0.1–1.0)
Monocytes Relative: 5 %
Neutro Abs: 5.2 10*3/uL (ref 1.7–7.7)
Neutrophils Relative %: 67 %
Platelets: 338 10*3/uL (ref 150–400)
RBC: 4.9 MIL/uL (ref 3.87–5.11)
RDW: 13.9 % (ref 11.5–15.5)
WBC: 7.8 10*3/uL (ref 4.0–10.5)
nRBC: 0 % (ref 0.0–0.2)

## 2021-05-11 LAB — URINALYSIS, ROUTINE W REFLEX MICROSCOPIC
Bilirubin Urine: NEGATIVE
Glucose, UA: NEGATIVE mg/dL
Hgb urine dipstick: NEGATIVE
Ketones, ur: 5 mg/dL — AB
Leukocytes,Ua: NEGATIVE
Nitrite: NEGATIVE
Protein, ur: 100 mg/dL — AB
Specific Gravity, Urine: 1.012 (ref 1.005–1.030)
pH: 6 (ref 5.0–8.0)

## 2021-05-11 LAB — COMPREHENSIVE METABOLIC PANEL
ALT: 12 U/L (ref 0–44)
AST: 18 U/L (ref 15–41)
Albumin: 4.1 g/dL (ref 3.5–5.0)
Alkaline Phosphatase: 70 U/L (ref 38–126)
Anion gap: 13 (ref 5–15)
BUN: 9 mg/dL (ref 8–23)
CO2: 21 mmol/L — ABNORMAL LOW (ref 22–32)
Calcium: 9 mg/dL (ref 8.9–10.3)
Chloride: 101 mmol/L (ref 98–111)
Creatinine, Ser: 1.05 mg/dL — ABNORMAL HIGH (ref 0.44–1.00)
GFR, Estimated: >60 mL/min (ref >60)
Glucose, Bld: 95 mg/dL (ref 70–99)
Potassium: 3.4 mmol/L — ABNORMAL LOW (ref 3.5–5.1)
Sodium: 135 mmol/L (ref 135–145)
Total Bilirubin: 1 mg/dL (ref 0.3–1.2)
Total Protein: 7.5 g/dL (ref 6.5–8.1)

## 2021-05-11 LAB — RAPID URINE DRUG SCREEN, HOSP PERFORMED
Amphetamines: NOT DETECTED
Barbiturates: NOT DETECTED
Benzodiazepines: NOT DETECTED
Cocaine: NOT DETECTED
Opiates: NOT DETECTED
Tetrahydrocannabinol: NOT DETECTED

## 2021-05-11 LAB — TROPONIN I (HIGH SENSITIVITY): Troponin I (High Sensitivity): 6 ng/L (ref <18)

## 2021-05-11 LAB — LIPASE, BLOOD: Lipase: 36 U/L (ref 11–51)

## 2021-05-11 LAB — TSH: TSH: 70.079 u[IU]/mL — ABNORMAL HIGH (ref 0.350–4.500)

## 2021-05-11 MED ORDER — HYDROXYZINE HCL 25 MG PO TABS
25.0000 mg | ORAL_TABLET | Freq: Three times a day (TID) | ORAL | 0 refills | Status: DC | PRN
Start: 2021-05-11 — End: 2022-11-06

## 2021-05-11 MED ORDER — LEVOTHYROXINE SODIUM 175 MCG PO TABS: 175.0000 ug | ORAL_TABLET | Freq: Every day | ORAL | 0 refills | Status: DC

## 2021-05-11 MED ORDER — HYDROCHLOROTHIAZIDE 25 MG PO TABS
25.0000 mg | ORAL_TABLET | Freq: Every day | ORAL | Status: DC
  Administered 2021-05-11: 25 mg via ORAL
  Filled 2021-05-11: qty 1

## 2021-05-11 MED ORDER — ESCITALOPRAM OXALATE 10 MG PO TABS: 10.0000 mg | ORAL_TABLET | Freq: Every day | ORAL | 0 refills | Status: DC

## 2021-05-11 MED ORDER — DICLOFENAC SODIUM 75 MG PO TBEC: 75.0000 mg | DELAYED_RELEASE_TABLET | Freq: Every day | ORAL | 5 refills | Status: DC

## 2021-05-11 MED ORDER — LOSARTAN POTASSIUM-HCTZ 100-25 MG PO TABS: 1.0000 | ORAL_TABLET | Freq: Every day | ORAL | 0 refills | Status: DC

## 2021-05-11 MED ORDER — LOSARTAN POTASSIUM-HCTZ 100-25 MG PO TABS: 1.0000 | ORAL_TABLET | Freq: Every day | ORAL | Status: DC

## 2021-05-11 MED ORDER — HYDROXYZINE HCL 25 MG PO TABS
25.0000 mg | ORAL_TABLET | Freq: Once | ORAL | Status: AC
  Administered 2021-05-11: 25 mg via ORAL
  Filled 2021-05-11: qty 1

## 2021-05-11 MED ORDER — LOSARTAN POTASSIUM 50 MG PO TABS
100.0000 mg | ORAL_TABLET | Freq: Every day | ORAL | Status: DC
  Administered 2021-05-11: 100 mg via ORAL
  Filled 2021-05-11: qty 2

## 2021-05-11 NOTE — ED Triage Notes (Signed)
Patient sent to [facility] for evaluation of hypertension, patient has been out of her antihypertensive medication for over a year and been unable to afford a PCP appointment to have it refilled. Patient is alert, oriented, and in no apparent distress, denies blurred vision and headache. ?

## 2021-05-11 NOTE — Progress Notes (Signed)
?  05/11/21 1412  ?[facility] Triage Screening (Walk-ins at [facility] only)  ?How Did You Hear About [place]? Family/Friend  ?What Is the Reason for Your Visit/Call Today? Pt presents to [facility] voluntarily with friend [name]. Pt reports that her stress stems from the possibility of pt losing her home. Pt shares that her home is under foreclosure. 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 Pt states that she is not taking any home meds and does not have a PCP. Pt does not currently have insurance. Pt presents with a high blood pressure and shares that she is willing to follow up about her medical health. Pt denies SI, HI, and AVH. Pt denies drugs or alcohol. Pt denies guns in the home and reports that her home environment is safe. Pt has a very strong support system and pt believes that she is a major caretaker for her parents emotionally. Pt?s sister overdosed and past away years ago. Pt has the stress of worrying about her aging parents, and her niece. CSW and provider Beatriz Stallion, FNP met with pt and at this time pt is routine with the recommendation is to seek medical care in the community, and follow up with outpatient mental health provider.  ?How Long Has This Been Causing You Problems? 1 wk - 1 month  ?Have You Recently Had Any Thoughts About Hurting Yourself? No  ?Are You Planning to Commit Suicide/Harm Yourself At This time? No  ?Have you Recently Had Thoughts About [place]? No  ?Are You Planning To Harm Someone At This Time? No  ?Are you currently experiencing any auditory, visual or other hallucinations? No  ?Have You Used Any Alcohol or Drugs in the Past 24 Hours? No  ?Do you have any current medical co-morbidities that require immediate attention? No  ?Clinician description of patient physical appearance/behavior: Pt presents anxious. Pt is alert and oriented x4.  ?What Do You Feel Would  Help You the Most Today? Treatment for Depression or other mood problem  ?If access to [facility] was not available, would you have sought care in the Emergency Department? Yes  ?Determination of Need Routine (7 days)  ?Options For Referral Intensive Outpatient Therapy;Medication Management  ? ?Benjaman Kindler, MSW, LCSWA ?05/11/2021 3:11 PM ? ? ?

## 2021-05-11 NOTE — ED Provider Notes (Signed)
?[facility] ?Provider Note ? ?CSN: [id number] ?Arrival date & time: 05/11/21 1623 ? ?Chief Complaint(s) ?Hypertension ? ?HPI ?Sheri Villarreal is a 61 y.o. female with PMH hypothyroidism, HTN, anxiety who presents emergency department as a transfer from behavioral health urgent care for evaluation of asymptomatic hypertension.  Patient found to have systolics greater than 166A and has been off of her blood pressure medicine for 1 year as she been unable to afford her medications.  She is also been out of her thyroid medications for similar amount of time.  She endorses increasing anxiety for which she went to the behavioral health urgent care, and endorses anxiety here in the emergency department.  She denies chest pain, shortness of breath, Donnell pain, nausea, vomiting or other systemic symptoms. ? ? ?Hypertension ? ? ?Past Medical History ?Past Medical History:  ?Diagnosis Date  ? Acute bronchitis 02/18/2016  ? Arthritis   ? knees  ? Bell's palsy   ? Carpal tunnel syndrome of right wrist 06/2013  ? Chest pain 02/24/2015  ? Dental crowns present   ? Depression   ? Essential hypertension   ? GERD (gastroesophageal reflux disease)   ? GOITER, MULTINODULAR 09/05/2007  ? Qualifier: Diagnosis of  By: Loanne Drilling MD, Jacelyn Pi   ? History of hyperthyroidism   ? Hypertension   ? under control with med., has been on med. x 9 yr.  ? Hypertensive urgency   ? under control with med., has been on med. x 9 yr.   ? HYPERTHYROIDISM 09/05/2007  ? Qualifier: Diagnosis of  By: Loanne Drilling MD, Jacelyn Pi   ? Hypothyroidism 02/24/2015  ? Pain in the chest   ? Palpitations 05/19/2017  ? Thyroid disease   ? Tobacco abuse   ? Weakness 02/18/2016  ? ?Patient Active Problem List  ? Diagnosis Date Noted  ? Palpitations 05/19/2017  ? Acute bronchitis 02/18/2016  ? Weakness 02/18/2016  ? Chest pain 02/24/2015  ? Depression 02/24/2015  ? Hypothyroidism 02/24/2015  ? GERD (gastroesophageal reflux disease)   ? Hypertensive  urgency   ? Arthritis   ? Tobacco abuse   ? Pain in the chest   ? Essential hypertension   ? GOITER, MULTINODULAR 09/05/2007  ? HYPERTHYROIDISM 09/05/2007  ? ?Home Medication(s) ?Prior to Admission medications   ?Medication Sig Start Date End Date Taking? Authorizing Provider  ?hydrOXYzine (ATARAX) 25 MG tablet Take 1 tablet (25 mg total) by mouth every 8 (eight) hours as needed for anxiety. 05/11/21  Yes Avier Jech, MD  ?acetaminophen (TYLENOL) 500 MG tablet Take 1,000 mg by mouth as needed for mild pain.     [provider]  ?azithromycin (ZITHROMAX) 250 MG tablet Take 1 tablet (250 mg total) by mouth daily. Take first 2 tablets together, then 1 every day until finished. 03/03/21   Piontek, Junie Panning, MD  ?cefdinir (OMNICEF) 300 MG capsule Take 1 capsule (300 mg total) by mouth 2 (two) times daily. 04/21/19   Jaynee Eagles, PA-C  ?diclofenac (VOLTAREN) 75 MG EC tablet Take 1 tablet (75 mg total) by mouth daily. 05/11/21   Andrews Tener, MD  ?escitalopram (LEXAPRO) 10 MG tablet Take 1 tablet (10 mg total) by mouth daily. 05/11/21   Carsyn Boster, Debe Coder, MD  ?levothyroxine (SYNTHROID) 175 MCG tablet Take 1 tablet (175 mcg total) by mouth daily before breakfast. 05/11/21   Peniel Hass, MD  ?losartan-hydrochlorothiazide (HYZAAR) 100-25 MG tablet Take 1 tablet by mouth daily. 05/11/21   Alysson Geist, Debe Coder, MD  ?Magnesium 500  MG TABS Take 500 mg by mouth daily.     [provider]  ?omeprazole (PRILOSEC) 20 MG capsule Take 20 mg by mouth daily before lunch.     [provider]  ?ondansetron (ZOFRAN-ODT) 8 MG disintegrating tablet Take 1 tablet (8 mg total) by mouth every 8 (eight) hours as needed for nausea or vomiting. 03/03/21   Lynden Oxford Scales, PA-C  ?                                                                                                                                  ?Past Surgical History ?Past Surgical History:  ?Procedure Laterality Date  ? APPENDECTOMY    ? CARPAL TUNNEL RELEASE  Right 07/04/2013  ? Procedure: RIGHT CARPAL TUNNEL RELEASE;  Surgeon: Wynonia Sours, MD;  Location: [facility];  Service: Orthopedics;  Laterality: Right;  ? HERNIA REPAIR    ? INGUINAL HERNIA REPAIR    ? KNEE ARTHROSCOPY Right   ? ?Family History ?Family History  ?Problem Relation Age of Onset  ? Hypertension Mother   ? Hypertension Father   ? Kidney disease Father   ? Diverticulosis Father   ? Diabetes Sister   ? Diabetes Brother   ? ? ?Social History ?Social History  ? ?Tobacco Use  ? Smoking status: Every Day  ?  Packs/day: 0.50  ?  Years: 30.00  ?  Pack years: 15.00  ?  Types: Cigarettes  ? Smokeless tobacco: Never  ? Tobacco comments:  ?  7 cig./day  ?Vaping Use  ? Vaping Use: Never used  ?Substance Use Topics  ? Alcohol use: Yes  ?  Comment: infrequently/rare  ? Drug use: No  ? ?Allergies ?Hydrocodone bit-homatrop mbr and Lipitor [atorvastatin] ? ?Review of Systems ?Review of Systems  ?Psychiatric/Behavioral:  The patient is nervous/anxious.   ?All other systems reviewed and are negative. ? ?Physical Exam ?Vital Signs  ?I have reviewed the triage vital signs ?BP (!) 207/121 (BP Location: Right Arm)   Pulse 87   Temp 98.5 ?F (36.9 ?C) (Oral)   Resp 16   Ht '5\' 4"'$  (1.626 m)   SpO2 99%   BMI 41.45 kg/m?  ? ?Physical Exam ?Vitals and nursing note reviewed.  ?Constitutional:   ?   General: She is not in acute distress. ?   Appearance: She is well-developed.  ?HENT:  ?   Head: Normocephalic and atraumatic.  ?Eyes:  ?   Conjunctiva/sclera: Conjunctivae normal.  ?Cardiovascular:  ?   Rate and Rhythm: Normal rate and regular rhythm.  ?   Heart sounds: No murmur heard. ?Pulmonary:  ?   Effort: Pulmonary effort is normal. No respiratory distress.  ?   Breath sounds: Normal breath sounds.  ?Abdominal:  ?   Palpations: Abdomen is soft.  ?   Tenderness: There is no abdominal tenderness.  ?Musculoskeletal:     ?   General: No swelling.  ?  Cervical back: Neck supple.  ?Skin: ?   General: Skin is warm and  dry.  ?   Capillary Refill: Capillary refill takes less than 2 seconds.  ?Neurological:  ?   Mental Status: She is alert.  ?Psychiatric:     ?   Mood and Affect: Mood normal.  ? ? ?ED Results and Treatments ?Labs ?(all labs ordered are listed, but only abnormal results are displayed) ?Labs Reviewed  ?TSH - Abnormal; Notable for the following components:  ?    Result Value  ? TSH 70.079 (*)   ? All other components within normal limits  ?COMPREHENSIVE METABOLIC PANEL - Abnormal; Notable for the following components:  ? Potassium 3.4 (*)   ? CO2 21 (*)   ? Creatinine, Ser 1.05 (*)   ? All other components within normal limits  ?CBC WITH DIFFERENTIAL/PLATELET - Abnormal; Notable for the following components:  ? Hemoglobin 16.1 (*)   ? HCT 46.9 (*)   ? All other components within normal limits  ?URINALYSIS, ROUTINE W REFLEX MICROSCOPIC - Abnormal; Notable for the following components:  ? Ketones, ur 5 (*)   ? Protein, ur 100 (*)   ? Bacteria, UA RARE (*)   ? All other components within normal limits  ?RAPID URINE DRUG SCREEN, HOSP PERFORMED  ?LIPASE, BLOOD  ?TROPONIN I (HIGH SENSITIVITY)  ?TROPONIN I (HIGH SENSITIVITY)  ?                                                                                                                       ? ?Radiology ?DG Chest 2 View ? ?Result Date: 05/11/2021 ?CLINICAL DATA:  Chest pain.  Anxiety and hypertension. EXAM: CHEST - 2 VIEW COMPARISON:  03/03/2021, CT 01/24/2018 FINDINGS: The cardiomediastinal contours are normal. There is mild peribronchial thickening. Pulmonary vasculature is normal. No consolidation, pleural effusion, or pneumothorax. No acute osseous abnormalities are seen. IMPRESSION: Mild peribronchial thickening, may be bronchitis or asthma. Electronically Signed   By: Keith Rake M.D.   On: 05/11/2021 18:12   ? ?Pertinent labs & imaging results that were available during my care of the patient were reviewed by me and considered in my medical decision making (see  MDM for details). ? ?Medications Ordered in ED ?Medications  ?losartan (COZAAR) tablet 100 mg (100 mg Oral Given 05/11/21 1935)  ?  And  ?hydrochlorothiazide (HYDRODIURIL) tablet 25 mg (25 mg Oral Given 05/11/21

## 2021-05-11 NOTE — Discharge Instructions (Signed)

## 2021-05-11 NOTE — ED Notes (Signed)
Discharge instructions reviewed with patient. Patient verbalized understanding of instructions. Follow-up care and medications were reviewed. Patient ambulatory with steady gait. VSS upon discharge.  ?

## 2021-05-11 NOTE — ED Provider Triage Note (Signed)
Emergency Medicine Provider Triage Evaluation Note ? ?Sheri Villarreal , a 61 y.o. female  was evaluated in triage.  Pt complains of anxiety and hypertension. ?She was seen at [facility] earlier today for anxiety.  She has not seen a doctor or been taking any of her medication multiple years.  She states that her visit earlier today was for anxiety.  She states that she does have some tightness in her chest which she thinks is from her anxiety.  She denies any headache or weakness. ? ? ? ?Physical Exam  ?BP (!) 220/134 (BP Location: Right Arm)   Pulse 94   Temp 98.1 ?F (36.7 ?C) (Oral)   Resp 17   SpO2 98%  ?Gen:   Awake, no distress   ?Resp:  Normal effort  ?MSK:   Moves extremities without difficulty  ?Other:  Patient has protruding eyes bilaterally ? ?Medical Decision Making  ?Medically screening exam initiated at 5:14 PM.  Appropriate orders placed.  Sheri Villarreal was informed that the remainder of the evaluation will be completed by another provider, this initial triage assessment does not replace that evaluation, and the importance of remaining in the ED until their evaluation is complete. ? ?Patient presents today with hypertension and chest discomfort. ?She has not been taking any of her medications in multiple years.  She has had her thyroid ablated and is supposed to be on levothyroxine, along with blood pressure medications.   ?As she has abnormal sensation in her chest we will check EKG, and labs.  Additionally as I suspect she will need to be restarted on antihypertensive we will check baseline labs. ?  ?Lorin Glass, PA-C ?05/11/21 1717 ? ?

## 2021-05-11 NOTE — ED Provider Notes (Signed)
Behavioral Health Urgent Care Medical Screening Exam ? ?Patient Name: Sheri Villarreal ?MRN: [id number] ?Date of Evaluation: 05/11/21 ?Chief Complaint:   ?Diagnosis:  ?Final diagnoses:  ?Adjustment disorder with mixed anxiety and depressed mood  ? ? ?History of Present illness: Sheri Villarreal is a 61 y.o. female. Patient presents voluntarily to [facility] behavioral health for walk-in assessment.  Patient is accompanied by her friend, Sheri Villarreal, who remains present during assessment. ? ?Patient reports ongoing anxiety for "many months."  She reports worsening anxiety for 1 day.  Recent stressor includes a neighbor who has made patient aware, on yesterday, that she may be evicted from her home related to something the neighbor saw online on the [employer] real estate site.  Patient reports she has faced a challenging time with her Sheri Villarreal and is concerned she may be facing foreclosure patient has been reluctant to check her mail or answer her phone when being contacted by the Sheri Villarreal after a difficult conversation with a Sheri Villarreal employee last June.  She reports the [place] employee was unkind and made her "feel small" and "I did not like the way he talk to me and I felt jerked around." ? ?Sheri Villarreal is not currently linked with outpatient psychiatry.  She reports she has been prescribed Lexapro for many years by primary care but stopped taking all of her medications approximately 2 years ago. ? ?She describes a similar situation 2 years ago and retiring from her job for many years.  She reports a new supervisor made her feel small therefore she did not adequately complete her retirement documentation and now has no Sheri Villarreal.  She stopped taking all medications including medications to address blood pressure, thyroid and other chronic healthcare conditions after she ran out of refills and realize she had no healthcare insurance approx. Two years ago. ? ?Sheri Villarreal  reports she is able to function and other areas aside from the mortgage situation and the retirement situation.  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  She also assists in care for her parents when needed. ? ?She is insightful today and verbalizes plan to follow-up with primary care as well as outpatient psychiatry.  She would like to be started on all medications and begin outpatient therapy soon as possible.  She denies history of inpatient psychiatric hospitalizations.  Family history includes a cousin who has been diagnosed with bipolar disorder and the patient's sister who had addiction disorder. ? ?Patient is assessed face-to-face by nurse practitioner.  She is seated in assessment area, no acute distress.  She is alert and oriented, pleasant and cooperative during assessment.  ?He presents with anxious mood, congruent affect. She denies suicidal and homicidal ideations.  She denies history of suicide attempts, denies history of self-harm.  She contracts verbally for safety with this Sheri Villarreal. ? ?  She has normal speech and behavior.  She denies both auditory and visual hallucinations.  Patient is able to converse coherently with goal-directed thoughts and no distractibility or preoccupation.  She denies paranoia.  Objectively there is no evidence of psychosis/mania or delusional thinking. ? ?[name] resides in [place], she denies access to weapons.  She is retired.  She denies alcohol and substance use.  Patient endorses average sleep and appetite. ? ?Patient offered support and encouragement.  She gives verbal consent to speak with her friend, Sheri Villarreal.  Patient's friend denies safety concerns and agrees with plan to follow-up with outpatient psychiatry. ? ?Patient and friend verbalized  understanding of strict return precautions.  Sheri Villarreal and Sheri Villarreal are educated and verbalize understanding of mental health resources and other crisis services in the community. She is instructed to call 911  and present to the nearest emergency room should she experience any suicidal/homicidal ideation, auditory/visual/hallucinations, or detrimental worsening of her mental health condition.   ? ? ? ?Psychiatric Specialty Exam ? ?Presentation  ?General Appearance:Appropriate for Environment; Casual ? ?Eye Contact:Good ? ?Speech:Clear and Coherent; Normal Rate ? ?Speech Volume:Normal ? ?Handedness:Right ? ? ?Mood and Affect  ?Mood:Anxious ? ?Affect:Congruent; Appropriate ? ? ?Thought Process  ?Thought Processes:Coherent; Goal Directed; Linear ? ?Descriptions of Associations:Intact ? ?Orientation:Full (Time, Place and Person) ? ?Thought Content:Logical; WDL ?   Hallucinations:None ? ?Ideas of Reference:None ? ?Suicidal Thoughts:No ? ?Homicidal Thoughts:No ? ? ?Sensorium  ?Memory:Immediate Good; Recent Good ? ?Judgment:Good ? ?Insight:Fair ? ? ?Executive Functions  ?Concentration:Good ? ?Attention Span:Good ? ?Recall:Good ? ?Fund of [place] ? ?Language:Good ? ? ?Psychomotor Activity  ?Psychomotor Activity:Normal ? ? ?Assets  ?Assets:Communication Skills; Desire for Improvement; Financial Resources/Insurance; Housing; Leisure Time; Intimacy; Physical Health; Resilience; Social Support; Talents/Skills ? ? ?Sleep  ?Sleep:Fair ? ?Number of hours: No data recorded ? ?No data recorded ? ?Physical Exam: ?Physical Exam ?Vitals and nursing note reviewed.  ?Constitutional:   ?   Appearance: Normal appearance. She is well-developed.  ?HENT:  ?   Head: Normocephalic and atraumatic.  ?   Nose: Nose normal.  ?Cardiovascular:  ?   Rate and Rhythm: Normal rate.  ?Pulmonary:  ?   Effort: Pulmonary effort is normal.  ?Musculoskeletal:     ?   General: Normal range of motion.  ?   Cervical back: Normal range of motion.  ?Neurological:  ?   Mental Status: She is alert and oriented to person, place, and time.  ?Psychiatric:     ?   Attention and Perception: Attention and perception normal.     ?   Mood and Affect: Affect normal. Mood is  anxious.     ?   Speech: Speech normal.     ?   Behavior: Behavior normal. Behavior is cooperative.     ?   Thought Content: Thought content normal.     ?   Cognition and Memory: Cognition and memory normal.     ?   Judgment: Judgment normal.  ? ?Review of Systems  ?Constitutional: Negative.   ?HENT: Negative.    ?Eyes: Negative.   ?Respiratory: Negative.    ?Cardiovascular: Negative.   ?Gastrointestinal: Negative.   ?Genitourinary: Negative.   ?Musculoskeletal: Negative.   ?Skin: Negative.   ?Neurological: Negative.   ?Endo/Heme/Allergies: Negative.   ?Psychiatric/Behavioral:  The patient is nervous/anxious.   ?Blood pressure (!) 192/140, temperature 98.1 ?F (36.7 ?C), temperature source Oral, resp. rate 19, SpO2 100 %. There is no height or weight on file to calculate BMI. ? ?Musculoskeletal: ?Strength & Muscle Tone: within normal limits ?Gait & Station: normal ?Patient leans: N/A ? ? ?[facility] MSE Discharge Disposition for Follow up and Recommendations: ?Based on my evaluation the patient appears to have an emergency medical condition for which I recommend the patient be transferred to the emergency department for further evaluation.  ?Patient reviewed with Dr. Serafina Mitchell. ?Pooja is reluctant to be seen in emergency department related to elevated blood pressure.  She is encouraged repeatedly by this provider as well as the treatment team to seek evaluation related to elevated blood pressure today.  She endorses some nausea, believes this is related to anxiety.  She  denies headache, denies vomiting, denies blurry vision, denies dizziness.  Reviewed importance of emergency department evaluation, discussed potential outcomes if not evaluated, up to and including CVA or death.  Patient verbalizes understanding. ?She reports she does not have medication remaining at home and she may seek evaluation at the emergency department or she may return home and begin her scheduled medication. ? ?Follow-up with outpatient psychiatry,  resources provided.  Follow-up with primary care provider, resources provided. ? ? ?Lucky Rathke, FNP ?05/11/2021, 3:56 PM ? ?

## 2021-05-11 NOTE — ED Notes (Signed)
Discharge instructions provided and Pt stated understanding. Pt alert, orient and ambulatory prior to d/c from facility. Personal belongings returned. Safety maintained.  

## 2021-05-11 NOTE — ED Notes (Signed)
Pt stated she needs a refill for Levothyroxine medication. Pt plans to go to the [facility] to find a PCP. ?

## 2021-06-10 ENCOUNTER — Telehealth (HOSPITAL_COMMUNITY): Payer: Self-pay

## 2021-06-10 NOTE — BH Assessment (Signed)
Care Management - [facility] Follow Up Discharges  ? ?Writer attempted to make contact with patient today and was unsuccessful.  Patient voicemail was not set up.  ? ?Per chart review, patient was provided with outpatient resources. ? ?

## 2022-09-22 ENCOUNTER — Encounter: Payer: Self-pay | Admitting: Physician Assistant

## 2022-09-22 ENCOUNTER — Ambulatory Visit: Payer: Self-pay | Admitting: Physician Assistant

## 2022-09-22 VITALS — BP 213/123 | HR 70 | Ht 64.0 in | Wt 174.0 lb

## 2022-09-22 DIAGNOSIS — F411 Generalized anxiety disorder: Secondary | ICD-10-CM | POA: Insufficient documentation

## 2022-09-22 DIAGNOSIS — E039 Hypothyroidism, unspecified: Secondary | ICD-10-CM

## 2022-09-22 DIAGNOSIS — I10 Essential (primary) hypertension: Secondary | ICD-10-CM

## 2022-09-22 DIAGNOSIS — I160 Hypertensive urgency: Secondary | ICD-10-CM

## 2022-09-22 DIAGNOSIS — Z13220 Encounter for screening for lipoid disorders: Secondary | ICD-10-CM

## 2022-09-22 DIAGNOSIS — E876 Hypokalemia: Secondary | ICD-10-CM

## 2022-09-22 DIAGNOSIS — K219 Gastro-esophageal reflux disease without esophagitis: Secondary | ICD-10-CM

## 2022-09-22 DIAGNOSIS — Z789 Other specified health status: Secondary | ICD-10-CM

## 2022-09-22 DIAGNOSIS — E782 Mixed hyperlipidemia: Secondary | ICD-10-CM

## 2022-09-22 MED ORDER — ESCITALOPRAM OXALATE 10 MG PO TABS: 10.0000 mg | ORAL_TABLET | Freq: Every day | ORAL | 1 refills | Status: DC

## 2022-09-22 MED ORDER — LEVOTHYROXINE SODIUM 50 MCG PO TABS: 50.0000 ug | ORAL_TABLET | Freq: Every day | ORAL | 1 refills | Status: DC

## 2022-09-22 MED ORDER — CLONIDINE HCL 0.1 MG PO TABS
0.1000 mg | ORAL_TABLET | Freq: Once | ORAL | Status: AC
  Administered 2022-09-22: 0.1 mg via ORAL

## 2022-09-22 MED ORDER — LOSARTAN POTASSIUM-HCTZ 100-25 MG PO TABS: 1.0000 | ORAL_TABLET | Freq: Every day | ORAL | 1 refills | Status: DC

## 2022-09-22 NOTE — Progress Notes (Signed)
New Patient Office Visit  Subjective    Patient ID: Sheri Villarreal, female    DOB: Mar 14, 1960  Age: 62 y.o. MRN: [id number]  CC:  Chief Complaint  Patient presents with   Medication Refill    Pt has been out of medications for over a year    Establish Care    HPI Sheri Villarreal states that she has been out of all of her medications for slightly over a year.  States that she unfortunately lost her insurance.  States that she does not check her blood pressure at home, denies hypertensive symptoms.  States that she continues to experience anxiety, states that she did feel the Lexapro offered relief.  States that she has lost approximately 50 pounds in the last year.  States that she had an episode of COVID, and after that lost her taste for chocolate.  States that she is eating much less at each meal and not snacking in between.  States that she feels her knee pain has improved with the weight loss.  States that she still will experience some arthritic pain, states that she will use acetaminophen with mild relief.  States that she takes over-the-counter generic Prilosec on a daily basis, states that controls her GERD symptoms.       Outpatient Encounter Medications as of 09/22/2022  Medication Sig   acetaminophen (TYLENOL) 500 MG tablet Take 1,000 mg by mouth as needed for mild pain.    diclofenac (VOLTAREN) 75 MG EC tablet Take 1 tablet (75 mg total) by mouth daily.   hydrOXYzine (ATARAX) 25 MG tablet Take 1 tablet (25 mg total) by mouth every 8 (eight) hours as needed for anxiety.   Magnesium 500 MG TABS Take 500 mg by mouth daily.    omeprazole (PRILOSEC) 20 MG capsule Take 20 mg by mouth daily before lunch.    azithromycin (ZITHROMAX) 250 MG tablet Take 1 tablet (250 mg total) by mouth daily. Take first 2 tablets together, then 1 every day until finished. (Patient not taking: Reported on 09/22/2022)   cefdinir (OMNICEF) 300 MG capsule Take 1 capsule (300 mg total)  by mouth 2 (two) times daily. (Patient not taking: Reported on 09/22/2022)   escitalopram (LEXAPRO) 10 MG tablet Take 1 tablet (10 mg total) by mouth daily.   levothyroxine (SYNTHROID) 50 MCG tablet Take 1 tablet (50 mcg total) by mouth daily before breakfast.   losartan-hydrochlorothiazide (HYZAAR) 100-25 MG tablet Take 1 tablet by mouth daily.   ondansetron (ZOFRAN-ODT) 8 MG disintegrating tablet Take 1 tablet (8 mg total) by mouth every 8 (eight) hours as needed for nausea or vomiting. (Patient not taking: Reported on 09/22/2022)   [DISCONTINUED] escitalopram (LEXAPRO) 10 MG tablet Take 1 tablet (10 mg total) by mouth daily. (Patient not taking: Reported on 09/22/2022)   [DISCONTINUED] levothyroxine (SYNTHROID) 175 MCG tablet Take 1 tablet (175 mcg total) by mouth daily before breakfast. (Patient not taking: Reported on 09/22/2022)   [DISCONTINUED] losartan-hydrochlorothiazide (HYZAAR) 100-25 MG tablet Take 1 tablet by mouth daily. (Patient not taking: Reported on 09/22/2022)   [EXPIRED] cloNIDine (CATAPRES) tablet 0.1 mg    No facility-administered encounter medications on file as of 09/22/2022.    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 x 9 yr.   Hypertensive urgency    under control with med., has been on med. x 9 yr.    HYPERTHYROIDISM 09/05/2007   Qualifier: Diagnosis of  By: Everardo All MD, Sean A    Hypothyroidism 02/24/2015   Pain in the chest    Palpitations 05/19/2017   Thyroid disease    Tobacco abuse    Weakness 02/18/2016    Past Surgical History:  Procedure  Laterality Date   APPENDECTOMY     CARPAL TUNNEL RELEASE Right 07/04/2013   Procedure: RIGHT CARPAL TUNNEL RELEASE;  Surgeon: Nicki Reaper, MD;  Location: [facility];  Service: Orthopedics;  Laterality: Right;   HERNIA REPAIR     INGUINAL HERNIA REPAIR     KNEE ARTHROSCOPY Right     Family History  Problem Relation Age of Onset   Hypertension Mother    Hypertension Father    Kidney disease Father    Diverticulosis Father    Diabetes Sister    Diabetes Brother     Social History   Socioeconomic History   Marital status: Single    Spouse name: Not on file   Number of children: Not on file   Years of education: Not on file   Highest education level: Some college, no degree  Occupational History   Not on file  Tobacco Use   Smoking status: Every Day    Current packs/day: 0.50    Average packs/day: 0.5 packs/day for 30.0 years (15.0 ttl pk-yrs)    Types: Cigarettes   Smokeless tobacco: Never   Tobacco comments:    7 cig./day  Vaping Use   Vaping status: Never Used  Substance and Sexual Activity   Alcohol use: Yes    Comment: infrequently/rare   Drug use: No   Sexual activity: Not on file  Other Topics Concern   Not on file  Social History Narrative   Lives alone   Social Determinants of Health   Financial Resource Strain: Not on file  Food Insecurity: Not on file  Transportation Needs: Not on file  Physical Activity: Not on file  Stress: Not on file  Social Connections: Not on file  Intimate Partner Violence: Not on file    Review of Systems  Constitutional: Negative.   HENT: Negative.    Eyes:  Negative for blurred vision.  Respiratory:  Negative for shortness of breath.   Cardiovascular:  Negative for chest pain.  Gastrointestinal: Negative.   Genitourinary: Negative.   Musculoskeletal: Negative.   Skin: Negative.   Neurological:  Negative for dizziness, weakness and headaches.  Endo/Heme/Allergies: Negative.   Psychiatric/Behavioral:   Negative for depression. The patient is nervous/anxious. The patient does not have insomnia.         Objective    BP (!) 213/123   Pulse 70   Ht 5\' 4"  (1.626 m)   Wt 174 lb (78.9 kg)   BMI 29.87 kg/m   Physical Exam Vitals and nursing note reviewed.  Constitutional:      General: She is not in acute distress.    Appearance: Normal appearance.  HENT:     Head: Normocephalic and atraumatic.     Right Ear: External ear normal.     Left Ear: External  ear normal.     Nose: Nose normal.     Mouth/Throat:     Mouth: Mucous membranes are moist.     Pharynx: Oropharynx is clear.  Eyes:     Extraocular Movements: Extraocular movements intact.     Conjunctiva/sclera: Conjunctivae normal.     Pupils: Pupils are equal, round, and reactive to light.  Cardiovascular:     Rate and Rhythm: Normal rate and regular rhythm.     Pulses: Normal pulses.     Heart sounds: Normal heart sounds.  Pulmonary:     Effort: Pulmonary effort is normal.     Breath sounds: Normal breath sounds.  Musculoskeletal:        General: Normal range of motion.     Cervical back: Normal range of motion.  Skin:    General: Skin is warm and dry.  Neurological:     General: No focal deficit present.     Mental Status: She is alert.  Psychiatric:        Mood and Affect: Mood normal.        Behavior: Behavior normal.        Thought Content: Thought content normal.        Judgment: Judgment normal.       Assessment & Plan:   Problem List Items Addressed This Visit       Cardiovascular and Mediastinum   Hypertensive urgency   Relevant Medications   losartan-hydrochlorothiazide (HYZAAR) 100-25 MG tablet   Essential hypertension - Primary   Relevant Medications   losartan-hydrochlorothiazide (HYZAAR) 100-25 MG tablet   Other Relevant Orders   CBC with Differential/Platelet   Comp. Metabolic Panel (12)     Digestive   GERD (gastroesophageal reflux disease)     Endocrine   Hypothyroidism    Relevant Medications   levothyroxine (SYNTHROID) 50 MCG tablet   Other Relevant Orders   Thyroid Panel With TSH     Other   GAD (generalized anxiety disorder)   Relevant Medications   escitalopram (LEXAPRO) 10 MG tablet   Other Visit Diagnoses     Screening, lipid       Relevant Orders   Lipid panel      1. Essential hypertension Resume previous regimen.  Patient encouraged to check blood pressure at home, keep a written log and have available for all office visits.  Patient has appointment to establish care on September 6 at patient care center.  Patient encouraged to return the mobile unit as needed, red flags given for prompt reevaluation.  Fasting labs completed today.  Patient declines referral for mammogram at this time, wants to wait until her insurance is in place. - CBC with Differential/Platelet - Comp. Metabolic Panel (12) - losartan-hydrochlorothiazide (HYZAAR) 100-25 MG tablet; Take 1 tablet by mouth daily.  Dispense: 30 tablet; Refill: 1  2. Hypertensive urgency Red flags given for prompt reevaluation - cloNIDine (CATAPRES) tablet 0.1 mg  3. Hypothyroidism, unspecified type Restart Synthroid, at 50 mcg.  Patient understands titration necessary and repeat labs completed in 6 weeks. - Thyroid Panel With TSH - levothyroxine (SYNTHROID) 50 MCG tablet; Take 1 tablet (50 mcg total) by mouth daily before breakfast.  Dispense: 30 tablet; Refill: 1  4. GAD (generalized anxiety disorder) Restart Lexapro, patient does not want to restart hydroxyzine at this time.  Patient declines pain.  Patient education given on coping skills - escitalopram (LEXAPRO) 10 MG tablet; Take 1 tablet (10 mg total) by mouth daily.  Dispense: 30 tablet; Refill:  1  5. Gastroesophageal reflux disease without esophagitis Continue over-the-counter regimen  6. Screening, lipid  - Lipid panel   I have reviewed the patient's medical history (PMH, PSH, Social History, Family History,  Medications, and allergies) , and have been updated if relevant. I spent 40 minutes reviewing chart and  face to face time with patient.    Return if symptoms worsen or fail to improve.   Kasandra Knudsen Mayers, PA-C

## 2022-09-22 NOTE — Patient Instructions (Signed)
You are going to restart your blood pressure medication, I encourage you to check your blood pressure at home, keep a written log and have available for all office visits.  You are going to restart your thyroid medication, but at a lower dose.  You will have this rechecked in 6 weeks.  You are going to restart Lexapro.  If you decide that you would like to speak with a counselor, please let us know and we will be happy to start the referral for you.  I encourage you to increase your hydration, you should be drinking at least 64 ounces of water a day.  Roney Jaffe, PA-C Physician Assistant [facility] [email or web address]   How to Take Your Blood Pressure Blood pressure is a measurement of how strongly your blood is pressing against the walls of your arteries. Arteries are blood vessels that carry blood from your heart throughout your body. Your health care provider takes your blood pressure at each office visit. You can also take your own blood pressure at home with a blood pressure monitor. You may need to take your own blood pressure to: Confirm a diagnosis of high blood pressure (hypertension). Monitor your blood pressure over time. Make sure your blood pressure medicine is working. Supplies needed: Blood pressure monitor. A chair to sit in. This should be a chair where you can sit upright with your back supported. Do not sit on a soft couch or an armchair. Table or desk. Small notebook and pencil or pen. How to prepare To get the most accurate reading, avoid the following for 30 minutes before you check your blood pressure: Drinking caffeine. Drinking alcohol. Eating. Smoking. Exercising. Five minutes before you check your blood pressure: Use the bathroom and urinate so that you have an empty bladder. Sit quietly in a chair. Do not talk. How to take your blood pressure To check your blood pressure, follow the  instructions in the manual that came with your blood pressure monitor. If you have a digital blood pressure monitor, the instructions may be as follows: Sit up straight in a chair. Place your feet on the floor. Do not cross your ankles or legs. Rest your left arm at the level of your heart on a table or desk or on the arm of a chair. Pull up your shirt sleeve. Wrap the blood pressure cuff around the upper part of your left arm, 1 inch (2.5 cm) above your elbow. It is best to wrap the cuff around bare skin. Fit the cuff snugly, but not too tightly, around your arm. You should be able to place only one finger between the cuff and your arm. Position the cord so that it rests in the bend of your elbow. Press the power button. Sit quietly while the cuff inflates and deflates. Read the digital reading on the monitor screen and write the numbers down (record them) in a notebook. Wait 2-3 minutes, then repeat the steps, starting at step 1. What does my blood pressure reading mean? A blood pressure reading consists of a higher number over a lower number. Ideally, your blood pressure should be below 120/80. The first ("top") number is called the systolic pressure. It is a measure of the pressure in your arteries as your heart beats. The second ("bottom") number is called the diastolic pressure. It is a measure of the pressure in your arteries as the heart relaxes. Blood pressure is classified into four stages. The following are the stages for adults  who do not have a short-term serious illness or a chronic condition. Systolic pressure and diastolic pressure are measured in a unit called mm Hg (millimeters of mercury).  Normal Systolic pressure: below 120. Diastolic pressure: below 80. Elevated Systolic pressure: 120-129. Diastolic pressure: below 80. Hypertension stage 1 Systolic pressure: 130-139. Diastolic pressure: 80-89. Hypertension stage 2 Systolic pressure: 140 or above. Diastolic pressure: 90  or above. You can have elevated blood pressure or hypertension even if only the systolic or only the diastolic number in your reading is higher than normal. Follow these instructions at home: Medicines Take over-the-counter and prescription medicines only as told by your health care provider. Tell your health care provider if you are having any side effects from blood pressure medicine. General instructions Check your blood pressure as often as recommended by your health care provider. Check your blood pressure at the same time every day. Take your monitor to the next appointment with your health care provider to make sure that: You are using it correctly. It provides accurate readings. Understand what your goal blood pressure numbers are. Keep all follow-up visits. This is important. General tips Your health care provider can suggest a reliable monitor that will meet your needs. There are several types of home blood pressure monitors. Choose a monitor that has an arm cuff. Do not choose a monitor that measures your blood pressure from your wrist or finger. Choose a cuff that wraps snugly, not too tight or too loose, around your upper arm. You should be able to fit only one finger between your arm and the cuff. You can buy a blood pressure monitor at most drugstores or online. Where to find more information American Heart Association: www.heart.org Contact a health care provider if: Your blood pressure is consistently high. Your blood pressure is suddenly low. Get help right away if: Your systolic blood pressure is higher than 180. Your diastolic blood pressure is higher than 120. These symptoms may be an emergency. Get help right away. Call 911. Do not wait to see if the symptoms will go away. Do not drive yourself to the hospital. Summary Blood pressure is a measurement of how strongly your blood is pressing against the walls of your arteries. A blood pressure reading consists of a  higher number over a lower number. Ideally, your blood pressure should be below 120/80. Check your blood pressure at the same time every day. Avoid caffeine, alcohol, smoking, and exercise for 30 minutes prior to checking your blood pressure. These agents can affect the accuracy of the blood pressure reading. This information is not intended to replace advice given to you by your health care provider. Make sure you discuss any questions you have with your health care provider. Document Revised: 10/24/2020 Document Reviewed: 10/24/2020 Elsevier Patient Education  2024 [employer].

## 2022-09-23 MED ORDER — EZETIMIBE 10 MG PO TABS: 10.0000 mg | ORAL_TABLET | Freq: Every day | ORAL | 1 refills | Status: DC

## 2022-09-23 NOTE — Addendum Note (Signed)
Addended by: Roney Jaffe on: 09/23/2022 03:13 PM   Modules accepted: Orders

## 2022-10-30 ENCOUNTER — Ambulatory Visit: Payer: Self-pay | Admitting: Nurse Practitioner

## 2022-10-31 ENCOUNTER — Ambulatory Visit (HOSPITAL_COMMUNITY)
Admission: EM | Admit: 2022-10-31 | Discharge: 2022-10-31 | Disposition: A | Discharge status: Home / Self Care | Payer: Self-pay | Attending: Urgent Care | Admitting: Urgent Care

## 2022-10-31 ENCOUNTER — Emergency Department (HOSPITAL_COMMUNITY): Payer: Self-pay

## 2022-10-31 ENCOUNTER — Other Ambulatory Visit: Payer: Self-pay

## 2022-10-31 ENCOUNTER — Emergency Department (HOSPITAL_COMMUNITY)
Admission: EM | Admit: 2022-10-31 | Discharge: 2022-11-01 | Disposition: A | Discharge status: Home / Self Care | Payer: Self-pay | Attending: Emergency Medicine | Admitting: Emergency Medicine

## 2022-10-31 ENCOUNTER — Encounter (HOSPITAL_COMMUNITY): Payer: Self-pay | Admitting: Emergency Medicine

## 2022-10-31 ENCOUNTER — Encounter (HOSPITAL_COMMUNITY): Payer: Self-pay

## 2022-10-31 DIAGNOSIS — I10 Essential (primary) hypertension: Secondary | ICD-10-CM | POA: Insufficient documentation

## 2022-10-31 DIAGNOSIS — E876 Hypokalemia: Secondary | ICD-10-CM | POA: Insufficient documentation

## 2022-10-31 DIAGNOSIS — R1032 Left lower quadrant pain: Secondary | ICD-10-CM | POA: Insufficient documentation

## 2022-10-31 DIAGNOSIS — R110 Nausea: Secondary | ICD-10-CM

## 2022-10-31 DIAGNOSIS — E039 Hypothyroidism, unspecified: Secondary | ICD-10-CM | POA: Insufficient documentation

## 2022-10-31 LAB — COMPREHENSIVE METABOLIC PANEL
ALT: 15 U/L (ref 0–44)
AST: 18 U/L (ref 15–41)
Albumin: 4.5 g/dL (ref 3.5–5.0)
Alkaline Phosphatase: 74 U/L (ref 38–126)
Anion gap: 12 (ref 5–15)
BUN: 12 mg/dL (ref 8–23)
CO2: 25 mmol/L (ref 22–32)
Calcium: 9.2 mg/dL (ref 8.9–10.3)
Chloride: 93 mmol/L — ABNORMAL LOW (ref 98–111)
Creatinine, Ser: 0.78 mg/dL (ref 0.44–1.00)
GFR, Estimated: >60 mL/min (ref >60)
Glucose, Bld: 107 mg/dL — ABNORMAL HIGH (ref 70–99)
Potassium: 2.8 mmol/L — ABNORMAL LOW (ref 3.5–5.1)
Sodium: 130 mmol/L — ABNORMAL LOW (ref 135–145)
Total Bilirubin: 0.7 mg/dL (ref 0.3–1.2)
Total Protein: 8.1 g/dL (ref 6.5–8.1)

## 2022-10-31 LAB — PROTIME-INR
INR: 1 (ref 0.8–1.2)
Prothrombin Time: 13.1 s (ref 11.4–15.2)

## 2022-10-31 LAB — APTT: aPTT: 29 s (ref 24–36)

## 2022-10-31 LAB — CBC
HCT: 43 % (ref 36.0–46.0)
Hemoglobin: 15.2 g/dL — ABNORMAL HIGH (ref 12.0–15.0)
MCH: 34.5 pg — ABNORMAL HIGH (ref 26.0–34.0)
MCHC: 35.3 g/dL (ref 30.0–36.0)
MCV: 97.7 fL (ref 80.0–100.0)
Platelets: 393 10*3/uL (ref 150–400)
RBC: 4.4 MIL/uL (ref 3.87–5.11)
RDW: 12.6 % (ref 11.5–15.5)
WBC: 7.7 10*3/uL (ref 4.0–10.5)
nRBC: 0 % (ref 0.0–0.2)

## 2022-10-31 LAB — URINALYSIS, W/ REFLEX TO CULTURE (INFECTION SUSPECTED)
Bacteria, UA: NONE SEEN
Bilirubin Urine: NEGATIVE
Glucose, UA: NEGATIVE mg/dL
Ketones, ur: 5 mg/dL — AB
Leukocytes,Ua: NEGATIVE
Nitrite: NEGATIVE
Protein, ur: 100 mg/dL — AB
Specific Gravity, Urine: 1.035 — ABNORMAL HIGH (ref 1.005–1.030)
pH: 6 (ref 5.0–8.0)

## 2022-10-31 LAB — POCT URINALYSIS DIP (MANUAL ENTRY)
Glucose, UA: NEGATIVE mg/dL
Leukocytes, UA: NEGATIVE
Nitrite, UA: NEGATIVE
Protein Ur, POC: 100 mg/dL — AB
Spec Grav, UA: 1.02 (ref 1.010–1.025)
Urobilinogen, UA: 0.2 U/dL
pH, UA: 6 (ref 5.0–8.0)

## 2022-10-31 LAB — LIPASE, BLOOD: Lipase: 50 U/L (ref 11–51)

## 2022-10-31 LAB — I-STAT CG4 LACTIC ACID, ED: Lactic Acid, Venous: 1 mmol/L (ref 0.5–1.9)

## 2022-10-31 MED ORDER — HYDROCHLOROTHIAZIDE 25 MG PO TABS
25.0000 mg | ORAL_TABLET | Freq: Once | ORAL | Status: AC
  Administered 2022-10-31: 25 mg via ORAL
  Filled 2022-10-31: qty 1

## 2022-10-31 MED ORDER — LOSARTAN POTASSIUM 25 MG PO TABS
100.0000 mg | ORAL_TABLET | Freq: Every day | ORAL | Status: DC
  Administered 2022-11-01: 100 mg via ORAL
  Filled 2022-10-31: qty 4

## 2022-10-31 MED ORDER — LABETALOL HCL 5 MG/ML IV SOLN
20.0000 mg | Freq: Once | INTRAVENOUS | Status: AC
  Administered 2022-10-31: 20 mg via INTRAVENOUS
  Filled 2022-10-31: qty 4

## 2022-10-31 MED ORDER — LOSARTAN POTASSIUM 25 MG PO TABS: 100.0000 mg | ORAL_TABLET | Freq: Every day | ORAL | Status: DC

## 2022-10-31 MED ORDER — POTASSIUM CHLORIDE CRYS ER 20 MEQ PO TBCR
40.0000 meq | EXTENDED_RELEASE_TABLET | Freq: Once | ORAL | 0 refills | Status: DC
Start: 2022-10-31 — End: 2022-11-06

## 2022-10-31 MED ORDER — IOHEXOL 300 MG/ML  SOLN
100.0000 mL | Freq: Once | INTRAMUSCULAR | Status: AC | PRN
  Administered 2022-10-31: 100 mL via INTRAVENOUS

## 2022-10-31 MED ORDER — ONDANSETRON HCL 4 MG PO TABS: 4.0000 mg | ORAL_TABLET | Freq: Four times a day (QID) | ORAL | 0 refills | Status: DC

## 2022-10-31 MED ORDER — LACTATED RINGERS IV BOLUS
500.0000 mL | Freq: Once | INTRAVENOUS | Status: AC
  Administered 2022-10-31: 500 mL via INTRAVENOUS

## 2022-10-31 MED ORDER — HYDROCHLOROTHIAZIDE 25 MG PO TABS
25.0000 mg | ORAL_TABLET | Freq: Once | ORAL | Status: DC
  Filled 2022-10-31: qty 1

## 2022-10-31 MED ORDER — POTASSIUM CHLORIDE CRYS ER 20 MEQ PO TBCR
40.0000 meq | EXTENDED_RELEASE_TABLET | Freq: Once | ORAL | Status: AC
  Administered 2022-10-31: 40 meq via ORAL
  Filled 2022-10-31: qty 2

## 2022-10-31 NOTE — ED Triage Notes (Signed)
Pt presents to the office for LLQ pain x 3 days. Pt reports fatigue and nausea.

## 2022-10-31 NOTE — ED Provider Notes (Signed)
[facility]    CSN: [id number] Arrival date & time: 10/31/22  1326      History   Chief Complaint Chief Complaint  Patient presents with   Flank Pain    HPI Sheri Villarreal is a 62 y.o. female.   62 year old female presents today due to concerns of left lower quadrant pain.  She states it started on Thursday.  It is localized to one particular region of the left lower quadrant without radiation.  She reports it is sharp and comes and intermittent spasms.  Today it is severe.  She also felt as though she had a fever the other day, took her temperature and it stopped at 100.3.  She has had significant nausea and inability to tolerate anything in her stomach.  She has a history of hypertension for which she takes medication, but she has not been able to keep any medications down due to her recurrent nausea.  She also reports some change in her urination, feels that she has been having some hesitancy and difficulty with urinating, but denies pain with urination or hematuria.  She denies diarrhea or constipation.  She denies a rash.  She does have a history of appendectomy and hernia repair, no other GI history.  Patient did try over-the-counter Tylenol, but it did not help but she believes she did not keep it down.   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 x 9 yr.   Hypertensive urgency    under control with med., has been on med. x 9 yr.    HYPERTHYROIDISM 09/05/2007   Qualifier: Diagnosis of  By: Everardo All MD, Sean A    Hypothyroidism  02/24/2015   Pain in the chest    Palpitations 05/19/2017   Thyroid disease    Tobacco abuse    Weakness 02/18/2016    Patient Active Problem List   Diagnosis Date Noted   GAD (generalized anxiety disorder) 09/22/2022   Palpitations 05/19/2017   Acute bronchitis 02/18/2016   Weakness 02/18/2016   Chest pain 02/24/2015   Depression 02/24/2015   Hypothyroidism 02/24/2015   GERD (gastroesophageal reflux disease)    Hypertensive urgency    Arthritis    Tobacco abuse    Pain in the chest    Essential hypertension    GOITER, MULTINODULAR 09/05/2007   HYPERTHYROIDISM 09/05/2007    Past Surgical History:  Procedure Laterality Date   APPENDECTOMY     CARPAL TUNNEL RELEASE Right 07/04/2013   Procedure: RIGHT CARPAL TUNNEL RELEASE;  Surgeon: Nicki Reaper, MD;  Location: [facility];  Service: Orthopedics;  Laterality: Right;   HERNIA REPAIR     INGUINAL HERNIA REPAIR     KNEE ARTHROSCOPY Right     OB History   No obstetric history on file.      Home Medications    Prior to Admission medications   Medication Sig Start Date End Date Taking? Authorizing Provider  acetaminophen (TYLENOL)  500 MG tablet Take 1,000 mg by mouth as needed for mild pain.     [provider]  azithromycin (ZITHROMAX) 250 MG tablet Take 1 tablet (250 mg total) by mouth daily. Take first 2 tablets together, then 1 every day until finished. Patient not taking: Reported on 09/22/2022 03/03/21   Jannifer Franklin, MD  cefdinir (OMNICEF) 300 MG capsule Take 1 capsule (300 mg total) by mouth 2 (two) times daily. Patient not taking: Reported on 09/22/2022 04/21/19   Wallis Bamberg, PA-C  diclofenac (VOLTAREN) 75 MG EC tablet Take 1 tablet (75 mg total) by mouth daily. 05/11/21   Kommor, Madison, MD  escitalopram (LEXAPRO) 10 MG tablet Take 1 tablet (10 mg total) by mouth daily. 09/22/22   Mayers, Cari S, PA-C  ezetimibe (ZETIA) 10 MG tablet Take 1 tablet (10 mg total) by mouth daily. 09/23/22   Mayers,  Cari S, PA-C  hydrOXYzine (ATARAX) 25 MG tablet Take 1 tablet (25 mg total) by mouth every 8 (eight) hours as needed for anxiety. 05/11/21   Kommor, Wyn Forster, MD  levothyroxine (SYNTHROID) 50 MCG tablet Take 1 tablet (50 mcg total) by mouth daily before breakfast. 09/22/22   Mayers, Cari S, PA-C  losartan-hydrochlorothiazide (HYZAAR) 100-25 MG tablet Take 1 tablet by mouth daily. 09/22/22   Mayers, Cari S, PA-C  Magnesium 500 MG TABS Take 500 mg by mouth daily.     [provider]  omeprazole (PRILOSEC) 20 MG capsule Take 20 mg by mouth daily before lunch.     [provider]  ondansetron (ZOFRAN-ODT) 8 MG disintegrating tablet Take 1 tablet (8 mg total) by mouth every 8 (eight) hours as needed for nausea or vomiting. Patient not taking: Reported on 09/22/2022 03/03/21   Theadora Rama Scales, PA-C    Family History Family History  Problem Relation Age of Onset   Hypertension Mother    Hypertension Father    Kidney disease Father    Diverticulosis Father    Diabetes Sister    Diabetes Brother     Social History Social History   Tobacco Use   Smoking status: Every Day    Current packs/day: 0.50    Average packs/day: 0.5 packs/day for 30.0 years (15.0 ttl pk-yrs)    Types: Cigarettes   Smokeless tobacco: Never   Tobacco comments:    7 cig./day  Vaping Use   Vaping status: Never Used  Substance Use Topics   Alcohol use: Yes    Comment: infrequently/rare   Drug use: No     Allergies   Hydrocodone bit-homatrop mbr and Lipitor [atorvastatin]   Review of Systems Review of Systems  Genitourinary:  Positive for flank pain.  As per HPI   Physical Exam Triage Vital Signs ED Triage Vitals [10/31/22 1439]  Encounter Vitals Group     BP (!) 202/139     Systolic BP Percentile      Diastolic BP Percentile      Pulse Rate 89     Resp 18     Temp 98.8 F (37.1 C)     Temp Source Oral     SpO2 97 %     Weight      Height      Head Circumference      Peak  Flow      Pain Score      Pain Loc      Pain Education      Exclude from Growth Chart    No data found.  Updated Vital Signs BP (!) 202/139 (BP Location: Left Arm)   Pulse 89   Temp 98.8 F (37.1 C) (Oral)   Resp 18   SpO2 97%   Visual Acuity Right Eye Distance:   Left Eye Distance:   Bilateral Distance:    Right Eye Near:   Left Eye Near:    Bilateral Near:     Physical Exam Vitals and nursing note reviewed. Exam conducted with a chaperone present.  Constitutional:      General: She is in acute distress (uncomfortable).     Appearance: Normal appearance. She is not toxic-appearing.  HENT:     Head: Normocephalic and atraumatic.  Eyes:     Comments: B exophthalmos with upper eyelid edema  Cardiovascular:     Rate and Rhythm: Normal rate.  Pulmonary:     Effort: Pulmonary effort is normal. No respiratory distress.  Abdominal:     General: Abdomen is flat. A surgical scar is present. Bowel sounds are normal. There is no distension. There are no signs of injury.     Palpations: Abdomen is soft. There is no hepatomegaly or splenomegaly.     Tenderness: There is abdominal tenderness in the left lower quadrant. There is rebound.     Hernia: No hernia is present.    Neurological:     Mental Status: She is alert.      UC Treatments / Results  Labs (all labs ordered are listed, but only abnormal results are displayed) Labs Reviewed  POCT URINALYSIS DIP (MANUAL ENTRY) - Abnormal; Notable for the following components:      Result Value   Bilirubin, UA moderate (*)    Ketones, POC UA small (15) (*)    Blood, UA small (*)    Protein Ur, POC =100 (*)    All other components within normal limits    EKG   Radiology No results found.  Procedures Procedures (including critical care time)  Medications Ordered in UC Medications - No data to display  Initial Impression / Assessment and Plan / UC Course  I have reviewed the triage vital signs and the nursing  notes.  Pertinent labs & imaging results that were available during my care of the patient were reviewed by me and considered in my medical decision making (see chart for details).     Severe LLQ pain - pt with severe LLQ pain, nausea, vomiting and inability to tolerate food x or medications 2 days. Pt also with possible fever and urinary sx. No hx of the same. Ultimately, pt needs to have further workup, likely imaging, labs and pain medication which exceeds the capacity of the urgent care. It was recommended pt head to the ER immediately for further workup. Differentials include diverticulitis, obstructive uropathy, ovarian torsion, incarcerated hernia, volvulus, etc. Pt offered EMS transport due to pain level and declined. Pt understands need for additional ER workup.     Final Clinical Impressions(s) / UC Diagnoses   Final diagnoses:  LLQ pain     Discharge Instructions      Please head to [facility] ER now for a further workup as your needs exceed the capacity of the urgent care.      ED Prescriptions   None    PDMP not reviewed this encounter.   Maretta Bees, [place] 10/31/22 1535

## 2022-10-31 NOTE — ED Provider Notes (Signed)
[facility] AT [facility] Provider Note   CSN: [id number] Arrival date & time: 10/31/22  1539     History {Add pertinent medical, surgical, social history, OB history to HPI:1} Chief Complaint  Patient presents with   Groin Pain    [name] is a 63 y.o. female.   Groin Pain       Home Medications Prior to Admission medications   Medication Sig Start Date End Date Taking? Authorizing Provider  acetaminophen (TYLENOL) 500 MG tablet Take 1,000 mg by mouth as needed for mild pain.     [provider]  azithromycin (ZITHROMAX) 250 MG tablet Take 1 tablet (250 mg total) by mouth daily. Take first 2 tablets together, then 1 every day until finished. Patient not taking: Reported on 09/22/2022 03/03/21   Jannifer Franklin, MD  cefdinir (OMNICEF) 300 MG capsule Take 1 capsule (300 mg total) by mouth 2 (two) times daily. Patient not taking: Reported on 09/22/2022 04/21/19   Wallis Bamberg, PA-C  diclofenac (VOLTAREN) 75 MG EC tablet Take 1 tablet (75 mg total) by mouth daily. 05/11/21   Kommor, Madison, MD  escitalopram (LEXAPRO) 10 MG tablet Take 1 tablet (10 mg total) by mouth daily. 09/22/22   Mayers, Cari S, PA-C  ezetimibe (ZETIA) 10 MG tablet Take 1 tablet (10 mg total) by mouth daily. 09/23/22   Mayers, Cari S, PA-C  hydrOXYzine (ATARAX) 25 MG tablet Take 1 tablet (25 mg total) by mouth every 8 (eight) hours as needed for anxiety. 05/11/21   Kommor, Wyn Forster, MD  levothyroxine (SYNTHROID) 50 MCG tablet Take 1 tablet (50 mcg total) by mouth daily before breakfast. 09/22/22   Mayers, Cari S, PA-C  losartan-hydrochlorothiazide (HYZAAR) 100-25 MG tablet Take 1 tablet by mouth daily. 09/22/22   Mayers, Cari S, PA-C  Magnesium 500 MG TABS Take 500 mg by mouth daily.     [provider]  omeprazole (PRILOSEC) 20 MG capsule Take 20 mg by mouth daily before lunch.     [provider]  ondansetron (ZOFRAN-ODT) 8 MG disintegrating tablet Take  1 tablet (8 mg total) by mouth every 8 (eight) hours as needed for nausea or vomiting. Patient not taking: Reported on 09/22/2022 03/03/21   Theadora Rama Scales, PA-C      Allergies    Hydrocodone bit-homatrop mbr and Lipitor [atorvastatin]    Review of Systems   Review of Systems  Physical Exam Updated Vital Signs BP (!) 218/128   Pulse 82   Temp 98.5 F (36.9 C) (Oral)   Resp 18   SpO2 100%  Physical Exam  ED Results / Procedures / Treatments   Labs (all labs ordered are listed, but only abnormal results are displayed) Labs Reviewed  CULTURE, BLOOD (ROUTINE X 2)  CULTURE, BLOOD (ROUTINE X 2)  LIPASE, BLOOD  COMPREHENSIVE METABOLIC PANEL  CBC  URINALYSIS, ROUTINE W REFLEX MICROSCOPIC  PROTIME-INR  APTT  URINALYSIS, W/ REFLEX TO CULTURE (INFECTION SUSPECTED)  I-STAT CG4 LACTIC ACID, ED    EKG None  Radiology No results found.  Procedures Procedures  {Document cardiac monitor, telemetry assessment procedure when appropriate:1}  Medications Ordered in ED Medications - No data to display  ED Course/ Medical Decision Making/ A&P   {   Click here for ABCD2, HEART and other calculatorsREFRESH Note before signing :1}  Medical Decision Making Amount and/or Complexity of Data Reviewed Labs: ordered. Radiology: ordered. ECG/medicine tests: ordered.   ***  {Document critical care time when appropriate:1} {Document review of labs and clinical decision tools ie heart score, Chads2Vasc2 etc:1}  {Document your independent review of radiology images, and any outside records:1} {Document your discussion with family members, caretakers, and with consultants:1} {Document social determinants of health affecting pt's care:1} {Document your decision making why or why not admission, treatments were needed:1} Final Clinical Impression(s) / ED Diagnoses Final diagnoses:  None    Rx / DC Orders ED Discharge Orders     None

## 2022-10-31 NOTE — ED Triage Notes (Signed)
Pt sent from UC with reports of left side groin pain that started on Thursday. Pt reports low grade fever while at home.

## 2022-10-31 NOTE — Discharge Instructions (Addendum)
Please head to [facility] ER now for a further workup as your needs exceed the capacity of the urgent care.

## 2022-11-01 MED ORDER — METHOCARBAMOL 500 MG PO TABS: 500.0000 mg | ORAL_TABLET | Freq: Two times a day (BID) | ORAL | 0 refills | Status: DC

## 2022-11-01 MED ORDER — ALUM & MAG HYDROXIDE-SIMETH 200-200-20 MG/5ML PO SUSP
30.0000 mL | Freq: Once | ORAL | Status: AC
  Administered 2022-11-01: 30 mL via ORAL
  Filled 2022-11-01: qty 30

## 2022-11-01 NOTE — Discharge Instructions (Addendum)
You were seen in the ER for evaluation of your abdominal pain. Your imaging showed that you have some uterine fibroids. I think your pain is likely muscular from your leg stretches. For this, I recommend taking your Tylenol 1000mg  every 6 hours as needed for pain. You can also try OTC lidocaine patches to the area. Additionally, I am going to prescribe you a muscle relaxer to take as needed. Please do not drive or operate heavy machinery as this will make you sleepy. I am also sending you home with some Zofran for nausea for you to take as needed. Your potassium was also low, I am sending you home with a a few days of potassium. This will need to be rechecked by your PCP in the week. Please make sure you are staying compliant with your BP medications. I am giving you a BP form for you to log your blood pressures in. Please bring this with you to your next PCP visit. If you have any concerns, new or worsening symptoms, please return to the nearest ER for re-evaluation.   Contact a doctor if: Your belly pain changes or gets worse. You have very bad cramping or bloating in your belly. You vomit. Your pain gets worse with meals, after eating, or with certain foods. You have trouble pooping or have watery poop for more than 2-3 days. You are not hungry, or you lose weight without trying. You have signs of not getting enough fluid or water (dehydration). These may include: Dark pee, very little pee, or no pee. Cracked lips or dry mouth. Feeling sleepy or weak. You have pain when you pee or poop. Your belly pain wakes you up at night. You have blood in your pee. You have a fever. Get help right away if: You cannot stop vomiting. Your pain is only in one part of your belly, like on the right side. You have bloody or black poop, or poop that looks like tar. You have trouble breathing. You have chest pain. These symptoms may be an emergency. Get help right away. Call 911. Do not wait to see if the  symptoms will go away. Do not drive yourself to the hospital.

## 2022-11-02 LAB — URINE CULTURE: Culture: <10000 — AB

## 2022-11-03 ENCOUNTER — Other Ambulatory Visit: Payer: Self-pay

## 2022-11-03 ENCOUNTER — Inpatient Hospital Stay (HOSPITAL_COMMUNITY)
Admission: EM | Admit: 2022-11-03 | Discharge: 2022-11-06 | DRG: 641 | Disposition: A | Discharge status: Home / Self Care | Payer: Self-pay | Attending: Internal Medicine | Admitting: Internal Medicine

## 2022-11-03 ENCOUNTER — Encounter (HOSPITAL_COMMUNITY): Payer: Self-pay | Admitting: *Deleted

## 2022-11-03 ENCOUNTER — Observation Stay (HOSPITAL_COMMUNITY): Payer: Self-pay

## 2022-11-03 DIAGNOSIS — M5136 Other intervertebral disc degeneration, lumbar region: Secondary | ICD-10-CM | POA: Diagnosis present

## 2022-11-03 DIAGNOSIS — Z888 Allergy status to other drugs, medicaments and biological substances status: Secondary | ICD-10-CM

## 2022-11-03 DIAGNOSIS — Z7982 Long term (current) use of aspirin: Secondary | ICD-10-CM

## 2022-11-03 DIAGNOSIS — M549 Dorsalgia, unspecified: Secondary | ICD-10-CM | POA: Insufficient documentation

## 2022-11-03 DIAGNOSIS — Z9049 Acquired absence of other specified parts of digestive tract: Secondary | ICD-10-CM

## 2022-11-03 DIAGNOSIS — Z885 Allergy status to narcotic agent status: Secondary | ICD-10-CM

## 2022-11-03 DIAGNOSIS — M47816 Spondylosis without myelopathy or radiculopathy, lumbar region: Secondary | ICD-10-CM | POA: Diagnosis present

## 2022-11-03 DIAGNOSIS — I160 Hypertensive urgency: Secondary | ICD-10-CM | POA: Diagnosis present

## 2022-11-03 DIAGNOSIS — M4316 Spondylolisthesis, lumbar region: Secondary | ICD-10-CM | POA: Diagnosis present

## 2022-11-03 DIAGNOSIS — E871 Hypo-osmolality and hyponatremia: Principal | ICD-10-CM | POA: Diagnosis present

## 2022-11-03 DIAGNOSIS — Z833 Family history of diabetes mellitus: Secondary | ICD-10-CM

## 2022-11-03 DIAGNOSIS — F32A Depression, unspecified: Secondary | ICD-10-CM | POA: Diagnosis present

## 2022-11-03 DIAGNOSIS — E039 Hypothyroidism, unspecified: Secondary | ICD-10-CM | POA: Diagnosis present

## 2022-11-03 DIAGNOSIS — K219 Gastro-esophageal reflux disease without esophagitis: Secondary | ICD-10-CM | POA: Diagnosis present

## 2022-11-03 DIAGNOSIS — Z8249 Family history of ischemic heart disease and other diseases of the circulatory system: Secondary | ICD-10-CM

## 2022-11-03 DIAGNOSIS — M5440 Lumbago with sciatica, unspecified side: Secondary | ICD-10-CM

## 2022-11-03 DIAGNOSIS — Z841 Family history of disorders of kidney and ureter: Secondary | ICD-10-CM

## 2022-11-03 DIAGNOSIS — F411 Generalized anxiety disorder: Secondary | ICD-10-CM | POA: Diagnosis present

## 2022-11-03 DIAGNOSIS — F17210 Nicotine dependence, cigarettes, uncomplicated: Secondary | ICD-10-CM | POA: Diagnosis present

## 2022-11-03 DIAGNOSIS — I7143 Infrarenal abdominal aortic aneurysm, without rupture: Secondary | ICD-10-CM | POA: Diagnosis present

## 2022-11-03 DIAGNOSIS — R1032 Left lower quadrant pain: Secondary | ICD-10-CM | POA: Diagnosis present

## 2022-11-03 DIAGNOSIS — Z79899 Other long term (current) drug therapy: Secondary | ICD-10-CM

## 2022-11-03 DIAGNOSIS — I10 Essential (primary) hypertension: Secondary | ICD-10-CM | POA: Diagnosis present

## 2022-11-03 DIAGNOSIS — E876 Hypokalemia: Secondary | ICD-10-CM | POA: Diagnosis present

## 2022-11-03 DIAGNOSIS — Z79890 Hormone replacement therapy (postmenopausal): Secondary | ICD-10-CM

## 2022-11-03 LAB — URINALYSIS, ROUTINE W REFLEX MICROSCOPIC
Bacteria, UA: NONE SEEN
Bilirubin Urine: NEGATIVE
Glucose, UA: NEGATIVE mg/dL
Ketones, ur: 5 mg/dL — AB
Leukocytes,Ua: NEGATIVE
Nitrite: NEGATIVE
Protein, ur: NEGATIVE mg/dL
Specific Gravity, Urine: 1.003 — ABNORMAL LOW (ref 1.005–1.030)
pH: 7 (ref 5.0–8.0)

## 2022-11-03 LAB — CBC
HCT: 42.1 % (ref 36.0–46.0)
Hemoglobin: 14.9 g/dL (ref 12.0–15.0)
MCH: 34.1 pg — ABNORMAL HIGH (ref 26.0–34.0)
MCHC: 35.4 g/dL (ref 30.0–36.0)
MCV: 96.3 fL (ref 80.0–100.0)
Platelets: 409 10*3/uL — ABNORMAL HIGH (ref 150–400)
RBC: 4.37 MIL/uL (ref 3.87–5.11)
RDW: 12 % (ref 11.5–15.5)
WBC: 8.2 10*3/uL (ref 4.0–10.5)
nRBC: 0 % (ref 0.0–0.2)

## 2022-11-03 LAB — COMPREHENSIVE METABOLIC PANEL
ALT: 15 U/L (ref 0–44)
AST: 18 U/L (ref 15–41)
Albumin: 4.3 g/dL (ref 3.5–5.0)
Alkaline Phosphatase: 61 U/L (ref 38–126)
Anion gap: 13 (ref 5–15)
BUN: 13 mg/dL (ref 8–23)
CO2: 24 mmol/L (ref 22–32)
Calcium: 8.9 mg/dL (ref 8.9–10.3)
Chloride: 87 mmol/L — ABNORMAL LOW (ref 98–111)
Creatinine, Ser: 0.85 mg/dL (ref 0.44–1.00)
GFR, Estimated: >60 mL/min (ref >60)
Glucose, Bld: 101 mg/dL — ABNORMAL HIGH (ref 70–99)
Potassium: 2.9 mmol/L — ABNORMAL LOW (ref 3.5–5.1)
Sodium: 124 mmol/L — ABNORMAL LOW (ref 135–145)
Total Bilirubin: 0.9 mg/dL (ref 0.3–1.2)
Total Protein: 7.9 g/dL (ref 6.5–8.1)

## 2022-11-03 LAB — LIPASE, BLOOD: Lipase: 49 U/L (ref 11–51)

## 2022-11-03 LAB — MAGNESIUM: Magnesium: 2.2 mg/dL (ref 1.7–2.4)

## 2022-11-03 MED ORDER — OMEPRAZOLE MAGNESIUM 20 MG PO TBEC: 20.0000 mg | DELAYED_RELEASE_TABLET | Freq: Every day | ORAL | Status: DC

## 2022-11-03 MED ORDER — LABETALOL HCL 5 MG/ML IV SOLN: 5.0000 mg | INTRAVENOUS | Status: DC | PRN

## 2022-11-03 MED ORDER — SODIUM CHLORIDE 0.9 % IV BOLUS
1000.0000 mL | Freq: Once | INTRAVENOUS | Status: AC
  Administered 2022-11-03: 1000 mL via INTRAVENOUS

## 2022-11-03 MED ORDER — ENOXAPARIN SODIUM 40 MG/0.4ML IJ SOSY
40.0000 mg | PREFILLED_SYRINGE | INTRAMUSCULAR | Status: DC
  Administered 2022-11-03 – 2022-11-05 (×3): 40 mg via SUBCUTANEOUS
  Filled 2022-11-03 (×3): qty 0.4

## 2022-11-03 MED ORDER — POTASSIUM CHLORIDE CRYS ER 20 MEQ PO TBCR
40.0000 meq | EXTENDED_RELEASE_TABLET | Freq: Once | ORAL | Status: AC
  Administered 2022-11-03: 40 meq via ORAL
  Filled 2022-11-03: qty 2

## 2022-11-03 MED ORDER — HYDROCHLOROTHIAZIDE 25 MG PO TABS
25.0000 mg | ORAL_TABLET | Freq: Every day | ORAL | Status: DC
  Administered 2022-11-04: 25 mg via ORAL
  Filled 2022-11-03: qty 1

## 2022-11-03 MED ORDER — ASPIRIN 81 MG PO TBEC
81.0000 mg | DELAYED_RELEASE_TABLET | Freq: Every day | ORAL | Status: DC
  Administered 2022-11-03 – 2022-11-06 (×4): 81 mg via ORAL
  Filled 2022-11-03 (×4): qty 1

## 2022-11-03 MED ORDER — ESCITALOPRAM OXALATE 10 MG PO TABS
10.0000 mg | ORAL_TABLET | Freq: Every day | ORAL | Status: DC
  Administered 2022-11-04: 10 mg via ORAL
  Filled 2022-11-03: qty 1

## 2022-11-03 MED ORDER — PANTOPRAZOLE SODIUM 40 MG PO TBEC
40.0000 mg | DELAYED_RELEASE_TABLET | Freq: Every day | ORAL | Status: DC
  Administered 2022-11-04 – 2022-11-06 (×3): 40 mg via ORAL
  Filled 2022-11-03 (×3): qty 1

## 2022-11-03 MED ORDER — LOSARTAN POTASSIUM 50 MG PO TABS
100.0000 mg | ORAL_TABLET | Freq: Every day | ORAL | Status: DC
  Administered 2022-11-04: 100 mg via ORAL
  Filled 2022-11-03: qty 2

## 2022-11-03 MED ORDER — SODIUM CHLORIDE 0.9 % IV SOLN: INTRAVENOUS | Status: AC

## 2022-11-03 MED ORDER — LOSARTAN POTASSIUM-HCTZ 100-25 MG PO TABS: 1.0000 | ORAL_TABLET | Freq: Every day | ORAL | Status: DC

## 2022-11-03 MED ORDER — HYDROCODONE-ACETAMINOPHEN 5-325 MG PO TABS
1.0000 | ORAL_TABLET | Freq: Four times a day (QID) | ORAL | Status: DC | PRN
  Administered 2022-11-04 – 2022-11-06 (×4): 1 via ORAL
  Filled 2022-11-03 (×4): qty 1

## 2022-11-03 MED ORDER — LEVOTHYROXINE SODIUM 50 MCG PO TABS
50.0000 ug | ORAL_TABLET | Freq: Every day | ORAL | Status: DC
  Administered 2022-11-04: 50 ug via ORAL
  Filled 2022-11-03: qty 1

## 2022-11-03 MED ORDER — EZETIMIBE 10 MG PO TABS
10.0000 mg | ORAL_TABLET | Freq: Every day | ORAL | Status: DC
  Administered 2022-11-04 – 2022-11-06 (×3): 10 mg via ORAL
  Filled 2022-11-03 (×3): qty 1

## 2022-11-03 MED ORDER — MAGNESIUM GLUCONATE 500 MG PO TABS
500.0000 mg | ORAL_TABLET | Freq: Every day | ORAL | Status: DC
  Administered 2022-11-03 – 2022-11-05 (×3): 500 mg via ORAL
  Filled 2022-11-03 (×3): qty 1

## 2022-11-03 MED ORDER — ACETAMINOPHEN 500 MG PO TABS
1000.0000 mg | ORAL_TABLET | Freq: Four times a day (QID) | ORAL | Status: DC | PRN
  Administered 2022-11-03 – 2022-11-06 (×4): 1000 mg via ORAL
  Filled 2022-11-03 (×4): qty 2

## 2022-11-03 MED ORDER — POTASSIUM CHLORIDE 10 MEQ/100ML IV SOLN
10.0000 meq | INTRAVENOUS | Status: AC
  Administered 2022-11-03 (×3): 10 meq via INTRAVENOUS
  Filled 2022-11-03 (×3): qty 100

## 2022-11-03 NOTE — Assessment & Plan Note (Signed)
-  Replete with oral and IV potassium

## 2022-11-03 NOTE — Assessment & Plan Note (Signed)
-  continue home SSRI

## 2022-11-03 NOTE — Assessment & Plan Note (Addendum)
-  elevated. Reports compliance with medication -continue losartan-hydrochlorothiazide -PRN labetalol with perimeters.

## 2022-11-03 NOTE — ED Triage Notes (Signed)
Pt was seen on Sat and treated for abd pain, pain continues with back, left flank and lower abd pain. Nausea and dry heaves.

## 2022-11-03 NOTE — Assessment & Plan Note (Signed)
continue levothyroxine

## 2022-11-03 NOTE — ED Notes (Signed)
ED TO INPATIENT HANDOFF REPORT  Name/Age/Gender Sheri Villarreal 62 y.o. female  Code Status    Code Status Orders  (From admission, onward)           Start     Ordered   11/03/22 2004  Full code  Continuous       Question:  By:  Answer:  Consent: discussion documented in EHR   11/03/22 2003           Code Status History     Date Active Date Inactive Code Status Order ID Comments User Context   01/24/2018 2144 01/25/2018 1846 Full Code [id number]  Clydie Braun, MD ED   02/18/2016 0007 02/19/2016 1658 Full Code [id number]  Eduard Clos, MD ED   02/24/2015 2009 02/25/2015 1716 Full Code [id number]  Lorretta Harp, MD ED       Home/SNF/Other Home  Chief Complaint Hyponatremia [E87.1]  Level of Care/Admitting Diagnosis ED Disposition     ED Disposition  Admit   Condition  --   Comment  Hospital Area: [facility] [[id number]]  Level of Care: Telemetry [5]  Admit to tele based on following criteria: Other see comments  Comments: rate  May place patient in observation at [facility] or [facility] if equivalent level of care is available:: No  Covid Evaluation: Asymptomatic - no recent exposure (last 10 days) testing not required  Diagnosis: Hyponatremia [198519]  Admitting Physician: Anselm Jungling [[id number]]  Attending Physician: Anselm Jungling [[id number]]          Medical History Past Medical History:  Diagnosis Date   Acute bronchitis 02/18/2016   Arthritis    knees   Bell's palsy    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 x 9 yr.   Hypertensive urgency    under control with med., has been on med. x 9 yr.    HYPERTHYROIDISM  09/05/2007   Qualifier: Diagnosis of  By: Everardo All MD, Sean A    Hypothyroidism 02/24/2015   Pain in the chest    Palpitations 05/19/2017   Thyroid disease    Tobacco abuse    Weakness 02/18/2016    Allergies Allergies  Allergen Reactions   Hydrocodone Bit-Homatrop Mbr Itching and Other (See Comments)    Reaction to Hycodan cough syrup (Patient has taken Norco in the past with NO reaction)   Lipitor [Atorvastatin] Other (See Comments)    Leg pain   Robaxin [Methocarbamol] Other (See Comments)    Developed sweating (forehead) shortly after taking; uncomfortable feeling    IV Location/Drains/Wounds Patient Lines/Drains/Airways Status     Active Line/Drains/Airways     Name Placement date Placement time Site Days   Peripheral IV 11/03/22 20 G 1" Left Antecubital 11/03/22  1751  Antecubital  less than 1            Labs/Imaging Results for orders placed or performed during the hospital encounter of 11/03/22 (from the past 48 hour(s))  Lipase, blood     Status: None   Collection Time: 11/03/22  2:50 PM  Result Value Ref Range   Lipase 49  11 - 51 U/L    Comment: Performed at [facility], [address] [place], [place] [address]  Comprehensive metabolic panel     Status: Abnormal   Collection Time: 11/03/22  2:50 PM  Result Value Ref Range   Sodium 124 (L) 135 - 145 mmol/L   Potassium 2.9 (L) 3.5 - 5.1 mmol/L   Chloride 87 (L) 98 - 111 mmol/L   CO2 24 22 - 32 mmol/L   Glucose, Bld 101 (H) 70 - 99 mg/dL    Comment: Glucose reference range applies only to samples taken after fasting for at least 8 hours.   BUN 13 8 - 23 mg/dL   Creatinine, Ser 2.02 0.44 - 1.00 mg/dL   Calcium 8.9 8.9 - 54.2 mg/dL   Total Protein 7.9 6.5 - 8.1 g/dL   Albumin 4.3 3.5 - 5.0 g/dL   AST 18 15 - 41 U/L   ALT 15 0 - 44 U/L   Alkaline Phosphatase 61 38 - 126 U/L   Total Bilirubin 0.9 0.3 - 1.2 mg/dL   GFR, Estimated >70 >62 mL/min    Comment: (NOTE) Calculated using the  CKD-EPI Creatinine Equation (2021)    Anion gap 13 5 - 15    Comment: Performed at [facility], [address], [place], [place] [address]  CBC     Status: Abnormal   Collection Time: 11/03/22  2:50 PM  Result Value Ref Range   WBC 8.2 4.0 - 10.5 K/uL   RBC 4.37 3.87 - 5.11 MIL/uL   Hemoglobin 14.9 12.0 - 15.0 g/dL   HCT 31.5 17.6 - 16.0 %   MCV 96.3 80.0 - 100.0 fL   MCH 34.1 (H) 26.0 - 34.0 pg   MCHC 35.4 30.0 - 36.0 g/dL   RDW 73.7 10.6 - 26.9 %   Platelets 409 (H) 150 - 400 K/uL   nRBC 0.0 0.0 - 0.2 %    Comment: Performed at [facility], [address] [place], [place] [address]  Urinalysis, Routine w reflex microscopic -Urine, Unspecified Source     Status: Abnormal   Collection Time: 11/03/22  9:01 PM  Result Value Ref Range   Color, Urine STRAW (A) YELLOW   APPearance CLEAR CLEAR   Specific Gravity, Urine 1.003 (L) 1.005 - 1.030   pH 7.0 5.0 - 8.0   Glucose, UA NEGATIVE NEGATIVE mg/dL   Hgb urine dipstick SMALL (A) NEGATIVE   Bilirubin Urine NEGATIVE NEGATIVE   Ketones, ur 5 (A) NEGATIVE mg/dL   Protein, ur NEGATIVE NEGATIVE mg/dL   Nitrite NEGATIVE NEGATIVE   Leukocytes,Ua NEGATIVE NEGATIVE   RBC / HPF 0-5 0 - 5 RBC/hpf   WBC, UA 0-5 0 - 5 WBC/hpf   Bacteria, UA NONE SEEN NONE SEEN   Squamous Epithelial / HPF 0-5 0 - 5 /HPF    Comment: Performed at [facility], [address] [place], [place] [address]   US Pelvis Complete  Result Date: 11/03/2022 CLINICAL DATA:  Left lower quadrant pain. EXAM: TRANSABDOMINAL AND TRANSVAGINAL ULTRASOUND OF PELVIS DOPPLER ULTRASOUND OF OVARIES TECHNIQUE: Both transabdominal and transvaginal ultrasound examinations of the pelvis were performed. Transabdominal technique was performed for global imaging of the pelvis including uterus, ovaries, adnexal regions, and pelvic cul-de-sac. It was necessary to proceed with endovaginal exam following the transabdominal exam to visualize  the uterus, endometrium, bilateral ovaries and bilateral adnexa. Color and duplex Doppler ultrasound was utilized to evaluate blood flow to  the ovaries. COMPARISON:  None Available. FINDINGS: Uterus Measurements: 7.6 cm x 3.2 cm x 6.0 cm = volume: 77.3 mL. A 4.0 cm x 3.3 cm x 3.3 cm heterogeneous uterine fibroid is seen within the uterine fundus on the right. Endometrium Thickness: 16.6 mm.  No focal abnormality visualized. Right ovary Measurements: 2.0 cm x 1.4 cm x 1.7 cm = volume: 2.5 mL. Normal appearance/no adnexal mass. Left ovary Measurements: 2.0 cm x 1.3 cm x 1.7 cm = volume: 2.3 mL. Normal appearance/no adnexal mass. Pulsed Doppler evaluation of both ovaries demonstrates normal low-resistance arterial and venous waveforms. Other findings No abnormal free fluid. IMPRESSION: Heterogeneous uterine fibroid. Electronically Signed   By: Aram Candela M.D.   On: 11/03/2022 21:12   US PELVIC DOPPLER (TORSION R/O OR MASS ARTERIAL FLOW)  Result Date: 11/03/2022 CLINICAL DATA:  Left lower quadrant pain. EXAM: TRANSABDOMINAL AND TRANSVAGINAL ULTRASOUND OF PELVIS DOPPLER ULTRASOUND OF OVARIES TECHNIQUE: Both transabdominal and transvaginal ultrasound examinations of the pelvis were performed. Transabdominal technique was performed for global imaging of the pelvis including uterus, ovaries, adnexal regions, and pelvic cul-de-sac. It was necessary to proceed with endovaginal exam following the transabdominal exam to visualize the uterus, endometrium, bilateral ovaries and bilateral adnexa. Color and duplex Doppler ultrasound was utilized to evaluate blood flow to the ovaries. COMPARISON:  None Available. FINDINGS: Uterus Measurements: 7.6 cm x 3.2 cm x 6.0 cm = volume: 77.3 mL. A 4.0 cm x 3.3 cm x 3.3 cm heterogeneous uterine fibroid is seen within the uterine fundus on the right. Endometrium Thickness: 16.6 mm.  No focal abnormality visualized. Right ovary Measurements: 2.0 cm x 1.4 cm x 1.7 cm = volume: 2.5 mL.  Normal appearance/no adnexal mass. Left ovary Measurements: 2.0 cm x 1.3 cm x 1.7 cm = volume: 2.3 mL. Normal appearance/no adnexal mass. Pulsed Doppler evaluation of both ovaries demonstrates normal low-resistance arterial and venous waveforms. Other findings No abnormal free fluid. IMPRESSION: Heterogeneous uterine fibroid. Electronically Signed   By: Aram Candela M.D.   On: 11/03/2022 21:12    Pending Labs Unresulted Labs (From admission, onward)     Start     Ordered   11/04/22 0500  CBC  Tomorrow morning,   R        11/03/22 2003   11/04/22 0500  Basic metabolic panel  Tomorrow morning,   R        11/03/22 2003   11/03/22 2004  HIV Antibody (routine testing w rflx)  (HIV Antibody (Routine testing w reflex) panel)  Once,   R        11/03/22 2003   11/03/22 1902  Magnesium  Once,   STAT        11/03/22 1901            Vitals/Pain Today's Vitals   11/03/22 1439 11/03/22 1442 11/03/22 1752 11/03/22 1800  BP:  (!) 166/104 (!) 138/103   Pulse:  84 69   Resp:   10   Temp:  98.1 F (36.7 C)  98 F (36.7 C)  TempSrc:  Oral    SpO2:  100% 99%   Weight: 173 lb (78.5 kg)     Height: 5\' 4"  (1.626 m)     PainSc: 4        Isolation Precautions No active isolations  Medications Medications  potassium chloride 10 mEq in 100 mL IVPB (10 mEq Intravenous New Bag/Given 11/03/22 2113)  enoxaparin (LOVENOX) injection 40 mg (40 mg Subcutaneous Given 11/03/22 2110)  acetaminophen (TYLENOL) tablet 1,000 mg (has no administration in time range)  aspirin EC tablet 81 mg (81 mg Oral Given 11/03/22 2110)  ezetimibe (ZETIA) tablet 10 mg (has no administration in time range)  escitalopram (LEXAPRO) tablet 10 mg (has no administration in time range)  levothyroxine (SYNTHROID) tablet 50 mcg (has no administration in time range)  magnesium gluconate (MAGONATE) tablet 500 mg (has no administration in time range)  losartan (COZAAR) tablet 100 mg (has no administration in time range)    And   hydrochlorothiazide (HYDRODIURIL) tablet 25 mg (has no administration in time range)  pantoprazole (PROTONIX) EC tablet 40 mg (has no administration in time range)  labetalol (NORMODYNE) injection 5 mg (has no administration in time range)  0.9 %  sodium chloride infusion ( Intravenous New Bag/Given 11/03/22 2131)  HYDROcodone-acetaminophen (NORCO/VICODIN) 5-325 MG per tablet 1 tablet (has no administration in time range)  sodium chloride 0.9 % bolus 1,000 mL (1,000 mLs Intravenous New Bag/Given 11/03/22 1751)  potassium chloride SA (KLOR-CON M) CR tablet 40 mEq (40 mEq Oral Given 11/03/22 2109)    Mobility walks

## 2022-11-03 NOTE — H&P (Signed)
History and Physical    Patient: Sheri Villarreal GEX:[id number] DOB: 08-05-1960 DOA: 11/03/2022 DOS: the patient was seen and examined on 11/03/2022 PCP: Pcp, No  Patient coming from: Home  Chief Complaint:  Chief Complaint  Patient presents with   Abdominal Pain   Back Pain   Flank Pain   HPI: Sheri Villarreal is a 62 y.o. female with medical history significant of hypertension,  hypothyroidism, anxiety and depression who presents with left lower quadrant abdominal pain.  Symptoms started 5 days ago with crampy left lower quadrant abdominal pain. Feels like menstrual cramps which she no longer has. Pain usually worse at night. She feels better when she lays on her left side and worse on right side. Since pain started has had decrease oral intake due to low appetite and pain. She presented to ED on 9/7 and had CT abdomen/pelvis with ill-defined and partially calcified uterine fibroids. No adnexal mass. She was treated with IV fluids, potassium replacement and GI cocktail and discharged.   Pain intensified last night when she was sleeping and brought her almost to brink of tears. Denies nausea, vomiting, diarrhea or constipation.   Has hx of appendectomy and hernia repair in her college years. No vaginal pain, discharge or bleeding.   In the ED, afebrile, BP 166/104 on room air.   CBC with no leucocytosis or anemia.   CMP with hyponatremia of 124, hypokalemia of 2.9, Cl of 87, CO2 24, glucose of 101 and creatinine of 0.85.  Pt was given IV fluids in ED and pelvic ultrasound was pending.   Review of Systems: As mentioned in the history of present illness. All other systems reviewed and are negative. 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 x 9 yr.   Hypertensive urgency    under control with med., has been on med. x 9 yr.    HYPERTHYROIDISM 09/05/2007   Qualifier: Diagnosis of  By: Everardo All MD, Sean A    Hypothyroidism 02/24/2015   Pain in the chest    Palpitations 05/19/2017   Thyroid disease    Tobacco abuse    Weakness 02/18/2016   Past Surgical History:  Procedure Laterality Date   APPENDECTOMY     CARPAL TUNNEL RELEASE Right 07/04/2013   Procedure: RIGHT CARPAL TUNNEL RELEASE;  Surgeon: Nicki Reaper, MD;  Location: [facility];  Service: Orthopedics;  Laterality: Right;   HERNIA REPAIR     INGUINAL HERNIA REPAIR     KNEE ARTHROSCOPY Right    Social History:  reports that she has been smoking cigarettes. She has a 15 pack-year smoking history. She has never used smokeless tobacco. She reports current alcohol use. She reports that she does not use drugs.  Allergies  Allergen Reactions   Hydrocodone Bit-Homatrop Mbr Itching and Other (See Comments)    Reaction to Hycodan cough syrup (Patient has taken Norco in the past with NO reaction)   Lipitor [Atorvastatin] Other (See Comments)  Leg pain   Robaxin [Methocarbamol] Other (See Comments)    Developed sweating (forehead) shortly after taking; uncomfortable feeling    Family History  Problem Relation Age of Onset   Hypertension Mother    Hypertension Father    Kidney disease Father    Diverticulosis Father    Diabetes Sister    Diabetes Brother     Prior to Admission medications   Medication Sig Start Date End Date Taking? Authorizing Provider  acetaminophen (TYLENOL) 500 MG tablet Take 1,000 mg by mouth as needed for mild pain.     [provider]  amLODipine (NORVASC) 10 MG tablet Take 10 mg by mouth daily. 02/25/15   [provider]  aspirin  EC 81 MG tablet Take 81 mg by mouth daily. 02/25/15   [provider]  azithromycin (ZITHROMAX) 250 MG tablet Take 1 tablet (250 mg total) by mouth daily. Take first 2 tablets together, then 1 every day until finished. Patient not taking: Reported on 09/22/2022 03/03/21   Jannifer Franklin, MD  cefdinir (OMNICEF) 300 MG capsule Take 1 capsule (300 mg total) by mouth 2 (two) times daily. Patient not taking: Reported on 09/22/2022 04/21/19   Wallis Bamberg, PA-C  diclofenac (VOLTAREN) 75 MG EC tablet Take 1 tablet (75 mg total) by mouth daily. 05/11/21   Kommor, Madison, MD  escitalopram (LEXAPRO) 10 MG tablet Take 1 tablet (10 mg total) by mouth daily. 09/22/22   Mayers, Cari S, PA-C  ezetimibe (ZETIA) 10 MG tablet Take 1 tablet (10 mg total) by mouth daily. 09/23/22   Mayers, Cari S, PA-C  hydrOXYzine (ATARAX) 25 MG tablet Take 1 tablet (25 mg total) by mouth every 8 (eight) hours as needed for anxiety. 05/11/21   Kommor, Wyn Forster, MD  levothyroxine (SYNTHROID) 50 MCG tablet Take 1 tablet (50 mcg total) by mouth daily before breakfast. 09/22/22   Mayers, Cari S, PA-C  losartan-hydrochlorothiazide (HYZAAR) 100-25 MG tablet Take 1 tablet by mouth daily. 09/22/22   Mayers, Cari S, PA-C  Magnesium 500 MG TABS Take 500 mg by mouth daily.     [provider]  methocarbamol (ROBAXIN) 500 MG tablet Take 1 tablet (500 mg total) by mouth 2 (two) times daily. 11/01/22   Achille Rich, PA-C  omeprazole (PRILOSEC) 20 MG capsule Take 20 mg by mouth daily before lunch.     [provider]  ondansetron (ZOFRAN) 4 MG tablet Take 1 tablet (4 mg total) by mouth every 6 (six) hours. 10/31/22   Achille Rich, PA-C  ondansetron (ZOFRAN-ODT) 8 MG disintegrating tablet Take 1 tablet (8 mg total) by mouth every 8 (eight) hours as needed for nausea or vomiting. Patient not taking: Reported on 09/22/2022 03/03/21   Theadora Rama Scales, PA-C  potassium chloride SA (KLOR-CON M) 20 MEQ tablet Take 2 tablets (40 mEq total) by  mouth once for 1 dose. 10/31/22 10/31/22  Achille Rich, PA-C    Physical Exam: Vitals:   11/03/22 1439 11/03/22 1442 11/03/22 1752 11/03/22 1800  BP:  (!) 166/104 (!) 138/103   Pulse:  84 69   Resp:   10   Temp:  98.1 F (36.7 C)  98 F (36.7 C)  TempSrc:  Oral    SpO2:  100% 99%   Weight: 78.5 kg     Height: 5\' 4"  (1.626 m)      Constitutional: NAD, calm, comfortable, well appearing elderly female sitting upright in bed Eyes: lids and conjunctivae normal ENMT: Mucous membranes are moist. Neck: normal,  supple Respiratory: clear to auscultation bilaterally, no wheezing, no crackles. Normal respiratory effort. No accessory muscle use.  Cardiovascular: Regular rate and rhythm, no murmurs / rubs / gallops. No extremity edema.   Abdomen: soft, moderate left lower quadrant tenderness radiating to left posterior superior iliac bone, no rebound tenderness, guarding or rigidity.  Musculoskeletal: no clubbing / cyanosis. No joint deformity upper and lower extremities.  Normal muscle tone.  Skin: no rashes, lesions, ulcers. No induration Neurologic: CN 2-12 grossly intact.  Strength 5/5 in all 4.  Psychiatric: Normal judgment and insight. Alert and oriented x 3. Normal mood.   Data Reviewed:  See HPI   Assessment and Plan: * Hyponatremia Secondary to decrease oral intake exacerbated being on hydrochlorothiazide and SSRI -continuous IV fluid overnight and follow  Left lower quadrant abdominal pain -Recent CT A/P revealed uterine fibroid and diverticulosis. Pain could be due to fibroids but seems more intense than expected. Concerning for adnexal pathology with torsion, cyst rupture or mass.  -Pelvic ultrasound is pending -if ultrasound unrevealing, will obtain CTA of the abd/pelvis   Hypokalemia -Replete with oral and IV potassium  GAD (generalized anxiety disorder) -continue home SSRI  Essential hypertension -elevated. Reports compliance with medication -continue  losartan-hydrochlorothiazide -PRN labetalol with perimeters.   Hypothyroidism -continue levothyroxine      Advance Care Planning:Full  Consults: none  Family Communication: None at bedside  Severity of Illness: The appropriate patient status for this patient is OBSERVATION. Observation status is judged to be reasonable and necessary in order to provide the required intensity of service to ensure the patient's safety. The patient's presenting symptoms, physical exam findings, and initial radiographic and laboratory data in the context of their medical condition is felt to place them at decreased risk for further clinical deterioration. Furthermore, it is anticipated that the patient will be medically stable for discharge from the hospital within 2 midnights of admission.   Author: Anselm Jungling, DO 11/03/2022 8:14 PM  For on call review [email or web address].

## 2022-11-03 NOTE — ED Provider Notes (Signed)
[place] EMERGENCY DEPARTMENT AT [facility] Provider Note   CSN: [id number] Arrival date & time: 11/03/22  1433     History  Chief Complaint  Patient presents with   Abdominal Pain   Back Pain   Flank Pain    Sheri Villarreal is a 62 y.o. female.  Is a 62 year old female present emergency department for left lower quadrant abdominal pain with radiation around to her back.  She has had decreased p.o. intake secondary to no appetite and pain.  She states that symptoms started Thursday gradual onset and has been nearly constant since that time, although varies in intensity.  Describes it as a crampy sensation as if she is having menstrual cramps, but has not had a period in many years.  She was seen 2 days ago in the emergency department with negative workup and sent home.  Her symptoms have persisted so she has returned today.  Reports since arrival to the emergency department however, her left lower quadrant abdominal pain is much improved.  She has had some nausea and dry heaves, but no actual vomiting.  No diarrhea.   Abdominal Pain Back Pain Associated symptoms: abdominal pain   Flank Pain Associated symptoms include abdominal pain.       Home Medications Prior to Admission medications   Medication Sig Start Date End Date Taking? Authorizing Provider  acetaminophen (TYLENOL) 500 MG tablet Take 1,000 mg by mouth as needed for mild pain.     [provider]  amLODipine (NORVASC) 10 MG tablet Take 10 mg by mouth daily. 02/25/15   [provider]  aspirin EC 81 MG tablet Take 81 mg by mouth daily. 02/25/15   [provider]  azithromycin (ZITHROMAX) 250 MG tablet Take 1 tablet (250 mg total) by mouth daily. Take first 2 tablets together, then 1 every day until finished. Patient not taking: Reported on 09/22/2022 03/03/21   Jannifer Franklin, MD  cefdinir (OMNICEF) 300 MG capsule Take 1 capsule (300 mg total) by mouth 2 (two) times daily. Patient  not taking: Reported on 09/22/2022 04/21/19   Wallis Bamberg, PA-C  diclofenac (VOLTAREN) 75 MG EC tablet Take 1 tablet (75 mg total) by mouth daily. 05/11/21   Kommor, Madison, MD  escitalopram (LEXAPRO) 10 MG tablet Take 1 tablet (10 mg total) by mouth daily. 09/22/22   Mayers, Cari S, PA-C  ezetimibe (ZETIA) 10 MG tablet Take 1 tablet (10 mg total) by mouth daily. 09/23/22   Mayers, Cari S, PA-C  hydrOXYzine (ATARAX) 25 MG tablet Take 1 tablet (25 mg total) by mouth every 8 (eight) hours as needed for anxiety. 05/11/21   Kommor, Wyn Forster, MD  levothyroxine (SYNTHROID) 50 MCG tablet Take 1 tablet (50 mcg total) by mouth daily before breakfast. 09/22/22   Mayers, Cari S, PA-C  losartan-hydrochlorothiazide (HYZAAR) 100-25 MG tablet Take 1 tablet by mouth daily. 09/22/22   Mayers, Cari S, PA-C  Magnesium 500 MG TABS Take 500 mg by mouth daily.     [provider]  methocarbamol (ROBAXIN) 500 MG tablet Take 1 tablet (500 mg total) by mouth 2 (two) times daily. 11/01/22   Achille Rich, PA-C  omeprazole (PRILOSEC) 20 MG capsule Take 20 mg by mouth daily before lunch.     [provider]  ondansetron (ZOFRAN) 4 MG tablet Take 1 tablet (4 mg total) by mouth every 6 (six) hours. 10/31/22   Achille Rich, PA-C  ondansetron (ZOFRAN-ODT) 8 MG disintegrating tablet Take 1 tablet (8 mg  total) by mouth every 8 (eight) hours as needed for nausea or vomiting. Patient not taking: Reported on 09/22/2022 03/03/21   Theadora Rama Scales, PA-C  potassium chloride SA (KLOR-CON M) 20 MEQ tablet Take 2 tablets (40 mEq total) by mouth once for 1 dose. 10/31/22 10/31/22  Achille Rich, PA-C      Allergies    Hydrocodone bit-homatrop mbr and Lipitor [atorvastatin]    Review of Systems   Review of Systems  Gastrointestinal:  Positive for abdominal pain.  Genitourinary:  Positive for flank pain.  Musculoskeletal:  Positive for back pain.    Physical Exam Updated Vital Signs BP (!) 138/103 (BP Location: Right Arm)    Pulse 69   Temp 98 F (36.7 C)   Resp 10   Ht 5\' 4"  (1.626 m)   Wt 78.5 kg   SpO2 99%   BMI 29.70 kg/m  Physical Exam Vitals and nursing note reviewed.  Constitutional:      General: She is not in acute distress.    Appearance: She is not toxic-appearing.  HENT:     Head: Normocephalic.  Cardiovascular:     Rate and Rhythm: Normal rate and regular rhythm.  Pulmonary:     Effort: Pulmonary effort is normal.     Breath sounds: Normal breath sounds.  Abdominal:     General: Abdomen is flat.     Palpations: Abdomen is soft.     Tenderness: There is abdominal tenderness (mild) in the left lower quadrant. There is no guarding or rebound.  Musculoskeletal:     Comments: No midline tenderness.  5 out of 5 plantarflexion, 5 dorsiflexion.  Normal sensation lower extremities.  Skin:    Capillary Refill: Capillary refill takes less than 2 seconds.  Neurological:     Mental Status: She is alert.  Psychiatric:        Mood and Affect: Mood normal.        Behavior: Behavior normal.     ED Results / Procedures / Treatments   Labs (all labs ordered are listed, but only abnormal results are displayed) Labs Reviewed  COMPREHENSIVE METABOLIC PANEL - Abnormal; Notable for the following components:      Result Value   Sodium 124 (*)    Potassium 2.9 (*)    Chloride 87 (*)    Glucose, Bld 101 (*)    All other components within normal limits  CBC - Abnormal; Notable for the following components:   MCH 34.1 (*)    Platelets 409 (*)    All other components within normal limits  LIPASE, BLOOD  URINALYSIS, ROUTINE W REFLEX MICROSCOPIC  MAGNESIUM    EKG None  Radiology No results found.  Procedures Procedures    Medications Ordered in ED Medications  potassium chloride 10 mEq in 100 mL IVPB (10 mEq Intravenous New Bag/Given 11/03/22 1752)  sodium chloride 0.9 % bolus 1,000 mL (1,000 mLs Intravenous New Bag/Given 11/03/22 1751)    ED Course/ Medical Decision Making/ A&P                                  Medical Decision Making 62 year old female present emergency department for lower abdominal pain.  She is afebrile nontachycardic hemodynamically stable.  Physical exam with benign nonsurgical abdomen.  Per chart review had negative CT scan 2 days ago and reports symptoms are unchanged.  Per review of records it appears that she had some calcified  uterine fibroids.  She did describe her pain is somewhat of a menstrual type cramps.  Will get ultrasound to further investigate nature of her underlying pain.  Her lab workup showed a progressive worsening of her sodium level down to 124 and a potassium of 2.9.  No significant kidney injury.  No transaminitis to suggest hepatobiliary disease.  Pancreatitis unlikely as her lipase is normal.  She has no fever tachycardia no leukocytosis on her CBC.  No anemia.  Patient given small fluid bolus.  Will admit for patient's hyponatremia and hypokalemia.  Amount and/or Complexity of Data Reviewed Labs: ordered. Radiology: ordered. ECG/medicine tests: ordered.  Risk Prescription drug management. Decision regarding hospitalization.          Final Clinical Impression(s) / ED Diagnoses Final diagnoses:  Hyponatremia    Rx / DC Orders ED Discharge Orders     None         Coral Spikes, DO 11/03/22 1901

## 2022-11-03 NOTE — Assessment & Plan Note (Addendum)
Secondary to decrease oral intake exacerbated being on hydrochlorothiazide and SSRI -continuous IV fluid overnight and follow

## 2022-11-03 NOTE — Assessment & Plan Note (Addendum)
-  Recent CT A/P revealed uterine fibroid and diverticulosis. Pain could be due to fibroids but seems more intense than expected. Concerning for adnexal pathology with torsion, cyst rupture or mass.  -Pelvic ultrasound is pending -if ultrasound unrevealing, will obtain CTA of the abd/pelvis

## 2022-11-04 ENCOUNTER — Observation Stay (HOSPITAL_COMMUNITY): Payer: Self-pay

## 2022-11-04 DIAGNOSIS — E038 Other specified hypothyroidism: Secondary | ICD-10-CM

## 2022-11-04 LAB — BASIC METABOLIC PANEL
Anion gap: 11 (ref 5–15)
BUN: 11 mg/dL (ref 8–23)
CO2: 21 mmol/L — ABNORMAL LOW (ref 22–32)
Calcium: 8.5 mg/dL — ABNORMAL LOW (ref 8.9–10.3)
Chloride: 92 mmol/L — ABNORMAL LOW (ref 98–111)
Creatinine, Ser: 0.62 mg/dL (ref 0.44–1.00)
GFR, Estimated: >60 mL/min (ref >60)
Glucose, Bld: 87 mg/dL (ref 70–99)
Potassium: 3 mmol/L — ABNORMAL LOW (ref 3.5–5.1)
Sodium: 124 mmol/L — ABNORMAL LOW (ref 135–145)

## 2022-11-04 LAB — TSH: TSH: 43.104 u[IU]/mL — ABNORMAL HIGH (ref 0.350–4.500)

## 2022-11-04 LAB — SODIUM, URINE, RANDOM: Sodium, Ur: 73 mmol/L

## 2022-11-04 LAB — CBC
HCT: 37.3 % (ref 36.0–46.0)
Hemoglobin: 13.4 g/dL (ref 12.0–15.0)
MCH: 33.8 pg (ref 26.0–34.0)
MCHC: 35.9 g/dL (ref 30.0–36.0)
MCV: 94.2 fL (ref 80.0–100.0)
Platelets: 317 10*3/uL (ref 150–400)
RBC: 3.96 MIL/uL (ref 3.87–5.11)
RDW: 11.9 % (ref 11.5–15.5)
WBC: 8.1 10*3/uL (ref 4.0–10.5)
nRBC: 0 % (ref 0.0–0.2)

## 2022-11-04 LAB — OSMOLALITY: Osmolality: 276 mosm/kg (ref 275–295)

## 2022-11-04 LAB — T4, FREE: Free T4: 0.61 ng/dL (ref 0.61–1.12)

## 2022-11-04 LAB — HIV ANTIBODY (ROUTINE TESTING W REFLEX): HIV Screen 4th Generation wRfx: NONREACTIVE

## 2022-11-04 LAB — OSMOLALITY, URINE: Osmolality, Ur: 380 mosm/kg (ref 300–900)

## 2022-11-04 MED ORDER — AMLODIPINE BESYLATE 5 MG PO TABS
5.0000 mg | ORAL_TABLET | Freq: Every day | ORAL | Status: DC
  Administered 2022-11-04 – 2022-11-06 (×3): 5 mg via ORAL
  Filled 2022-11-04 (×3): qty 1

## 2022-11-04 MED ORDER — ONDANSETRON HCL 4 MG/2ML IJ SOLN
4.0000 mg | Freq: Four times a day (QID) | INTRAMUSCULAR | Status: DC | PRN
  Administered 2022-11-04: 4 mg via INTRAVENOUS
  Filled 2022-11-04: qty 2

## 2022-11-04 MED ORDER — SODIUM CHLORIDE (PF) 0.9 % IJ SOLN
INTRAMUSCULAR | Status: AC
  Filled 2022-11-04: qty 50

## 2022-11-04 MED ORDER — HYDRALAZINE HCL 20 MG/ML IJ SOLN: 10.0000 mg | Freq: Four times a day (QID) | INTRAMUSCULAR | Status: DC | PRN

## 2022-11-04 MED ORDER — HYDRALAZINE HCL 25 MG PO TABS
25.0000 mg | ORAL_TABLET | Freq: Three times a day (TID) | ORAL | Status: DC
  Administered 2022-11-04 – 2022-11-06 (×6): 25 mg via ORAL
  Filled 2022-11-04 (×6): qty 1

## 2022-11-04 MED ORDER — IOHEXOL 350 MG/ML SOLN
100.0000 mL | Freq: Once | INTRAVENOUS | Status: AC | PRN
  Administered 2022-11-04: 100 mL via INTRAVENOUS

## 2022-11-04 MED ORDER — LEVOTHYROXINE SODIUM 75 MCG PO TABS
75.0000 ug | ORAL_TABLET | Freq: Every day | ORAL | Status: DC
  Administered 2022-11-05 – 2022-11-06 (×2): 75 ug via ORAL
  Filled 2022-11-04 (×2): qty 1

## 2022-11-04 MED ORDER — POTASSIUM CHLORIDE CRYS ER 20 MEQ PO TBCR
40.0000 meq | EXTENDED_RELEASE_TABLET | Freq: Once | ORAL | Status: AC
  Administered 2022-11-04: 40 meq via ORAL
  Filled 2022-11-04: qty 2

## 2022-11-04 MED ORDER — SODIUM CHLORIDE 0.9 % IV SOLN: INTRAVENOUS | Status: DC

## 2022-11-04 MED ORDER — ENSURE ENLIVE PO LIQD
237.0000 mL | Freq: Two times a day (BID) | ORAL | Status: DC
  Administered 2022-11-04 – 2022-11-06 (×3): 237 mL via ORAL

## 2022-11-04 NOTE — TOC CM/SW Note (Signed)
Transition of Care [facility]) - Inpatient Brief Assessment   Patient Details  Name: Chantice Colden MRN: [id number] Date of Birth: 1960/08/17  Transition of Care [facility]) CM/SW Contact:    Howell Rucks, RN Phone Number: 11/04/2022, 2:45 PM   Clinical Narrative: Met with pt at bedside to introduce role of TOC/NCM and review for dc planning. Pt reports she currently has no health insurance, reports she is seeking insurance through the [employer]. Pt reports she currently has no PCP,  reports she was recently seen at [facility] for exam and refill of routine medications. Pt agreeable to NCM scheduling hospital follow up appt at one of [facility]. Appt scheduled for : [facility], November 18, 2022 at 11:10am. Pt reports no current home care services or home DME, confirms transportation available at discharge. TOC Brief Assessment completed. No further TOC needs identified at this time.     Transition of Care Asessment: Insurance and Status: Selfpay Patient has primary care physician: No (Appointment made at : [facility], November 18, 2022 at 11:10am) Home environment has been reviewed: resides alone with support from friends/family/neighbros Prior level of function:: Independent Prior/Current Home Services: No current home services Social Determinants of Health Reivew: SDOH reviewed no interventions necessary Readmission risk has been reviewed: Yes Transition of care needs: no transition of care needs at this time

## 2022-11-04 NOTE — Progress Notes (Signed)
[facility]                                                                              Sheri Villarreal, is a 62 y.o. female, DOB - 1960-06-15, BJY:[id number] Admit date - 11/03/2022    Outpatient Primary MD for the patient is Pcp, No  LOS - 0  days  Chief Complaint  Patient presents with   Abdominal Pain   Back Pain   Flank Pain       Brief summary   Patient is a 62 year old female with hypertension, hypothyroidism, anxiety, depression presented with left lower quadrant abdominal pain.  Per patient, abdominal pain ongoing for last 5 days, cramping, radiating to the back, associated with decreased oral intake, low appetite. She presented to ED on 9/7, CT abdomen pelvis showed ill-defined and partially calcified uterine fibroids, no adnexal mass, no other intra-abdominal pathology. Per patient pain intensified the night before the admission and hence prompted to return back to ED.   Assessment & Plan    Principal Problem: Acute left lower quadrant abdominal pain -Recent CT abdomen pelvis showed uterine fibroids and diverticulosis but no other acute abdominal pathology -Pelvic ultrasound showed heterogenous uterine fibroid, no ovarian cyst or torsion. -Patient continues to complain of LLQ abdominal pain, obtain CTA abdomen pelvis.  No peritoneal signs on exam.   Active Problems: Acute hyponatremia -Baseline sodium 139 on 09/22/2022 and 130 on 10/31/2022.  Presented with sodium of 124 -Discontinue hydrochlorothiazide and losartan, also has significant hypothyroidism -Placed on normal saline IV fluids -Serum osmolarity, urine osmolality, pending UNA 73  Hypokalemia Replaced p.o.    Hypothyroidism -TSH 43.1, free T4, T3 pending (TSH was 66 on 09/22/2022, T42.2) -Currently on Synthroid 50 mcg daily, increase to 75 mcg p.o. daily, repeat thyroid panel in 4 to 6 weeks   Hypertensive urgency -BP 204/113 this morning -Discontinued losartan, hydrochlorothiazide  (did receive the doses this morning) -Placed on Norvasc 5 mg daily, hydralazine 25 mg 3 times daily     GAD (generalized anxiety disorder) -Hold Lexapro due to hyponatremia   Estimated body mass index is 29.7 kg/m as calculated from the following:   Height as of this encounter: 5\' 4"  (1.626 m).   Weight as of this encounter: 78.5 kg.  Code Status: Full CODE STATUS DVT Prophylaxis:  enoxaparin (LOVENOX) injection 40 mg Start: 11/03/22 2200   Level of Care: Level of care: Telemetry Family Communication: Updated patient Disposition Plan:      Remains inpatient appropriate: Workup in progress   Procedures:    Consultants:     Antimicrobials:   Anti-infectives (From admission, onward)    None          Medications  amLODipine  5 mg Oral Daily   aspirin EC  81 mg Oral Daily   enoxaparin (LOVENOX) injection  40 mg Subcutaneous Q24H   escitalopram  10 mg Oral Daily   ezetimibe  10 mg Oral Daily   feeding supplement  237 mL Oral BID BM   hydrALAZINE  25 mg Oral Q8H   [START ON 11/05/2022] levothyroxine  75 mcg Oral Q0600   magnesium gluconate  500  mg Oral QHS   pantoprazole  40 mg Oral Daily      Subjective:   Emaley Bille was seen and examined today.  Feeling uncomfortable with lower left abdominal pain.  Feeling nauseous but no active vomiting, denies any chest pain, dizziness, any diarrhea.  No fevers.  Objective:   Vitals:   11/04/22 0502 11/04/22 0841 11/04/22 1239 11/04/22 1241  BP: (!) 192/111 (!) 204/113 112/80 114/79  Pulse: 70 71 77 79  Resp: 16 16 19    Temp: 98.1 F (36.7 C) 98.2 F (36.8 C) 98.5 F (36.9 C)   TempSrc:  Oral    SpO2: 95% 98% 99% 98%  Weight:      Height:        Intake/Output Summary (Last 24 hours) at 11/04/2022 1439 Last data filed at 11/04/2022 0410 Gross per 24 hour  Intake 1597.75 ml  Output --  Net 1597.75 ml     Wt Readings from Last 3 Encounters:  11/03/22 78.5 kg  09/22/22 78.9 kg  03/03/19 109.5 kg      Exam General: Alert and oriented x 3, NAD Cardiovascular: S1 S2 auscultated,  RRR Respiratory: Clear to auscultation bilaterally, no wheezing Gastrointestinal: Soft, mild TTP in LLQ, no peritoneal signs, no rebound or guarding.   Ext: no pedal edema bilaterally Neuro: no new deficits Skin: No rashes Psych: Normal affect     Data Reviewed:  I have personally reviewed following labs    CBC Lab Results  Component Value Date   WBC 8.1 11/04/2022   RBC 3.96 11/04/2022   HGB 13.4 11/04/2022   HCT 37.3 11/04/2022   MCV 94.2 11/04/2022   MCH 33.8 11/04/2022   PLT 317 11/04/2022   MCHC 35.9 11/04/2022   RDW 11.9 11/04/2022   LYMPHSABS 1.7 09/22/2022   MONOABS 0.4 05/11/2021   EOSABS 0.1 09/22/2022   BASOSABS 0.1 09/22/2022     Last metabolic panel Lab Results  Component Value Date   NA 124 (L) 11/04/2022   K 3.0 (L) 11/04/2022   CL 92 (L) 11/04/2022   CO2 21 (L) 11/04/2022   BUN 11 11/04/2022   CREATININE 0.62 11/04/2022   GLUCOSE 87 11/04/2022   GFRNONAA >60 11/04/2022   GFRAA >60 02/21/2019   CALCIUM 8.5 (L) 11/04/2022   PROT 7.9 11/03/2022   ALBUMIN 4.3 11/03/2022   LABGLOB 2.7 09/22/2022   BILITOT 0.9 11/03/2022   ALKPHOS 61 11/03/2022   AST 18 11/03/2022   ALT 15 11/03/2022   ANIONGAP 11 11/04/2022    CBG (last 3)  No results for input(s): "GLUCAP" in the last 72 hours.    Coagulation Profile: Recent Labs  Lab 10/31/22 1703  INR 1.0     Radiology Studies: I have personally reviewed the imaging studies  US Pelvis Complete  Result Date: 11/03/2022 CLINICAL DATA:  Left lower quadrant pain. EXAM: TRANSABDOMINAL AND TRANSVAGINAL ULTRASOUND OF PELVIS DOPPLER ULTRASOUND OF OVARIES TECHNIQUE: Both transabdominal and transvaginal ultrasound examinations of the pelvis were performed. Transabdominal technique was performed for global imaging of the pelvis including uterus, ovaries, adnexal regions, and pelvic cul-de-sac. It was necessary to proceed  with endovaginal exam following the transabdominal exam to visualize the uterus, endometrium, bilateral ovaries and bilateral adnexa. Color and duplex Doppler ultrasound was utilized to evaluate blood flow to the ovaries. COMPARISON:  None Available. FINDINGS: Uterus Measurements: 7.6 cm x 3.2 cm x 6.0 cm = volume: 77.3 mL. A 4.0 cm x 3.3 cm x 3.3 cm heterogeneous uterine fibroid is  seen within the uterine fundus on the right. Endometrium Thickness: 16.6 mm.  No focal abnormality visualized. Right ovary Measurements: 2.0 cm x 1.4 cm x 1.7 cm = volume: 2.5 mL. Normal appearance/no adnexal mass. Left ovary Measurements: 2.0 cm x 1.3 cm x 1.7 cm = volume: 2.3 mL. Normal appearance/no adnexal mass. Pulsed Doppler evaluation of both ovaries demonstrates normal low-resistance arterial and venous waveforms. Other findings No abnormal free fluid. IMPRESSION: Heterogeneous uterine fibroid. Electronically Signed   By: Aram Candela M.D.   On: 11/03/2022 21:12   US PELVIC DOPPLER (TORSION R/O OR MASS ARTERIAL FLOW)  Result Date: 11/03/2022 CLINICAL DATA:  Left lower quadrant pain. EXAM: TRANSABDOMINAL AND TRANSVAGINAL ULTRASOUND OF PELVIS DOPPLER ULTRASOUND OF OVARIES TECHNIQUE: Both transabdominal and transvaginal ultrasound examinations of the pelvis were performed. Transabdominal technique was performed for global imaging of the pelvis including uterus, ovaries, adnexal regions, and pelvic cul-de-sac. It was necessary to proceed with endovaginal exam following the transabdominal exam to visualize the uterus, endometrium, bilateral ovaries and bilateral adnexa. Color and duplex Doppler ultrasound was utilized to evaluate blood flow to the ovaries. COMPARISON:  None Available. FINDINGS: Uterus Measurements: 7.6 cm x 3.2 cm x 6.0 cm = volume: 77.3 mL. A 4.0 cm x 3.3 cm x 3.3 cm heterogeneous uterine fibroid is seen within the uterine fundus on the right. Endometrium Thickness: 16.6 mm.  No focal abnormality visualized.  Right ovary Measurements: 2.0 cm x 1.4 cm x 1.7 cm = volume: 2.5 mL. Normal appearance/no adnexal mass. Left ovary Measurements: 2.0 cm x 1.3 cm x 1.7 cm = volume: 2.3 mL. Normal appearance/no adnexal mass. Pulsed Doppler evaluation of both ovaries demonstrates normal low-resistance arterial and venous waveforms. Other findings No abnormal free fluid. IMPRESSION: Heterogeneous uterine fibroid. Electronically Signed   By: Aram Candela M.D.   On: 11/03/2022 21:12       Aubryn Spinola M.D. [facility] 11/04/2022, 2:39 PM  Available via Epic secure chat 7am-7pm After 7 pm, please refer to night coverage provider listed on amion.

## 2022-11-04 NOTE — Plan of Care (Signed)
  Problem: Education: Goal: Knowledge of General Education information will improve Description: Including pain rating scale, medication(s)/side effects and non-pharmacologic comfort measures Outcome: Progressing   Problem: Health Behavior/Discharge Planning: Goal: Ability to manage health-related needs will improve Outcome: Progressing   Problem: Clinical Measurements: Goal: Ability to maintain clinical measurements within normal limits will improve Outcome: Progressing Goal: Will remain free from infection Outcome: Progressing Goal: Diagnostic test results will improve Outcome: Progressing Goal: Respiratory complications will improve Outcome: Progressing Goal: Cardiovascular complication will be avoided Outcome: Progressing   Problem: Health Behavior/Discharge Planning: Goal: Ability to manage health-related needs will improve Outcome: Progressing

## 2022-11-04 NOTE — Plan of Care (Signed)
  Problem: Education: Goal: Knowledge of General Education information will improve Description Including pain rating scale, medication(s)/side effects and non-pharmacologic comfort measures Outcome: Progressing   Problem: Health Behavior/Discharge Planning: Goal: Ability to manage health-related needs will improve Outcome: Progressing   

## 2022-11-04 NOTE — Progress Notes (Signed)
Mobility Specialist - Progress Note   11/04/22 1257  Mobility  Activity Ambulated with assistance in hallway  Level of Assistance Standby assist, set-up cues, supervision of patient - no hands on  Assistive Device None  Distance Ambulated (ft) 600 ft  Range of Motion/Exercises Active  Activity Response Tolerated well  Mobility Referral Yes  $Mobility charge 1 Mobility  Mobility Specialist Start Time (ACUTE ONLY) 1240  Mobility Specialist Stop Time (ACUTE ONLY) 1253  Mobility Specialist Time Calculation (min) (ACUTE ONLY) 13 min   Pt received in bed and agreed to mobility, had no issues throughout session, returned to bed with all needs met.  Marilynne Halsted Mobility Specialist

## 2022-11-05 LAB — BASIC METABOLIC PANEL
Anion gap: 9 (ref 5–15)
BUN: 12 mg/dL (ref 8–23)
CO2: 23 mmol/L (ref 22–32)
Calcium: 8.5 mg/dL — ABNORMAL LOW (ref 8.9–10.3)
Chloride: 93 mmol/L — ABNORMAL LOW (ref 98–111)
Creatinine, Ser: 0.67 mg/dL (ref 0.44–1.00)
GFR, Estimated: >60 mL/min (ref >60)
Glucose, Bld: 78 mg/dL (ref 70–99)
Potassium: 3.2 mmol/L — ABNORMAL LOW (ref 3.5–5.1)
Sodium: 125 mmol/L — ABNORMAL LOW (ref 135–145)

## 2022-11-05 LAB — CULTURE, BLOOD (ROUTINE X 2)
Culture: NO GROWTH
Culture: NO GROWTH
Special Requests: ADEQUATE

## 2022-11-05 LAB — T3: T3, Total: 25 ng/dL — ABNORMAL LOW (ref 71–180)

## 2022-11-05 LAB — MAGNESIUM: Magnesium: 2 mg/dL (ref 1.7–2.4)

## 2022-11-05 MED ORDER — SODIUM CHLORIDE 1 G PO TABS
1.0000 g | ORAL_TABLET | Freq: Three times a day (TID) | ORAL | Status: DC
  Administered 2022-11-05 – 2022-11-06 (×4): 1 g via ORAL
  Filled 2022-11-05 (×4): qty 1

## 2022-11-05 MED ORDER — POTASSIUM CHLORIDE CRYS ER 20 MEQ PO TBCR
40.0000 meq | EXTENDED_RELEASE_TABLET | Freq: Once | ORAL | Status: AC
  Administered 2022-11-05: 40 meq via ORAL
  Filled 2022-11-05: qty 2

## 2022-11-05 NOTE — Progress Notes (Signed)
PT Cancellation Note  Patient Details Name: Sheri Villarreal MRN: [id number] DOB: 25-Feb-1960   Cancelled Treatment:    Reason Eval/Treat Not Completed: PT screened, no needs identified, will sign off Pt mobilizing well with mobility specialists.  Checked in with mobility specialist pt had today and no PT needs identified.  PT to sign off.   Janan Halter Payson 11/05/2022, 2:44 PM Paulino Door, DPT Physical Therapist Acute Rehabilitation Services Office: [phone]

## 2022-11-05 NOTE — Progress Notes (Signed)
[facility]                                                                              Sheri Villarreal, is a 62 y.o. female, DOB - Jul 07, 1960, VWU:[id number] Admit date - 11/03/2022    Outpatient Primary MD for the patient is Pcp, No  LOS - 1  days  Chief Complaint  Patient presents with   Abdominal Pain   Back Pain   Flank Pain       Brief summary   Patient is a 62 year old female with hypertension, hypothyroidism, anxiety, depression presented with left lower quadrant abdominal pain.  Per patient, abdominal pain ongoing for last 5 days, cramping, radiating to the back, associated with decreased oral intake, low appetite. She presented to ED on 9/7, CT abdomen pelvis showed ill-defined and partially calcified uterine fibroids, no adnexal mass, no other intra-abdominal pathology. Per patient pain intensified the night before the admission and hence prompted to return back to ED.   Assessment & Plan    Principal Problem: Acute left lower quadrant abdominal pain -Recent CT abdomen pelvis showed uterine fibroids and diverticulosis but no other acute abdominal pathology -Pelvic ultrasound showed heterogenous uterine fibroid, no ovarian cyst or torsion. -CT abdomen and pelvis showed no evidence of significant stenosis or occlusion of visceral arteries to suggest chronic mesenteric ischemia.  Small left fat-containing inguinal hernia, no obstruction or incarceration noted on the CTA.  Dextroconvex scoliosis of the L-spine at L3, multilevel degenerative disc disease including grade 1 anterolisthesis of L4 on 5. -Overnight had pain in the left groin region radiating to the back, currently no pain. -Advance to regular diet, DC IV fluids, PT OT and improve mobility  Active Problems: Acute hyponatremia -Baseline sodium 139 on 09/22/2022 and 130 on 10/31/2022.  Presented with sodium of 124 -Discontinue hydrochlorothiazide and losartan, -Patient was placed on IV fluid  hydration, urine osmolality 380, urine sodium 73, serum osmolarity 276, SIADH component with HCTZ and profound hypothyroidism contributing -DC IV fluids, placed on salt tabs 1 g 3 times daily  Hypokalemia Replaced p.o.    Hypothyroidism -TSH 43.1, free T4, T3 pending (TSH was 66 on 09/22/2022, T42.2) -Outpatient on Synthroid 50 mcg daily, increase to 75 mcg p.o. daily, repeat thyroid panel in 4 to 6 weeks   Hypertensive urgency -BP 204/113 on admission -Discontinued losartan, hydrochlorothiazide  -Placed on Norvasc 5 mg daily, hydralazine 25 mg 3 times daily, BP stable   Small infrarenal AAA incidentally found on CTA   Small infrarenal AAA with maximal diameter of 2.6 cm, recommended follow-up ultrasound every 5 years    GAD (generalized anxiety disorder) -Hold Lexapro due to hyponatremia  L3-L4-L5 DJD -Continue pain control, PT evaluation -Outpatient follow-up with orthopedics   Estimated body mass index is 29.7 kg/m as calculated from the following:   Height as of this encounter: 5\' 4"  (1.626 m).   Weight as of this encounter: 78.5 kg.  Code Status: Full CODE STATUS DVT Prophylaxis:  enoxaparin (LOVENOX) injection 40 mg Start: 11/03/22 2200   Level of Care: Level of care: Telemetry Family Communication: Updated patient Disposition Plan:  Remains inpatient appropriate: Workup in progress, hopefully DC home in a.m.   Procedures:    Consultants:     Antimicrobials:   Anti-infectives (From admission, onward)    None          Medications  amLODipine  5 mg Oral Daily   aspirin EC  81 mg Oral Daily   enoxaparin (LOVENOX) injection  40 mg Subcutaneous Q24H   ezetimibe  10 mg Oral Daily   feeding supplement  237 mL Oral BID BM   hydrALAZINE  25 mg Oral Q8H   levothyroxine  75 mcg Oral Q0600   magnesium gluconate  500 mg Oral QHS   pantoprazole  40 mg Oral Daily   sodium chloride  1 g Oral TID WC      Subjective:   Sheri Villarreal was seen and  examined today.  Overnight had low back pain and left groin pain.  Currently no pain, nausea vomiting, chest pain or shortness of breath.  No fevers.  Objective:   Vitals:   11/04/22 2058 11/05/22 0050 11/05/22 0100 11/05/22 0500  BP: (!) 149/88 (!) 162/85 (!) 143/83 (!) 140/86  Pulse: (!) 57 67 61 68  Resp:    17  Temp: 98.2 F (36.8 C) 98.1 F (36.7 C)  97.9 F (36.6 C)  TempSrc: Oral Oral  Oral  SpO2: 100% 98%  98%  Weight:      Height:        Intake/Output Summary (Last 24 hours) at 11/05/2022 1411 Last data filed at 11/05/2022 0600 Gross per 24 hour  Intake 1404 ml  Output --  Net 1404 ml     Wt Readings from Last 3 Encounters:  11/03/22 78.5 kg  09/22/22 78.9 kg  03/03/19 109.5 kg   Physical Exam General: Alert and oriented x 3, NAD Cardiovascular: S1 S2 clear, RRR.  Respiratory: CTAB Gastrointestinal: Soft, nontender, nondistended, NBS Ext: no pedal edema bilaterally Neuro: no new deficits Psych: Normal affect     Data Reviewed:  I have personally reviewed following labs    CBC Lab Results  Component Value Date   WBC 8.1 11/04/2022   RBC 3.96 11/04/2022   HGB 13.4 11/04/2022   HCT 37.3 11/04/2022   MCV 94.2 11/04/2022   MCH 33.8 11/04/2022   PLT 317 11/04/2022   MCHC 35.9 11/04/2022   RDW 11.9 11/04/2022   LYMPHSABS 1.7 09/22/2022   MONOABS 0.4 05/11/2021   EOSABS 0.1 09/22/2022   BASOSABS 0.1 09/22/2022     Last metabolic panel Lab Results  Component Value Date   NA 125 (L) 11/05/2022   K 3.2 (L) 11/05/2022   CL 93 (L) 11/05/2022   CO2 23 11/05/2022   BUN 12 11/05/2022   CREATININE 0.67 11/05/2022   GLUCOSE 78 11/05/2022   GFRNONAA >60 11/05/2022   GFRAA >60 02/21/2019   CALCIUM 8.5 (L) 11/05/2022   PROT 7.9 11/03/2022   ALBUMIN 4.3 11/03/2022   LABGLOB 2.7 09/22/2022   BILITOT 0.9 11/03/2022   ALKPHOS 61 11/03/2022   AST 18 11/03/2022   ALT 15 11/03/2022   ANIONGAP 9 11/05/2022    CBG (last 3)  No results for input(s):  "GLUCAP" in the last 72 hours.    Coagulation Profile: Recent Labs  Lab 10/31/22 1703  INR 1.0     Radiology Studies: I have personally reviewed the imaging studies  CT Angio Abd/Pel w/ and/or w/o  Result Date: 11/05/2022 CLINICAL DATA:  Lower abdominal pain. Assess for mesenteric ischemia. EXAM:  CTA ABDOMEN AND PELVIS WITHOUT AND WITH CONTRAST TECHNIQUE: Multidetector CT imaging of the abdomen and pelvis was performed using the standard protocol during bolus administration of intravenous contrast. Multiplanar reconstructed images and MIPs were obtained and reviewed to evaluate the vascular anatomy. RADIATION DOSE REDUCTION: This exam was performed according to the departmental dose-optimization program which includes automated exposure control, adjustment of the mA and/or kV according to patient size and/or use of iterative reconstruction technique. CONTRAST:  OMNIPAQUE IOHEXOL 350 MG/ML SOLN COMPARISON:  Recent prior CT scan of the abdomen and pelvis 10/31/2022 FINDINGS: VASCULAR Aorta: Mild aneurysmal dilation of the infrarenal abdominal aorta with a maximal diameter of 2.6 cm (1.5 X the measurement of the native aorta measured at 1.7 cm). Scattered atherosclerotic plaque. No evidence of dissection. Celiac: Patent without evidence of aneurysm, dissection, vasculitis or significant stenosis. Mildly variant anatomy. The right hepatic artery arises directly from the aorta. The common hepatic artery provides the gastroduodenal and left hepatic arteries. SMA: Patent without evidence of aneurysm, dissection, vasculitis or significant stenosis. Renals: Both renal arteries are patent without evidence of aneurysm, dissection, vasculitis, fibromuscular dysplasia or significant stenosis. IMA: Patent without evidence of aneurysm, dissection, vasculitis or significant stenosis. Inflow: Patent without evidence of aneurysm, dissection, vasculitis or significant stenosis. Proximal Outflow: Bilateral common  femoral and visualized portions of the superficial and profunda femoral arteries are patent without evidence of aneurysm, dissection, vasculitis or significant stenosis. Veins: No focal venous abnormality. Review of the MIP images confirms the above findings. NON-VASCULAR Lower chest: Chronic elevation of the left hemidiaphragm. Mild left lower lobe atelectasis. Hepatobiliary: No focal liver abnormality is seen. No gallstones, gallbladder wall thickening, or biliary dilatation. Pancreas: Unremarkable. No pancreatic ductal dilatation or surrounding inflammatory changes. Spleen: Normal in size without focal abnormality. Adrenals/Urinary Tract: Adrenal glands are unremarkable. Kidneys are normal, without renal calculi, focal lesion, or hydronephrosis. Bladder is unremarkable. Stomach/Bowel: No focal bowel wall thickening or evidence of obstruction. There are a few scattered colonic diverticula but no evidence of active diverticulitis. Lymphatic: No suspicious lymphadenopathy. Reproductive: Degenerated calcified uterine fibroids. No adnexal masses. Other: Small fat containing left inguinal hernia.  No ascites. Musculoskeletal: Dextroconvex scoliosis of the lumbar spine with the apex at L3. There is associated multilevel degenerative disc disease most significant at L3-L4. Grade 1 anterolisthesis of L4 on L5. IMPRESSION: VASCULAR 1. No evidence of significant stenosis or occlusion of the visceral arteries to suggest a source for chronic mesenteric ischemia. 2. Mild aneurysmal dilation of the infrarenal abdominal aorta with a maximal diameter of 2.6 cm (1.5 X the size of the normal native aorta). Recommend follow-up US every 5 years. This recommendation follows ACR consensus guidelines: White Paper of the ACR Incidental Findings Committee II on Vascular Findings. J Am Coll Radiol 2013; 40:981-191. 3. Aortic Atherosclerosis (ICD10-I70.0); Aortic aneurysm NOS (ICD10-I71.9). 4. Mildly variant celiac artery anatomy as above.  NON-VASCULAR 1. No acute abnormality within the abdomen and pelvis. 2. Small left fat containing inguinal hernia. 3. Dextroconvex scoliosis of the lumbar spine with the apex at L3. Associated multilevel degenerative disc disease including grade 1 anterolisthesis of L4 on L5. 4. Additional ancillary findings as above. Electronically Signed   By: Malachy Moan M.D.   On: 11/05/2022 08:12   US Pelvis Complete  Result Date: 11/03/2022 CLINICAL DATA:  Left lower quadrant pain. EXAM: TRANSABDOMINAL AND TRANSVAGINAL ULTRASOUND OF PELVIS DOPPLER ULTRASOUND OF OVARIES TECHNIQUE: Both transabdominal and transvaginal ultrasound examinations of the pelvis were performed. Transabdominal technique was performed for global  imaging of the pelvis including uterus, ovaries, adnexal regions, and pelvic cul-de-sac. It was necessary to proceed with endovaginal exam following the transabdominal exam to visualize the uterus, endometrium, bilateral ovaries and bilateral adnexa. Color and duplex Doppler ultrasound was utilized to evaluate blood flow to the ovaries. COMPARISON:  None Available. FINDINGS: Uterus Measurements: 7.6 cm x 3.2 cm x 6.0 cm = volume: 77.3 mL. A 4.0 cm x 3.3 cm x 3.3 cm heterogeneous uterine fibroid is seen within the uterine fundus on the right. Endometrium Thickness: 16.6 mm.  No focal abnormality visualized. Right ovary Measurements: 2.0 cm x 1.4 cm x 1.7 cm = volume: 2.5 mL. Normal appearance/no adnexal mass. Left ovary Measurements: 2.0 cm x 1.3 cm x 1.7 cm = volume: 2.3 mL. Normal appearance/no adnexal mass. Pulsed Doppler evaluation of both ovaries demonstrates normal low-resistance arterial and venous waveforms. Other findings No abnormal free fluid. IMPRESSION: Heterogeneous uterine fibroid. Electronically Signed   By: Aram Candela M.D.   On: 11/03/2022 21:12   US PELVIC DOPPLER (TORSION R/O OR MASS ARTERIAL FLOW)  Result Date: 11/03/2022 CLINICAL DATA:  Left lower quadrant pain. EXAM:  TRANSABDOMINAL AND TRANSVAGINAL ULTRASOUND OF PELVIS DOPPLER ULTRASOUND OF OVARIES TECHNIQUE: Both transabdominal and transvaginal ultrasound examinations of the pelvis were performed. Transabdominal technique was performed for global imaging of the pelvis including uterus, ovaries, adnexal regions, and pelvic cul-de-sac. It was necessary to proceed with endovaginal exam following the transabdominal exam to visualize the uterus, endometrium, bilateral ovaries and bilateral adnexa. Color and duplex Doppler ultrasound was utilized to evaluate blood flow to the ovaries. COMPARISON:  None Available. FINDINGS: Uterus Measurements: 7.6 cm x 3.2 cm x 6.0 cm = volume: 77.3 mL. A 4.0 cm x 3.3 cm x 3.3 cm heterogeneous uterine fibroid is seen within the uterine fundus on the right. Endometrium Thickness: 16.6 mm.  No focal abnormality visualized. Right ovary Measurements: 2.0 cm x 1.4 cm x 1.7 cm = volume: 2.5 mL. Normal appearance/no adnexal mass. Left ovary Measurements: 2.0 cm x 1.3 cm x 1.7 cm = volume: 2.3 mL. Normal appearance/no adnexal mass. Pulsed Doppler evaluation of both ovaries demonstrates normal low-resistance arterial and venous waveforms. Other findings No abnormal free fluid. IMPRESSION: Heterogeneous uterine fibroid. Electronically Signed   By: Aram Candela M.D.   On: 11/03/2022 21:12       Jazzy Parmer M.D. [facility] 11/05/2022, 2:11 PM  Available via Epic secure chat 7am-7pm After 7 pm, please refer to night coverage provider listed on amion.

## 2022-11-05 NOTE — Progress Notes (Signed)
Mobility Specialist - Progress Note   11/05/22 1411  Mobility  Activity Ambulated independently in hallway  Level of Assistance Independent  Assistive Device None  Distance Ambulated (ft) 800 ft  Activity Response Tolerated well  Mobility Referral Yes  $Mobility charge 1 Mobility  Mobility Specialist Start Time (ACUTE ONLY) 0158  Mobility Specialist Stop Time (ACUTE ONLY) 0211  Mobility Specialist Time Calculation (min) (ACUTE ONLY) 13 min   Pt received in bed and agreeable to mobility. No complaints during session. Pt to bathroom after session with all needs met.    [facility]

## 2022-11-05 NOTE — Plan of Care (Signed)
Pt is progressing 

## 2022-11-06 DIAGNOSIS — M549 Dorsalgia, unspecified: Secondary | ICD-10-CM | POA: Insufficient documentation

## 2022-11-06 DIAGNOSIS — M5440 Lumbago with sciatica, unspecified side: Secondary | ICD-10-CM

## 2022-11-06 LAB — BASIC METABOLIC PANEL
Anion gap: 8 (ref 5–15)
BUN: 13 mg/dL (ref 8–23)
CO2: 22 mmol/L (ref 22–32)
Calcium: 8.9 mg/dL (ref 8.9–10.3)
Chloride: 96 mmol/L — ABNORMAL LOW (ref 98–111)
Creatinine, Ser: 0.53 mg/dL (ref 0.44–1.00)
GFR, Estimated: >60 mL/min (ref >60)
Glucose, Bld: 83 mg/dL (ref 70–99)
Potassium: 3.7 mmol/L (ref 3.5–5.1)
Sodium: 126 mmol/L — ABNORMAL LOW (ref 135–145)

## 2022-11-06 MED ORDER — CYCLOBENZAPRINE HCL 5 MG PO TABS: 5.0000 mg | ORAL_TABLET | Freq: Three times a day (TID) | ORAL | 0 refills | Status: DC | PRN

## 2022-11-06 MED ORDER — FUROSEMIDE 20 MG PO TABS
20.0000 mg | ORAL_TABLET | Freq: Every day | ORAL | Status: DC
  Administered 2022-11-06: 20 mg via ORAL
  Filled 2022-11-06: qty 1

## 2022-11-06 MED ORDER — SODIUM CHLORIDE 1 G PO TABS: 1.0000 g | ORAL_TABLET | Freq: Three times a day (TID) | ORAL | 0 refills | Status: DC

## 2022-11-06 MED ORDER — CYCLOBENZAPRINE HCL 5 MG PO TABS: 5.0000 mg | ORAL_TABLET | Freq: Three times a day (TID) | ORAL | Status: DC | PRN

## 2022-11-06 MED ORDER — HYDRALAZINE HCL 25 MG PO TABS: 25.0000 mg | ORAL_TABLET | Freq: Three times a day (TID) | ORAL | 3 refills | Status: DC

## 2022-11-06 MED ORDER — GABAPENTIN 100 MG PO CAPS: 200.0000 mg | ORAL_CAPSULE | Freq: Every day | ORAL | Status: DC

## 2022-11-06 MED ORDER — AMLODIPINE BESYLATE 5 MG PO TABS: 5.0000 mg | ORAL_TABLET | Freq: Every day | ORAL | 3 refills | Status: DC

## 2022-11-06 MED ORDER — GABAPENTIN 100 MG PO CAPS: 200.0000 mg | ORAL_CAPSULE | Freq: Every day | ORAL | 2 refills | Status: DC

## 2022-11-06 MED ORDER — TRAMADOL HCL 50 MG PO TABS
50.0000 mg | ORAL_TABLET | Freq: Three times a day (TID) | ORAL | 0 refills | Status: AC | PRN
Start: 2022-11-06 — End: 2023-11-06

## 2022-11-06 MED ORDER — LEVOTHYROXINE SODIUM 75 MCG PO TABS: 75.0000 ug | ORAL_TABLET | Freq: Every day | ORAL | 3 refills | Status: DC

## 2022-11-06 MED ORDER — FUROSEMIDE 20 MG PO TABS: 20.0000 mg | ORAL_TABLET | Freq: Every day | ORAL | 0 refills | Status: DC

## 2022-11-06 NOTE — Discharge Summary (Addendum)
Physician Discharge Summary   Patient: Sheri Villarreal MRN: [id number] DOB: 1960/05/11  Admit date:     11/03/2022  Discharge date: 11/06/22  Discharge Physician: Thad Ranger, MD    PCP: Pcp, No   Recommendations at discharge:   Discontinue losartan/HCTZ. 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  Per patient, abdominal pain ongoing for last 5 days, cramping, radiating to the back, associated with decreased oral intake, low appetite. She presented to ED on 9/7, CT abdomen pelvis showed ill-defined and partially calcified uterine fibroids, no adnexal mass, no other intra-abdominal pathology. Per patient pain intensified the night before the admission and hence prompted to return back to ED.  Assessment and Plan:  Acute left lower quadrant abdominal pain/back pain -Recent CT abdomen pelvis showed uterine fibroids and diverticulosis but no other acute abdominal pathology -Pelvic ultrasound showed heterogenous uterine fibroid, no ovarian cyst or torsion. -CT abdomen and pelvis showed no evidence of significant stenosis or occlusion of visceral arteries to suggest chronic mesenteric ischemia.   Small left fat-containing inguinal hernia, no obstruction or incarceration noted on the CTA.  Dextroconvex scoliosis of the L-spine at L3, multilevel degenerative disc disease including grade 1 anterolisthesis of L4 on 5. -No pain during the day however patient reports pain at night in the lower back, left hip -Placed on gabapentin 200 mg at bedtime, tramadol as needed for pain, Flexeril as needed for pain -Outpatient referral to orthopedics sent    Acute hyponatremia -Baseline sodium 139 on 09/22/2022 and 130 on 10/31/2022.  Presented with sodium of 124 -Discontinue hydrochlorothiazide and losartan, -Patient was placed on IV fluid hydration, urine osmolality 380, urine sodium 73, serum osmolarity 276, SIADH component with HCTZ and profound hypothyroidism contributing -DC IV fluids, placed on salt tabs 1 g 3 times daily -Sodium improving, also added Lasix 20 mg daily for 3 days   Hypokalemia Replaced p.o.   Profound hypothyroidism -TSH 43.1, free T4 0.61, T3 25, (TSH was 66 on 09/22/2022, T4 2.2) -Outpatient on Synthroid 50 mcg daily, increase to 75 mcg p.o. 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   Weight as of this encounter: 78.5 kg.      Pain control - [employer] Controlled Substance Reporting System database was reviewed. and patient was instructed, not to  drive, operate heavy machinery, perform activities at heights, swimming or participation in water activities or provide baby-sitting services while on Pain, Sleep and Anxiety Medications; until their outpatient Physician has advised to do so again. Also recommended to not to take more than prescribed Pain, Sleep and Anxiety Medications.  Consultants: None Procedures performed: None Disposition: Home Diet recommendation:  Discharge Diet Orders (From admission, onward)     Start     Ordered   11/06/22 0000  Diet general        11/06/22 1140            DISCHARGE MEDICATION: Allergies as of 11/06/2022       Reactions   Hydrocodone Bit-homatrop Mbr Itching, Other (See Comments)   Reaction to Hycodan cough syrup (Patient has taken Norco in the past with NO reaction)   Lipitor [atorvastatin] Other (See Comments)   Leg pain   Pork-derived Products    Robaxin [methocarbamol] Other (See Comments)   Developed sweating (forehead) shortly after taking; uncomfortable feeling   Shellfish Allergy         Medication List     STOP taking these medications    escitalopram 10 MG tablet Commonly known as: LEXAPRO   losartan-hydrochlorothiazide 100-25 MG tablet Commonly known as: HYZAAR       TAKE these medications    acetaminophen 500 MG tablet Commonly known as: TYLENOL Take 1,000 mg by mouth every 6 (six) hours as needed for mild pain or headache.   amLODipine 5 MG tablet Commonly known as: NORVASC Take 1 tablet (5 mg total) by mouth daily. Start taking on: November 07, 2022   aspirin EC 81 MG tablet Take 81 mg by mouth daily.   cyclobenzaprine 5 MG tablet Commonly known as: FLEXERIL Take 1 tablet (5 mg total) by mouth 3 (three) times daily as needed for muscle spasms.   ezetimibe 10 MG tablet Commonly known as: Zetia Take 1 tablet (10 mg total) by mouth daily.   furosemide 20 MG tablet Commonly known as: LASIX Take 1 tablet (20 mg total) by mouth daily for 3  days. Start taking on: November 07, 2022   gabapentin 100 MG capsule Commonly known as: NEURONTIN Take 2 capsules (200 mg total) by mouth at bedtime.   hydrALAZINE 25 MG tablet Commonly known as: APRESOLINE Take 1 tablet (25 mg total) by mouth 3 (three) times daily.   levothyroxine 75 MCG tablet Commonly known as: SYNTHROID Take 1 tablet (75 mcg total) by mouth daily at 6 (six) AM. Start taking on: November 07, 2022 What changed:  medication strength how much to take when to take this   Magnesium 500 MG Tabs Take 500 mg by mouth at bedtime.   ondansetron 4 MG tablet Commonly known as: ZOFRAN Take 1 tablet (4 mg total) by mouth every 6 (six) hours. What changed:  when to take this reasons to take this   PriLOSEC OTC 20 MG tablet Generic drug: omeprazole Take 20 mg by mouth daily before breakfast.   sodium chloride 1 g tablet Take 1 tablet (1 g total) by mouth 3 (three) times daily with meals.   traMADol 50 MG tablet Commonly known as: Ultram Take 1 tablet (50 mg total) by mouth every 8 (eight) hours as needed.  Follow-up Information     [facility] Follow up on 11/18/2022.   Why: [address], [place], [place] [address].  At 11:10 AM with Gwinda Passe. Contact information: [address] [place] [address] [phone]        Kathryne Hitch, MD. Schedule an appointment as soon as possible for a visit in 2 week(s).   Specialty: Orthopedic Surgery Why: for hospital follow-up Contact information: [address] [place] [address] [phone]                Discharge Exam: Ceasar Mons Weights   11/03/22 1439  Weight: 78.5 kg   S: No acute complaints this morning.  She had lower back pain and right hip pain last night but improved this morning.  No nausea vomiting, no radiation of the pain, no numbness or tingling or signs of radiculopathy.  Wants to go home.  BP (!)  166/94 (BP Location: Left Arm)   Pulse 66   Temp 98.6 F (37 C) (Oral)   Resp 16   Ht 5\' 4"  (1.626 m)   Wt 78.5 kg   SpO2 98%   BMI 29.70 kg/m   Physical Exam General: Alert and oriented x 3, NAD Cardiovascular: S1 S2 clear, RRR.  Respiratory: CTAB, no wheezing, rales or rhonchi Gastrointestinal: Soft, nontender, nondistended, NBS Ext: no pedal edema bilaterally Neuro: no new deficits Skin: No rashes Psych: Normal affect and demeanor, alert and oriented x3     Condition at discharge: fair  The results of significant diagnostics from this hospitalization (including imaging, microbiology, ancillary and laboratory) are listed below for reference.   Imaging Studies: CT Angio Abd/Pel w/ and/or w/o  Result Date: 11/05/2022 CLINICAL DATA:  Lower abdominal pain. Assess for mesenteric ischemia. EXAM: CTA ABDOMEN AND PELVIS WITHOUT AND WITH CONTRAST TECHNIQUE: Multidetector CT imaging of the abdomen and pelvis was performed using the standard protocol during bolus administration of intravenous contrast. Multiplanar reconstructed images and MIPs were obtained and reviewed to evaluate the vascular anatomy. RADIATION DOSE REDUCTION: This exam was performed according to the departmental dose-optimization program which includes automated exposure control, adjustment of the mA and/or kV according to patient size and/or use of iterative reconstruction technique. CONTRAST:  OMNIPAQUE IOHEXOL 350 MG/ML SOLN COMPARISON:  Recent prior CT scan of the abdomen and pelvis 10/31/2022 FINDINGS: VASCULAR Aorta: Mild aneurysmal dilation of the infrarenal abdominal aorta with a maximal diameter of 2.6 cm (1.5 X the measurement of the native aorta measured at 1.7 cm). Scattered atherosclerotic plaque. No evidence of dissection. Celiac: Patent without evidence of aneurysm, dissection, vasculitis or significant stenosis. Mildly variant anatomy. The right hepatic artery arises directly from the aorta. The common  hepatic artery provides the gastroduodenal and left hepatic arteries. SMA: Patent without evidence of aneurysm, dissection, vasculitis or significant stenosis. Renals: Both renal arteries are patent without evidence of aneurysm, dissection, vasculitis, fibromuscular dysplasia or significant stenosis. IMA: Patent without evidence of aneurysm, dissection, vasculitis or significant stenosis. Inflow: Patent without evidence of aneurysm, dissection, vasculitis or significant stenosis. Proximal Outflow: Bilateral common femoral and visualized portions of the superficial and profunda femoral arteries are patent without evidence of aneurysm, dissection, vasculitis or significant stenosis. Veins: No focal venous abnormality. Review of the MIP images confirms the above findings. NON-VASCULAR Lower chest: Chronic elevation of the left hemidiaphragm. Mild left lower lobe atelectasis. Hepatobiliary: No focal liver abnormality is seen. No gallstones, gallbladder wall thickening, or biliary dilatation. Pancreas: Unremarkable. No pancreatic ductal  dilatation or surrounding inflammatory changes. Spleen: Normal in size without focal abnormality. Adrenals/Urinary Tract: Adrenal glands are unremarkable. Kidneys are normal, without renal calculi, focal lesion, or hydronephrosis. Bladder is unremarkable. Stomach/Bowel: No focal bowel wall thickening or evidence of obstruction. There are a few scattered colonic diverticula but no evidence of active diverticulitis. Lymphatic: No suspicious lymphadenopathy. Reproductive: Degenerated calcified uterine fibroids. No adnexal masses. Other: Small fat containing left inguinal hernia.  No ascites. Musculoskeletal: Dextroconvex scoliosis of the lumbar spine with the apex at L3. There is associated multilevel degenerative disc disease most significant at L3-L4. Grade 1 anterolisthesis of L4 on L5. IMPRESSION: VASCULAR 1. No evidence of significant stenosis or occlusion of the visceral arteries to  suggest a source for chronic mesenteric ischemia. 2. Mild aneurysmal dilation of the infrarenal abdominal aorta with a maximal diameter of 2.6 cm (1.5 X the size of the normal native aorta). Recommend follow-up US every 5 years. This recommendation follows ACR consensus guidelines: White Paper of the ACR Incidental Findings Committee II on Vascular Findings. J Am Coll Radiol 2013; 16:109-604. 3. Aortic Atherosclerosis (ICD10-I70.0); Aortic aneurysm NOS (ICD10-I71.9). 4. Mildly variant celiac artery anatomy as above. NON-VASCULAR 1. No acute abnormality within the abdomen and pelvis. 2. Small left fat containing inguinal hernia. 3. Dextroconvex scoliosis of the lumbar spine with the apex at L3. Associated multilevel degenerative disc disease including grade 1 anterolisthesis of L4 on L5. 4. Additional ancillary findings as above. Electronically Signed   By: Malachy Moan M.D.   On: 11/05/2022 08:12   US Pelvis Complete  Result Date: 11/03/2022 CLINICAL DATA:  Left lower quadrant pain. EXAM: TRANSABDOMINAL AND TRANSVAGINAL ULTRASOUND OF PELVIS DOPPLER ULTRASOUND OF OVARIES TECHNIQUE: Both transabdominal and transvaginal ultrasound examinations of the pelvis were performed. Transabdominal technique was performed for global imaging of the pelvis including uterus, ovaries, adnexal regions, and pelvic cul-de-sac. It was necessary to proceed with endovaginal exam following the transabdominal exam to visualize the uterus, endometrium, bilateral ovaries and bilateral adnexa. Color and duplex Doppler ultrasound was utilized to evaluate blood flow to the ovaries. COMPARISON:  None Available. FINDINGS: Uterus Measurements: 7.6 cm x 3.2 cm x 6.0 cm = volume: 77.3 mL. A 4.0 cm x 3.3 cm x 3.3 cm heterogeneous uterine fibroid is seen within the uterine fundus on the right. Endometrium Thickness: 16.6 mm.  No focal abnormality visualized. Right ovary Measurements: 2.0 cm x 1.4 cm x 1.7 cm = volume: 2.5 mL. Normal  appearance/no adnexal mass. Left ovary Measurements: 2.0 cm x 1.3 cm x 1.7 cm = volume: 2.3 mL. Normal appearance/no adnexal mass. Pulsed Doppler evaluation of both ovaries demonstrates normal low-resistance arterial and venous waveforms. Other findings No abnormal free fluid. IMPRESSION: Heterogeneous uterine fibroid. Electronically Signed   By: Aram Candela M.D.   On: 11/03/2022 21:12   US PELVIC DOPPLER (TORSION R/O OR MASS ARTERIAL FLOW)  Result Date: 11/03/2022 CLINICAL DATA:  Left lower quadrant pain. EXAM: TRANSABDOMINAL AND TRANSVAGINAL ULTRASOUND OF PELVIS DOPPLER ULTRASOUND OF OVARIES TECHNIQUE: Both transabdominal and transvaginal ultrasound examinations of the pelvis were performed. Transabdominal technique was performed for global imaging of the pelvis including uterus, ovaries, adnexal regions, and pelvic cul-de-sac. It was necessary to proceed with endovaginal exam following the transabdominal exam to visualize the uterus, endometrium, bilateral ovaries and bilateral adnexa. Color and duplex Doppler ultrasound was utilized to evaluate blood flow to the ovaries. COMPARISON:  None Available. FINDINGS: Uterus Measurements: 7.6 cm x 3.2 cm x 6.0 cm = volume: 77.3 mL. A 4.0  cm x 3.3 cm x 3.3 cm heterogeneous uterine fibroid is seen within the uterine fundus on the right. Endometrium Thickness: 16.6 mm.  No focal abnormality visualized. Right ovary Measurements: 2.0 cm x 1.4 cm x 1.7 cm = volume: 2.5 mL. Normal appearance/no adnexal mass. Left ovary Measurements: 2.0 cm x 1.3 cm x 1.7 cm = volume: 2.3 mL. Normal appearance/no adnexal mass. Pulsed Doppler evaluation of both ovaries demonstrates normal low-resistance arterial and venous waveforms. Other findings No abnormal free fluid. IMPRESSION: Heterogeneous uterine fibroid. Electronically Signed   By: Aram Candela M.D.   On: 11/03/2022 21:12   CT ABDOMEN PELVIS W CONTRAST  Result Date: 10/31/2022 CLINICAL DATA:  Left lower quadrant  abdominal pain. EXAM: CT ABDOMEN AND PELVIS WITH CONTRAST TECHNIQUE: Multidetector CT imaging of the abdomen and pelvis was performed using the standard protocol following bolus administration of intravenous contrast. RADIATION DOSE REDUCTION: This exam was performed according to the departmental dose-optimization program which includes automated exposure control, adjustment of the mA and/or kV according to patient size and/or use of iterative reconstruction technique. CONTRAST:  OMNIPAQUE IOHEXOL 300 MG/ML  SOLN COMPARISON:  CT abdomen pelvis dated 01/24/2018. FINDINGS: Lower chest: The visualized lung bases are clear. No intra-abdominal free air or free fluid. Hepatobiliary: The liver is unremarkable. No biliary dilatation. The gallbladder is unremarkable. Pancreas: Unremarkable. No pancreatic ductal dilatation or surrounding inflammatory changes. Spleen: Normal in size without focal abnormality. Adrenals/Urinary Tract: The adrenal glands are unremarkable. Subcentimeter left renal interpolar hypodense focus is too small to characterize. There is no hydronephrosis on either side. There is symmetric enhancement and excretion of contrast by both kidneys. The visualized ureters and urinary bladder appear unremarkable. Stomach/Bowel: Several small scattered colonic diverticula without active inflammatory changes. There is no bowel obstruction or active inflammation. Appendectomy. Vascular/Lymphatic: Moderate aortoiliac atherosclerotic disease. The IVC is unremarkable. No portal venous gas. There is no adenopathy. Reproductive: The uterus is anteverted. Ill-defined and partially calcified uterine fibroids. No adnexal masses. Other: None Musculoskeletal: Osteopenia with degenerative changes of the spine. No acute osseous pathology. IMPRESSION: 1. No acute intra-abdominal or pelvic pathology. 2. Colonic diverticulosis. No bowel obstruction. 3. Uterine fibroids. 4.  Aortic Atherosclerosis (ICD10-I70.0).  Electronically Signed   By: Elgie Collard M.D.   On: 10/31/2022 19:27   DG Chest Port 1 View  Result Date: 10/31/2022 CLINICAL DATA:  Questionable sepsis. EXAM: PORTABLE CHEST 1 VIEW COMPARISON:  May 11, 2021 FINDINGS: The heart size and mediastinal contours are within normal limits. Both lungs are clear. The visualized skeletal structures are unremarkable. IMPRESSION: No active disease. Electronically Signed   By: Ted Mcalpine M.D.   On: 10/31/2022 17:58    Microbiology: Results for orders placed or performed during the hospital encounter of 10/31/22  Urine Culture     Status: Abnormal   Collection Time: 10/31/22  3:57 PM   Specimen: Urine, Clean Catch  Result Value Ref Range Status   Specimen Description   Final    URINE, CLEAN CATCH Performed at [facility], [address] [place], [place] [address]    Special Requests   Final    NONE Performed at [facility], [address], [place], [place] [address]    Culture (A)  Final    <10,000 COLONIES/mL INSIGNIFICANT GROWTH Performed at [facility], [address], [place], [place] [address]    Report Status 11/02/2022 FINAL  Final  Blood Culture (routine x 2)     Status: None  Collection Time: 10/31/22  5:03 PM   Specimen: BLOOD  Result Value Ref Range Status   Specimen Description   Final    BLOOD SITE NOT SPECIFIED Performed at [facility], [address] [place], [place] [address]    Special Requests   Final    BOTTLES DRAWN AEROBIC AND ANAEROBIC Blood Culture adequate volume Performed at [facility], [address], [place], [place] [address]    Culture   Final    NO GROWTH 5 DAYS Performed at [facility], [address], [place], [place] [address]    Report Status 11/05/2022 FINAL  Final  Blood Culture (routine x 2)     Status: None   Collection Time: 10/31/22  5:03 PM   Specimen: BLOOD  Result Value Ref Range Status    Specimen Description   Final    BLOOD SITE NOT SPECIFIED Performed at [facility], [address] [place], [place] [address]    Special Requests   Final    BOTTLES DRAWN AEROBIC AND ANAEROBIC Blood Culture results may not be optimal due to an excessive volume of blood received in culture bottles Performed at [facility], [address] [place], [place] [address]    Culture   Final    NO GROWTH 5 DAYS Performed at [facility], [address] [place], [place] [address]    Report Status 11/05/2022 FINAL  Final    Labs: CBC: Recent Labs  Lab 10/31/22 1703 11/03/22 1450 11/04/22 0537  WBC 7.7 8.2 8.1  HGB 15.2* 14.9 13.4  HCT 43.0 42.1 37.3  MCV 97.7 96.3 94.2  PLT 393 409* 317   Basic Metabolic Panel: Recent Labs  Lab 10/31/22 1703 11/03/22 1450 11/03/22 2115 11/04/22 0537 11/05/22 0751 11/06/22 0531  NA 130* 124*  --  124* 125* 126*  K 2.8* 2.9*  --  3.0* 3.2* 3.7  CL 93* 87*  --  92* 93* 96*  CO2 25 24  --  21* 23 22  GLUCOSE 107* 101*  --  87 78 83  BUN 12 13  --  11 12 13   CREATININE 0.78 0.85  --  0.62 0.67 0.53  CALCIUM 9.2 8.9  --  8.5* 8.5* 8.9  MG  --   --  2.2  --  2.0  --    Liver Function Tests: Recent Labs  Lab 10/31/22 1703 11/03/22 1450  AST 18 18  ALT 15 15  ALKPHOS 74 61  BILITOT 0.7 0.9  PROT 8.1 7.9  ALBUMIN 4.5 4.3   CBG: No results for input(s): "GLUCAP" in the last 168 hours.  Discharge time spent: greater than 30 minutes.  Signed: Thad Ranger, MD [facility] 11/06/2022

## 2022-11-06 NOTE — Plan of Care (Signed)

## 2022-11-18 ENCOUNTER — Inpatient Hospital Stay (INDEPENDENT_AMBULATORY_CARE_PROVIDER_SITE_OTHER): Payer: Self-pay | Admitting: Primary Care

## 2024-02-08 ENCOUNTER — Other Ambulatory Visit: Payer: Self-pay

## 2024-02-08 ENCOUNTER — Encounter (HOSPITAL_COMMUNITY): Payer: Self-pay

## 2024-02-08 ENCOUNTER — Emergency Department (HOSPITAL_COMMUNITY): Payer: Self-pay

## 2024-02-08 ENCOUNTER — Inpatient Hospital Stay (HOSPITAL_COMMUNITY)
Admission: EM | Admit: 2024-02-08 | Discharge: 2024-02-10 | DRG: 305 | Disposition: A | Discharge status: Home / Self Care | Payer: Self-pay | Attending: Internal Medicine | Admitting: Internal Medicine

## 2024-02-08 DIAGNOSIS — Z91013 Allergy to seafood: Secondary | ICD-10-CM

## 2024-02-08 DIAGNOSIS — F419 Anxiety disorder, unspecified: Secondary | ICD-10-CM | POA: Diagnosis present

## 2024-02-08 DIAGNOSIS — E782 Mixed hyperlipidemia: Secondary | ICD-10-CM

## 2024-02-08 DIAGNOSIS — K219 Gastro-esophageal reflux disease without esophagitis: Secondary | ICD-10-CM | POA: Diagnosis present

## 2024-02-08 DIAGNOSIS — N179 Acute kidney failure, unspecified: Secondary | ICD-10-CM | POA: Diagnosis present

## 2024-02-08 DIAGNOSIS — I5032 Chronic diastolic (congestive) heart failure: Secondary | ICD-10-CM | POA: Diagnosis present

## 2024-02-08 DIAGNOSIS — Z91148 Patient's other noncompliance with medication regimen for other reason: Secondary | ICD-10-CM

## 2024-02-08 DIAGNOSIS — F17210 Nicotine dependence, cigarettes, uncomplicated: Secondary | ICD-10-CM | POA: Diagnosis present

## 2024-02-08 DIAGNOSIS — I130 Hypertensive heart and chronic kidney disease with heart failure and stage 1 through stage 4 chronic kidney disease, or unspecified chronic kidney disease: Secondary | ICD-10-CM | POA: Diagnosis present

## 2024-02-08 DIAGNOSIS — Z91014 Allergy to mammalian meats: Secondary | ICD-10-CM

## 2024-02-08 DIAGNOSIS — R079 Chest pain, unspecified: Secondary | ICD-10-CM

## 2024-02-08 DIAGNOSIS — E890 Postprocedural hypothyroidism: Secondary | ICD-10-CM | POA: Diagnosis present

## 2024-02-08 DIAGNOSIS — Z79899 Other long term (current) drug therapy: Secondary | ICD-10-CM

## 2024-02-08 DIAGNOSIS — Z8419 Family history of other disorders of kidney and ureter: Secondary | ICD-10-CM

## 2024-02-08 DIAGNOSIS — Z8249 Family history of ischemic heart disease and other diseases of the circulatory system: Secondary | ICD-10-CM

## 2024-02-08 DIAGNOSIS — E0500 Thyrotoxicosis with diffuse goiter without thyrotoxic crisis or storm: Secondary | ICD-10-CM | POA: Diagnosis present

## 2024-02-08 DIAGNOSIS — Z79890 Hormone replacement therapy (postmenopausal): Secondary | ICD-10-CM

## 2024-02-08 DIAGNOSIS — Z6830 Body mass index (BMI) 30.0-30.9, adult: Secondary | ICD-10-CM

## 2024-02-08 DIAGNOSIS — E785 Hyperlipidemia, unspecified: Secondary | ICD-10-CM | POA: Diagnosis present

## 2024-02-08 DIAGNOSIS — I5A Non-ischemic myocardial injury (non-traumatic): Secondary | ICD-10-CM | POA: Diagnosis present

## 2024-02-08 DIAGNOSIS — Z7982 Long term (current) use of aspirin: Secondary | ICD-10-CM

## 2024-02-08 DIAGNOSIS — F32A Depression, unspecified: Secondary | ICD-10-CM | POA: Diagnosis present

## 2024-02-08 DIAGNOSIS — Z833 Family history of diabetes mellitus: Secondary | ICD-10-CM

## 2024-02-08 DIAGNOSIS — Z1152 Encounter for screening for COVID-19: Secondary | ICD-10-CM

## 2024-02-08 DIAGNOSIS — T381X6A Underdosing of thyroid hormones and substitutes, initial encounter: Secondary | ICD-10-CM | POA: Diagnosis present

## 2024-02-08 DIAGNOSIS — Z5971 Insufficient health insurance coverage: Secondary | ICD-10-CM

## 2024-02-08 DIAGNOSIS — I160 Hypertensive urgency: Principal | ICD-10-CM | POA: Diagnosis present

## 2024-02-08 DIAGNOSIS — E876 Hypokalemia: Secondary | ICD-10-CM | POA: Diagnosis present

## 2024-02-08 DIAGNOSIS — Z23 Encounter for immunization: Secondary | ICD-10-CM

## 2024-02-08 DIAGNOSIS — N189 Chronic kidney disease, unspecified: Secondary | ICD-10-CM | POA: Diagnosis present

## 2024-02-08 DIAGNOSIS — I10 Essential (primary) hypertension: Secondary | ICD-10-CM

## 2024-02-08 DIAGNOSIS — Z789 Other specified health status: Secondary | ICD-10-CM

## 2024-02-08 DIAGNOSIS — Z888 Allergy status to other drugs, medicaments and biological substances status: Secondary | ICD-10-CM

## 2024-02-08 DIAGNOSIS — E669 Obesity, unspecified: Secondary | ICD-10-CM | POA: Diagnosis present

## 2024-02-08 LAB — BASIC METABOLIC PANEL WITH GFR
Anion gap: 12 (ref 5–15)
BUN: 12 mg/dL (ref 8–23)
CO2: 25 mmol/L (ref 22–32)
Calcium: 9 mg/dL (ref 8.9–10.3)
Chloride: 101 mmol/L (ref 98–111)
Creatinine, Ser: 1.27 mg/dL — ABNORMAL HIGH (ref 0.44–1.00)
GFR, Estimated: 47 mL/min — ABNORMAL LOW (ref >60)
Glucose, Bld: 98 mg/dL (ref 70–99)
Potassium: 3.2 mmol/L — ABNORMAL LOW (ref 3.5–5.1)
Sodium: 137 mmol/L (ref 135–145)

## 2024-02-08 LAB — RESP PANEL BY RT-PCR (RSV, FLU A&B, COVID)  RVPGX2
Influenza A by PCR: NEGATIVE
Influenza B by PCR: NEGATIVE
Resp Syncytial Virus by PCR: NEGATIVE
SARS Coronavirus 2 by RT PCR: NEGATIVE

## 2024-02-08 LAB — CBC
HCT: 39.5 % (ref 36.0–46.0)
Hemoglobin: 13.4 g/dL (ref 12.0–15.0)
MCH: 33.8 pg (ref 26.0–34.0)
MCHC: 33.9 g/dL (ref 30.0–36.0)
MCV: 99.7 fL (ref 80.0–100.0)
Platelets: 293 K/uL (ref 150–400)
RBC: 3.96 MIL/uL (ref 3.87–5.11)
RDW: 13.4 % (ref 11.5–15.5)
WBC: 7.5 K/uL (ref 4.0–10.5)
nRBC: 0 % (ref 0.0–0.2)

## 2024-02-08 LAB — URINALYSIS, ROUTINE W REFLEX MICROSCOPIC
Bilirubin Urine: NEGATIVE
Glucose, UA: NEGATIVE mg/dL
Hgb urine dipstick: NEGATIVE
Ketones, ur: NEGATIVE mg/dL
Leukocytes,Ua: NEGATIVE
Nitrite: NEGATIVE
Protein, ur: NEGATIVE mg/dL
Specific Gravity, Urine: 1.01 (ref 1.005–1.030)
pH: 7 (ref 5.0–8.0)

## 2024-02-08 LAB — TROPONIN T, HIGH SENSITIVITY
Troponin T High Sensitivity: 76 ng/L — ABNORMAL HIGH (ref 0–19)
Troponin T High Sensitivity: 100 ng/L (ref 0–19)

## 2024-02-08 LAB — TSH: TSH: 60.9 u[IU]/mL — ABNORMAL HIGH (ref 0.350–4.500)

## 2024-02-08 LAB — T4, FREE: Free T4: <0.25 ng/dL — ABNORMAL LOW (ref 0.80–2.00)

## 2024-02-08 MED ORDER — GABAPENTIN 100 MG PO CAPS
200.0000 mg | ORAL_CAPSULE | Freq: Every day | ORAL | Status: DC
  Administered 2024-02-09: 21:00:00 200 mg via ORAL
  Filled 2024-02-08: qty 2

## 2024-02-08 MED ORDER — EZETIMIBE 10 MG PO TABS
10.0000 mg | ORAL_TABLET | Freq: Every day | ORAL | Status: AC
  Administered 2024-02-09 – 2024-02-10 (×2): 10 mg via ORAL
  Filled 2024-02-08 (×2): qty 1

## 2024-02-08 MED ORDER — LEVOTHYROXINE SODIUM 100 MCG PO TABS
100.0000 ug | ORAL_TABLET | Freq: Every day | ORAL | Status: DC
  Administered 2024-02-09 – 2024-02-10 (×2): 100 ug via ORAL
  Filled 2024-02-08 (×2): qty 1

## 2024-02-08 MED ORDER — CYCLOBENZAPRINE HCL 10 MG PO TABS
5.0000 mg | ORAL_TABLET | Freq: Three times a day (TID) | ORAL | Status: DC | PRN
  Filled 2024-02-08: qty 1

## 2024-02-08 MED ORDER — ONDANSETRON HCL 4 MG/2ML IJ SOLN: 4.0000 mg | Freq: Four times a day (QID) | INTRAMUSCULAR | Status: DC | PRN

## 2024-02-08 MED ORDER — HYDRALAZINE HCL 20 MG/ML IJ SOLN
10.0000 mg | Freq: Four times a day (QID) | INTRAMUSCULAR | Status: DC | PRN
  Administered 2024-02-09: 17:00:00 10 mg via INTRAVENOUS
  Filled 2024-02-08: qty 1

## 2024-02-08 MED ORDER — ASPIRIN 81 MG PO TBEC
81.0000 mg | DELAYED_RELEASE_TABLET | Freq: Every day | ORAL | Status: DC
  Administered 2024-02-09 – 2024-02-10 (×2): 81 mg via ORAL
  Filled 2024-02-08 (×2): qty 1

## 2024-02-08 MED ORDER — PANTOPRAZOLE SODIUM 40 MG PO TBEC
40.0000 mg | DELAYED_RELEASE_TABLET | Freq: Every day | ORAL | Status: DC
  Administered 2024-02-09 – 2024-02-10 (×2): 40 mg via ORAL
  Filled 2024-02-08 (×2): qty 1

## 2024-02-08 MED ORDER — ASPIRIN 325 MG PO TBEC
325.0000 mg | DELAYED_RELEASE_TABLET | Freq: Once | ORAL | Status: AC
  Administered 2024-02-08: 23:00:00 325 mg via ORAL
  Filled 2024-02-08: qty 1

## 2024-02-08 MED ORDER — HYDRALAZINE HCL 25 MG PO TABS
25.0000 mg | ORAL_TABLET | Freq: Three times a day (TID) | ORAL | Status: DC
  Administered 2024-02-09 – 2024-02-10 (×4): 25 mg via ORAL
  Filled 2024-02-08 (×4): qty 1

## 2024-02-08 MED ORDER — POTASSIUM CHLORIDE CRYS ER 20 MEQ PO TBCR
40.0000 meq | EXTENDED_RELEASE_TABLET | ORAL | Status: AC
  Administered 2024-02-09 (×2): 40 meq via ORAL
  Filled 2024-02-08 (×2): qty 2

## 2024-02-08 MED ORDER — AMLODIPINE BESYLATE 5 MG PO TABS
5.0000 mg | ORAL_TABLET | Freq: Every day | ORAL | Status: DC
  Administered 2024-02-09: 09:00:00 5 mg via ORAL
  Filled 2024-02-08: qty 1

## 2024-02-08 MED ORDER — FUROSEMIDE 20 MG PO TABS: 20.0000 mg | ORAL_TABLET | Freq: Every day | ORAL | Status: DC

## 2024-02-08 MED ORDER — ACETAMINOPHEN 325 MG PO TABS: 650.0000 mg | ORAL_TABLET | ORAL | Status: DC | PRN

## 2024-02-08 NOTE — H&P (Signed)
 History and Physical    Sheri Villarreal FMW:[id number] DOB: 1960-11-05 DOA: 02/08/2024  PCP: Pcp, No  Patient coming from: Home  I have personally briefly reviewed patient's old medical records in  [facility]  Chief Complaint: Chest pain  HPI: Sheri Villarreal is a 63 y.o. female with medical history significant of anxiety, hypertension, hyperlipidemia, chronic diastolic congestive heart failure, hypothyroidism, GERD, peripheral neuropathy presented to ED with a complaint of chest pain.  According to patient, she started having chest pain around noon while she was just trying to lay on her bed.  This wound was associated with dizziness, shortness of breath and some nausea but no syncope or passing out or any palpitations.  After 10 minutes, she called her neighbor who came to visit her.  She waited some more time but since the pain and other symptoms continued, she came to the emergency department.  Unfortunately, she had to wait almost 10 hours in the triage area before being seen by the ED physician.  Her symptoms lasted for about 2 hours or so and had already resolved by the time she was seen by the EDP.  She denies any fever, chills, sweating, palpitation, any problem with urination or with bowel movement.  When I saw her, her blood pressure was 224 systolic on the monitor.  I was able to find out from her that she has not been taking any of her medications for about a year due to not having insurance.  She tells me that she finally qualified for Medicare just 4 days ago when she turned 63 year old.  She has not been checking her blood pressure either.  ED Course: Upon arrival to ED, significantly hypertensive with a blood pressure of 166/116 but otherwise hemodynamically stable.  She is was afebrile.  CBC unremarkable, mild hypokalemia 3.2 on BMP with mild AKI.  TSH significantly elevated and T4 very low.  Tested negative for influenza, RSV and COVID.  Patient was chest pain-free when  evaluated by ED physician therefore was not given any nitroglycerin .  She received full dose of aspirin .  Review of Systems: As per HPI otherwise negative.    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 x 9 yr.   Hypertensive urgency    under control with med., has been on med. x 9 yr.    HYPERTHYROIDISM 09/05/2007   Qualifier: Diagnosis of  By: Kassie MD, Sean A    Hypothyroidism 02/24/2015   Pain in the chest    Palpitations 05/19/2017   Thyroid  disease    Tobacco abuse    Weakness 02/18/2016    Past Surgical History:  Procedure Laterality Date   APPENDECTOMY     CARPAL TUNNEL RELEASE Right 07/04/2013   Procedure: RIGHT CARPAL TUNNEL RELEASE;  Surgeon: Arley JONELLE Curia, MD;  Location: [facility];  Service: Orthopedics;  Laterality: Right;   HERNIA REPAIR     INGUINAL HERNIA REPAIR     KNEE ARTHROSCOPY Right  reports that she has been smoking cigarettes. She has a 15 pack-year smoking history. She has never used smokeless tobacco. She reports current alcohol use. She reports that she does not use drugs.  Allergies[1]  Family History  Problem Relation Age of Onset   Hypertension Mother    Hypertension Father    Kidney disease Father    Diverticulosis Father    Diabetes Sister    Diabetes Brother     Prior to Admission medications  Medication Sig Start Date End Date Taking? Authorizing Provider  acetaminophen  (TYLENOL ) 500 MG tablet Take 1,000 mg by mouth every 6 (six) hours as needed for mild pain or headache.    [provider]   amLODipine  (NORVASC ) 5 MG tablet Take 1 tablet (5 mg total) by mouth daily. 11/07/22   Rai, Nydia POUR, MD  aspirin  EC 81 MG tablet Take 81 mg by mouth daily. 02/25/15   [provider]  cyclobenzaprine  (FLEXERIL ) 5 MG tablet Take 1 tablet (5 mg total) by mouth 3 (three) times daily as needed for muscle spasms. 11/06/22   Rai, Nydia POUR, MD  ezetimibe  (ZETIA ) 10 MG tablet Take 1 tablet (10 mg total) by mouth daily. 09/23/22   Mayers, Cari S, PA-C  furosemide  (LASIX ) 20 MG tablet Take 1 tablet (20 mg total) by mouth daily for 3 days. 11/07/22 11/10/22  Rai, Nydia POUR, MD  gabapentin  (NEURONTIN ) 100 MG capsule Take 2 capsules (200 mg total) by mouth at bedtime. 11/06/22   Rai, Nydia POUR, MD  hydrALAZINE  (APRESOLINE ) 25 MG tablet Take 1 tablet (25 mg total) by mouth 3 (three) times daily. 11/06/22   Rai, Nydia POUR, MD  levothyroxine  (SYNTHROID ) 75 MCG tablet Take 1 tablet (75 mcg total) by mouth daily at 6 (six) AM. 11/07/22   Rai, Ripudeep K, MD  Magnesium  500 MG TABS Take 500 mg by mouth at bedtime.    [provider]  ondansetron  (ZOFRAN ) 4 MG tablet Take 1 tablet (4 mg total) by mouth every 6 (six) hours. Patient taking differently: Take 4 mg by mouth every 6 (six) hours as needed for nausea or vomiting. 10/31/22   Bernis Ernst, PA-C  PRILOSEC  OTC 20 MG tablet Take 20 mg by mouth daily before breakfast.    [provider]  sodium chloride  1 g tablet Take 1 tablet (1 g total) by mouth 3 (three) times daily with meals. 11/06/22   Davia Nydia POUR, MD    Physical Exam: Vitals:   02/08/24 1308 02/08/24 1504 02/08/24 2127 02/08/24 2314  BP: (!) 166/116  (!) 178/111   Pulse: 80  68   Resp: 17  20   Temp:  98.7 F (37.1 C) 97.9 F (36.6 C)   TempSrc:  Oral    SpO2: 100%  98%   Weight:    79.4 kg  Height:    5' 3 (1.6 m)    Constitutional: NAD, calm, comfortable Vitals:   02/08/24 1308 02/08/24 1504 02/08/24 2127 02/08/24 2314  BP: (!) 166/116  (!) 178/111   Pulse: 80   68   Resp: 17  20   Temp:  98.7 F (37.1 C) 97.9 F (36.6 C)   TempSrc:  Oral    SpO2: 100%  98%   Weight:    79.4 kg  Height:    5' 3 (1.6 m)   Eyes: PERRL, lids and conjunctivae normal ENMT: Mucous membranes are moist. Posterior pharynx clear of any exudate or lesions.Normal dentition.  Neck: normal, supple,  no masses, no thyromegaly Respiratory: clear to auscultation bilaterally, no wheezing, no crackles. Normal respiratory effort. No accessory muscle use.  Cardiovascular: Regular rate and rhythm, no murmurs / rubs / gallops. No extremity edema. 2+ pedal pulses. No carotid bruits.  Abdomen: no tenderness, no masses palpated. No hepatosplenomegaly. Bowel sounds positive.  Musculoskeletal: no clubbing / cyanosis. No joint deformity upper and lower extremities. Good ROM, no contractures. Normal muscle tone.  Skin: no rashes, lesions, ulcers. No induration Neurologic: CN 2-12 grossly intact. Sensation intact, DTR normal. Strength 5/5 in all 4.  Psychiatric: Normal judgment and insight. Alert and oriented x 3. Normal mood.    Labs on Admission: I have personally reviewed following labs and imaging studies  CBC: Recent Labs  Lab 02/08/24 1601  WBC 7.5  HGB 13.4  HCT 39.5  MCV 99.7  PLT 293   Basic Metabolic Panel: Recent Labs  Lab 02/08/24 1601  NA 137  K 3.2*  CL 101  CO2 25  GLUCOSE 98  BUN 12  CREATININE 1.27*  CALCIUM  9.0   GFR: Estimated Creatinine Clearance: 44.1 mL/min (A) (by C-G formula based on SCr of 1.27 mg/dL (H)). Liver Function Tests: No results for input(s): AST, ALT, ALKPHOS, BILITOT, PROT, ALBUMIN in the last 168 hours. No results for input(s): LIPASE, AMYLASE in the last 168 hours. No results for input(s): AMMONIA in the last 168 hours. Coagulation Profile: No results for input(s): INR, PROTIME in the last 168 hours. Cardiac Enzymes: No results for input(s): CKTOTAL, CKMB, CKMBINDEX, TROPONINI in the last 168  hours. BNP (last 3 results) No results for input(s): PROBNP in the last 8760 hours. HbA1C: No results for input(s): HGBA1C in the last 72 hours. CBG: No results for input(s): GLUCAP in the last 168 hours. Lipid Profile: No results for input(s): CHOL, HDL, LDLCALC, TRIG, CHOLHDL, LDLDIRECT in the last 72 hours. Thyroid  Function Tests: Recent Labs    02/08/24 1606 02/08/24 2039  TSH 60.900*  --   FREET4  --  <0.25*   Anemia Panel: No results for input(s): VITAMINB12, FOLATE, FERRITIN, TIBC, IRON, RETICCTPCT in the last 72 hours. Urine analysis:    Component Value Date/Time   COLORURINE YELLOW 02/08/2024 1628   APPEARANCEUR CLEAR 02/08/2024 1628   LABSPEC 1.010 02/08/2024 1628   PHURINE 7.0 02/08/2024 1628   GLUCOSEU NEGATIVE 02/08/2024 1628   HGBUR NEGATIVE 02/08/2024 1628   BILIRUBINUR NEGATIVE 02/08/2024 1628   BILIRUBINUR moderate (A) 10/31/2022 1456   KETONESUR NEGATIVE 02/08/2024 1628   PROTEINUR NEGATIVE 02/08/2024 1628   UROBILINOGEN 0.2 10/31/2022 1456   UROBILINOGEN 0.2 09/11/2017 1202   NITRITE NEGATIVE 02/08/2024 1628   LEUKOCYTESUR NEGATIVE 02/08/2024 1628    Radiological Exams on Admission: DG Chest 2 View Result Date: 02/08/2024 EXAM: 2 VIEW(S) XRAY OF THE CHEST 02/08/2024 04:25:00 PM COMPARISON: 10/31/2022 CLINICAL HISTORY: chest tightness and shortness of breath FINDINGS: LUNGS AND PLEURA: Bilateral interstitial prominence. No focal pulmonary opacity. No pleural effusion. No pneumothorax. HEART AND MEDIASTINUM: Aortic atherosclerosis. Cardiomegaly. BONES AND SOFT TISSUES: Mild thoracic levoscoliosis. No acute osseous abnormality. IMPRESSION: 1. Cardiomegaly. 2. Bilateral interstitial prominence may represent mild interstitial edema . 3. Aortic atherosclerosis. Electronically signed by: Greig Pique MD 02/08/2024 04:29 PM EST RP Workstation: [id number]    EKG: Independently reviewed.  Sinus rhythm with T wave  abnormality.  Assessment/Plan Principal Problem:   Chest pain   Chest pain: Lasted for about 2 to 3 hours, she was chest pain-free when evaluated by ED physician.  EKG with T wave  abnormality.  Troponin/delta positive 76> 100.  Per my request, cardiology has been consulted.  Per ED physician, they have been told not to start patient on any heparin  as of yet until patient is evaluated by cardiology.  Awaiting their evaluation.  History of hypertension presented with hypertensive urgency: Blood pressure upon arrival 166/116, likely secondary to noncompliance with the medications.  PTA medication include amlodipine , hydralazine  and I have resumed both of them and will also place her on a as needed IV hydralazine .  Chronic diastolic congestive heart failure: Last echo was done in 2019 which showed grade 1 diastolic dysfunction.  Chest x-ray shows cardiomegaly with possible interstitial edema however patient clinically appears to be dry with no crackles and no hypoxia and no shortness of breath.  Her chest x-ray findings could very well be due to hypertensive urgency.  Will defer to cardiology for any diuresis.  Echo has been ordered.  Monitor on telemetry.  Hypokalemia: will order KCl.  AKI vs CKD stage IIIa: Baseline creatinine between 0.6-0.8 based on the labs available a year ago, currently one 1.27.  This could be her new baseline/CKD due to hypertensive nephropathy in the setting of uncontrolled hypertension.  Chest x-ray with possible pulmonary edema, may be having new CHF exacerbation.  Unable to provide IV fluids however avoid nephrotoxic agents and repeat labs in the morning.  Acquired hypothyroidism: Patient was supposed to be taking Synthroid  75 mcg but has not been taking for about a year.  TSH 60.9 free T4<0.25.  At this point in time, she will be considered Synthroid  nave and according to the guidelines, patient must be started on 1.6 mcg/kg if under age 80 but should be started on 25-50  mcg if over 65.  Since she just turned 63 and has significantly elevated TSH, I am going to check a middle path and start her on 100 mcg for now.  GERD: Resume PPI.  Hyperlipidemia: Resume Zetia .  Check lipid panel tomorrow.  DVT prophylaxis: SCDs Start: 02/08/24 2330 Code Status: Full code Family Communication: None present at bedside.  Plan of care discussed with patient in length and he verbalized understanding and agreed with it. Disposition Plan: Once medically optimized Consults called: Cardiology  Fredia Skeeter MD [facility]  *Please note that this is a verbal dictation therefore any spelling or grammatical errors are due to the Dragon Medical One system interpretation.  Please page via Amion and do not message via secure chat for urgent patient care matters. Secure chat can be used for non urgent patient care matters. 02/08/2024, 11:31 PM  To contact the attending provider between 7A-7P or the covering provider during after hours 7P-7A, please log into the web site [email or web address]     [1]  Allergies Allergen Reactions   Hydrocodone  Bit-Homatrop Mbr Itching and Other (See Comments)    Reaction to Hycodan cough syrup (Patient has taken Norco in the past with NO reaction)   Lipitor [Atorvastatin ] Other (See Comments)    Leg pain   Porcine (Pork) Protein-Containing Drug Products    Robaxin  [Methocarbamol ] Other (See Comments)    Developed sweating (forehead) shortly after taking; uncomfortable feeling   Shellfish Allergy

## 2024-02-08 NOTE — ED Notes (Signed)
 Discussed labs with EDP, Lawsing. New orders obtained.

## 2024-02-08 NOTE — ED Notes (Addendum)
 Pt endorses mild chest tightness; states she was having accompanied sob that has resolved; endorses generalized weakness; states she has been having vision issues, difficultly focusing x several weeks; denies recent illness  Pt gives verbal consent for mse

## 2024-02-08 NOTE — ED Triage Notes (Signed)
 Pt here for anxiety for 45 minutes. C/O chest tightness/nausea. Axox4.

## 2024-02-08 NOTE — Consult Note (Signed)
 Cardiology Consultation   Patient ID: Sheri Villarreal MRN: [id number]; DOB: 08-06-1960  Admit date: 02/08/2024 Date of Consult: 02/08/2024  PCP:  Freddrick, No   [facility] Providers Cardiologist:  None   { Click here to update MD or APP on Care Team, Refresh:1}     Patient Profile: Sheri Villarreal is a 63 y.o. female with a hx of *** who is being seen 02/08/2024 for the evaluation of *** at the request of ***.  History of Present Illness: Ms. 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 x 9 yr.   Hypertensive urgency    under control with med., has been on med. x 9 yr.    HYPERTHYROIDISM 09/05/2007   Qualifier: Diagnosis of  By: Kassie MD, Sean A    Hypothyroidism 02/24/2015   Pain in the chest    Palpitations 05/19/2017   Thyroid  disease    Tobacco abuse    Weakness 02/18/2016    Past Surgical History:  Procedure Laterality Date   APPENDECTOMY     CARPAL TUNNEL RELEASE Right 07/04/2013   Procedure: RIGHT CARPAL TUNNEL RELEASE;  Surgeon: Arley JONELLE Curia, MD;  Location: [facility];  Service: Orthopedics;  Laterality: Right;   HERNIA REPAIR     INGUINAL HERNIA REPAIR     KNEE ARTHROSCOPY Right      {Home Medications (Optional):21181}  Scheduled Meds:  [START ON 02/09/2024] amLODipine   5 mg Oral Daily   [START ON 02/09/2024] aspirin  EC  81 mg Oral Daily   [START ON 02/09/2024] ezetimibe   10 mg Oral Daily   gabapentin   200 mg Oral QHS   hydrALAZINE   25 mg Oral TID   [START ON 02/09/2024] levothyroxine   100 mcg  Oral Q0600   [START ON 02/09/2024] omeprazole   20 mg Oral QAC breakfast   potassium chloride   40 mEq Oral Q4H   Continuous Infusions:  PRN Meds: acetaminophen , cyclobenzaprine , hydrALAZINE , ondansetron  (ZOFRAN ) IV  Allergies:   Allergies[1]  Social History:   Social History   Socioeconomic History   Marital status: Single    Spouse name: Not on file   Number of children: Not on file   Years of education: Not on file   Highest education level: Some college, no degree  Occupational History   Not on file  Tobacco Use   Smoking status: Every Day    Current packs/day: 0.50    Average packs/day: 0.5 packs/day for 30.0 years (15.0 ttl pk-yrs)    Types: Cigarettes   Smokeless tobacco: Never   Tobacco comments:    7 cig./day  Vaping Use   Vaping status: Never Used  Substance and Sexual Activity   Alcohol use: Yes    Comment: infrequently/rare   Drug use: No   Sexual activity: Not on file  Other Topics Concern   Not on file  Social History Narrative   Lives alone   Social Drivers of Health   Tobacco Use: High  Risk (02/08/2024)   Patient History    Smoking Tobacco Use: Every Day    Smokeless Tobacco Use: Never    Passive Exposure: Not on file  Financial Resource Strain: Not on file  Food Insecurity: No Food Insecurity (11/03/2022)   Hunger Vital Sign    Worried About Running Out of Food in the Last Year: Never true    Ran Out of Food in the Last Year: Never true  Transportation Needs: No Transportation Needs (11/03/2022)   PRAPARE - Administrator, Civil Service (Medical): No    Lack of Transportation (Non-Medical): No  Physical Activity: Not on file  Stress: Not on file  Social Connections: Not on file  Intimate Partner Violence: Not At Risk (11/03/2022)   Humiliation, Afraid, Rape, and Kick questionnaire    Fear of Current or Ex-Partner: No    Emotionally Abused: No    Physically Abused: No    Sexually Abused: No  Depression (PHQ2-9): Low Risk  (09/22/2022)   Depression (PHQ2-9)    PHQ-2 Score: 3  Alcohol Screen: Not on file  Housing: Low Risk (11/03/2022)   Housing    Last Housing Risk Score: 0  Utilities: Not At Risk (11/03/2022)   AHC Utilities    Threatened with loss of utilities: No  Health Literacy: Not on file    Family History:    Family History  Problem Relation Age of Onset   Hypertension Mother    Hypertension Father    Kidney disease Father    Diverticulosis Father    Diabetes Sister    Diabetes Brother      ROS:  Please see the history of present illness.   All other ROS reviewed and negative.     Physical Exam/Data: Vitals:   02/08/24 1308 02/08/24 1504 02/08/24 2127 02/08/24 2314  BP: (!) 166/116  (!) 178/111   Pulse: 80  68   Resp: 17  20   Temp:  98.7 F (37.1 C) 97.9 F (36.6 C)   TempSrc:  Oral    SpO2: 100%  98%   Weight:    79.4 kg  Height:    5' 3 (1.6 m)   No intake or output data in the 24 hours ending 02/08/24 2339    02/08/2024   11:14 PM 11/03/2022    2:39 PM 09/22/2022    9:27 AM  Last 3 Weights  Weight (lbs) 175 lb 173 lb 174 lb  Weight (kg) 79.379 kg 78.472 kg 78.926 kg     Body mass index is 31 kg/m.  General:  Well nourished, well developed, in no acute distress*** HEENT: normal Neck: no JVD Vascular: No carotid bruits; Distal pulses 2+ bilaterally Cardiac:  normal S1, S2; RRR; no murmur *** Lungs:  clear to auscultation bilaterally, no wheezing, rhonchi or rales  Abd: soft, nontender, no hepatomegaly  Ext: no edema Musculoskeletal:  No deformities, BUE and BLE strength normal and equal Skin: warm and dry  Neuro:  CNs 2-12 intact, no focal abnormalities noted Psych:  Normal affect   EKG:  The EKG was personally reviewed and demonstrates:  *** Telemetry:  Telemetry was personally reviewed and demonstrates:  ***  Relevant CV Studies: reviewed  Laboratory Data: High Sensitivity Troponin:  No results for input(s): TROPONINIHS in the last 720 hours.  Recent  Labs  Lab 02/08/24 1601 02/08/24 2041  TRNPT 76* 100*      Chemistry Recent Labs  Lab 02/08/24 1601  NA 137  K 3.2*  CL 101  CO2 25  GLUCOSE 98  BUN 12  CREATININE 1.27*  CALCIUM  9.0  GFRNONAA 47*  ANIONGAP 12    No results for input(s): PROT, ALBUMIN, AST, ALT, ALKPHOS, BILITOT in the last 168 hours. Lipids No results for input(s): CHOL, TRIG, HDL, LABVLDL, LDLCALC, CHOLHDL in the last 168 hours.  Hematology Recent Labs  Lab 02/08/24 1601  WBC 7.5  RBC 3.96  HGB 13.4  HCT 39.5  MCV 99.7  MCH 33.8  MCHC 33.9  RDW 13.4  PLT 293   Thyroid   Recent Labs  Lab 02/08/24 1606 02/08/24 2039  TSH 60.900*  --   FREET4  --  <0.25*    BNPNo results for input(s): BNP, PROBNP in the last 168 hours.  DDimer No results for input(s): DDIMER in the last 168 hours.  Radiology/Studies:  DG Chest 2 View Result Date: 02/08/2024 EXAM: 2 VIEW(S) XRAY OF THE CHEST 02/08/2024 04:25:00 PM COMPARISON: 10/31/2022 CLINICAL HISTORY: chest tightness and shortness of breath FINDINGS: LUNGS AND PLEURA: Bilateral interstitial prominence. No focal pulmonary opacity. No pleural effusion. No pneumothorax. HEART AND MEDIASTINUM: Aortic atherosclerosis. Cardiomegaly. BONES AND SOFT TISSUES: Mild thoracic levoscoliosis. No acute osseous abnormality. IMPRESSION: 1. Cardiomegaly. 2. Bilateral interstitial prominence may represent mild interstitial edema . 3. Aortic atherosclerosis. Electronically signed by: Greig Pique MD 02/08/2024 04:29 PM EST RP Workstation: [id number]     Assessment and Plan: ***   Risk Assessment/Risk Scores: {Complete the following score calculators/questions to meet required metrics.  Press F2         :[id number]}   {Is the patient being seen for unstable angina, ACS, NSTEMI or STEMI?:[phone]} {Does this patient have CHF or CHF symptoms?      :[id number]} {Does this patient have ATRIAL FIBRILLATION?[phone]}  {Are we signing off  today?:[id number]}  For questions or updates, please contact [facility] Please consult [email or web address] for contact info under    {TIP  Split Shared Billing  Do NOT delete any part of this including brackets If split shared billing is based upon MDM, disregard If billing will be based upon TIME you MUST document the number of minutes and a detailed list of what was done in that time in the following format Example - I spent ** minutes seeing this patient. During that time I reviewed their history, evaluated their symptoms, reviewed available labs, EKGs, studies, performed an exam and formulated an assessment and plan   :1} {Select this only if you need to document critical care time (Optional):[phone]} Signed, Laurene DELENA Pacini, MD  02/08/2024 11:39 PM     [1]  Allergies Allergen Reactions   Hydrocodone  Bit-Homatrop Mbr Itching and Other (See Comments)    Reaction to Hycodan cough syrup (Patient has taken Norco in the past with NO reaction)   Lipitor [Atorvastatin ] Other (See Comments)    Leg pain   Porcine (Pork) Protein-Containing Drug Products    Robaxin  [Methocarbamol ] Other (See Comments)    Developed sweating (forehead) shortly after taking; uncomfortable feeling   Shellfish Allergy

## 2024-02-08 NOTE — ED Provider Triage Note (Signed)
 Emergency Medicine Provider Triage Evaluation Note  Sheri Villarreal , a 63 y.o. female  was evaluated in triage.  Pt complains of multiple complaints. Endorses shortness of breath, chest tightness, fatigue, and anxiety ongoing today. Also endorses frequent problems with her vision including difficulty focusing. Reports she has a history of a thyroidectomy and has not had access to her levothyroxine  in over one year. Denies vomiting, abdominal pain, diarrhea, fever. Reports feeling 80% better at time of my evaluation.  Review of Systems  Positive: As above Negative: As above  Physical Exam  BP (!) 166/116   Pulse 80   Temp 98.7 F (37.1 C) (Oral)   Resp 17   SpO2 100%  Gen:   Awake, no distress   Resp:  Normal effort  MSK:   Moves extremities without difficulty  Other:    Medical Decision Making  Medically screening exam initiated at 4:02 PM.  Appropriate orders placed.  Sheri Villarreal was informed that the remainder of the evaluation will be completed by another provider, this initial triage assessment does not replace that evaluation, and the importance of remaining in the ED until their evaluation is complete.     Sheri Villarreal Rocky SAILOR, [place] 02/08/24 [phone]

## 2024-02-08 NOTE — ED Provider Notes (Incomplete)
 [facility]-EMERGENCY DEPT [facility] Emergency Department Provider Note MRN:  [id number]  Arrival date & time: 02/09/2024     Chief Complaint   Anxiety and Chest Pain   History of Present Illness   Sheri Villarreal is a 63 y.o. year-old female presents to the ED with chief complaint of chest pain, SOB, anxiousness, and feeling hot.  Had some associated nausea.  States that symptoms lasted about 10 minutes before calling her friend.  States that her symptoms lasted for a few hours.  States that she is feeling a little better now.  States that her symptoms were so bad it was hard for her to speak.  She states that she had a lot of tightness in the chest.  States that this felt very different from her normal anxiety spells. Had thoughts of impending doom. Took 3 81mg  ASA PTA.  History provided by patient.   Review of Systems  Pertinent positive and negative review of systems noted in HPI.    Physical Exam   Vitals:   02/08/24 2127 02/08/24 2328  BP: (!) 178/111 (!) 214/112  Pulse: 68   Resp: 20 13  Temp: 97.9 F (36.6 C)   SpO2: 98% 100%    CONSTITUTIONAL:  non toxic-appearing, NAD NEURO:  Alert and oriented x 3, CN 3-12 grossly intact EYES:  eyes equal and reactive ENT/NECK:  Supple, no stridor  CARDIO:  normal rate, regular rhythm, appears well-perfused  PULM:  No respiratory distress, CTAB GI/GU:  non-distended, no focal tenderness MSK/SPINE:  No gross deformities, no edema, moves all extremities  SKIN:  no rash, atraumatic   *Additional and/or pertinent findings included in MDM below  Diagnostic and Interventional Summary    EKG Interpretation Date/Time:  Tuesday February 08 2024 16:00:26 EST Ventricular Rate:  79 PR Interval:  162 QRS Duration:  64 QT Interval:  372 QTC Calculation: 426 R Axis:   43  Text Interpretation: Normal sinus rhythm Septal infarct , age undetermined T wave inversions in inferior leads Compared with prior EKG from 11/03/2022  Confirmed by Gennaro Bouchard ([id number]) on 02/08/2024 11:37:54 PM       Labs Reviewed  BASIC METABOLIC PANEL WITH GFR - Abnormal; Notable for the following components:      Result Value   Potassium 3.2 (*)    Creatinine, Ser 1.27 (*)    GFR, Estimated 47 (*)    All other components within normal limits  TSH - Abnormal; Notable for the following components:   TSH 60.900 (*)    All other components within normal limits  T4, FREE - Abnormal; Notable for the following components:   Free T4 <0.25 (*)    All other components within normal limits  PRO BRAIN NATRIURETIC PEPTIDE - Abnormal; Notable for the following components:   Pro Brain Natriuretic Peptide 3,673.0 (*)    All other components within normal limits  TROPONIN T, HIGH SENSITIVITY - Abnormal; Notable for the following components:   Troponin T High Sensitivity 76 (*)    All other components within normal limits  TROPONIN T, HIGH SENSITIVITY - Abnormal; Notable for the following components:   Troponin T High Sensitivity 100 (*)    All other components within normal limits  TROPONIN T, HIGH SENSITIVITY - Abnormal; Notable for the following components:   Troponin T High Sensitivity 97 (*)    All other components within normal limits  TROPONIN T, HIGH SENSITIVITY - Abnormal; Notable for the following components:   Troponin T High Sensitivity 97 (*)  All other components within normal limits  RESP PANEL BY RT-PCR (RSV, FLU A&B, COVID)  RVPGX2  CBC  URINALYSIS, ROUTINE W REFLEX MICROSCOPIC  T3, FREE  HIV ANTIBODY (ROUTINE TESTING W REFLEX)  LIPID PANEL  TROPONIN T, HIGH SENSITIVITY    DG Chest 2 View  Final Result      Medications  aspirin  EC tablet 81 mg (has no administration in time range)  amLODipine  (NORVASC ) tablet 5 mg (has no administration in time range)  ezetimibe  (ZETIA ) tablet 10 mg (has no administration in time range)  hydrALAZINE  (APRESOLINE ) tablet 25 mg (0 mg Oral Hold 02/09/24 0009)  levothyroxine   (SYNTHROID ) tablet 100 mcg (has no administration in time range)  pantoprazole  (PROTONIX ) EC tablet 40 mg (has no administration in time range)  cyclobenzaprine  (FLEXERIL ) tablet 5 mg (has no administration in time range)  gabapentin  (NEURONTIN ) capsule 200 mg (0 mg Oral Hold 02/09/24 0009)  acetaminophen  (TYLENOL ) tablet 650 mg (has no administration in time range)  ondansetron  (ZOFRAN ) injection 4 mg (has no administration in time range)  potassium chloride  SA (KLOR-CON  M) CR tablet 40 mEq (40 mEq Oral Given 02/09/24 0015)  hydrALAZINE  (APRESOLINE ) injection 10 mg (has no administration in time range)  aspirin  EC tablet 325 mg (325 mg Oral Given 02/08/24 2313)     Procedures  /  Critical Care .Critical Care  Performed by: Vicky Charleston, PA-C Authorized by: Vicky Charleston, PA-C   Critical care provider statement:    Critical care time (minutes):  45   Critical care was necessary to treat or prevent imminent or life-threatening deterioration of the following conditions:  Circulatory failure   Critical care was time spent personally by me on the following activities:  Development of treatment plan with patient or surrogate, discussions with consultants, evaluation of patient's response to treatment, examination of patient, ordering and review of laboratory studies, ordering and review of radiographic studies, ordering and performing treatments and interventions, pulse oximetry, re-evaluation of patient's condition and review of old charts   ED Course and Medical Decision Making  I have reviewed the triage vital signs, the nursing notes, and pertinent available records from the EMR.  Social Determinants Affecting Complexity of Care: Patient has no clinically significant social determinants affecting this chief complaint..   ED Course:    Medical Decision Making Patient presenting with chest pain shortness of breath and nausea and anxiousness that started this morning.  Her  symptoms lasted for a couple of hours and then resolved on their own while she was in the waiting room.  She did take (3) 81 mg aspirin  tablets  prior to arrival.  She does not currently have a PCP. Therefore, she doesn't have anyone currently prescribing her levothyroxine  or antihypertensives.    Given concern for typical sounding symptoms and elevated trops, feel that she should be admitted for further monitoring and workup.  Will consult hospitalist.    Risk Decision regarding hospitalization.         Consultants: I consulted with Hospitalist, Dr. Vernon, who is appreciated for admitting. I consulted with Dr. Gail, cardiology, who is appreciated for consulting.  Treatment and Plan: Patient's exam and diagnostic results are concerning for chest pain.  Feel that patient will need admission to the hospital for further treatment and evaluation.    Final Clinical Impressions(s) / ED Diagnoses     ICD-10-CM   1. Chest pain, unspecified type  R07.9     2. Statin intolerance  Z78.9 ezetimibe  (ZETIA ) tablet 10 mg  3. Mixed hyperlipidemia  E78.2 ezetimibe  (ZETIA ) tablet 10 mg    4. Hypertension, unspecified type  I10     5. Hypothyroidism, unspecified type  E03.9       ED Discharge Orders     None         Discharge Instructions Discussed with and Provided to Patient:   Discharge Instructions   None      Vicky Charleston, PA-C 02/09/24 0113

## 2024-02-09 ENCOUNTER — Observation Stay (HOSPITAL_COMMUNITY): Payer: Self-pay

## 2024-02-09 DIAGNOSIS — I10 Essential (primary) hypertension: Secondary | ICD-10-CM

## 2024-02-09 DIAGNOSIS — E039 Hypothyroidism, unspecified: Secondary | ICD-10-CM

## 2024-02-09 DIAGNOSIS — I160 Hypertensive urgency: Principal | ICD-10-CM

## 2024-02-09 DIAGNOSIS — R079 Chest pain, unspecified: Secondary | ICD-10-CM

## 2024-02-09 LAB — TROPONIN T, HIGH SENSITIVITY
Troponin T High Sensitivity: 78 ng/L — ABNORMAL HIGH (ref 0–19)
Troponin T High Sensitivity: 97 ng/L — ABNORMAL HIGH (ref 0–19)
Troponin T High Sensitivity: 97 ng/L — ABNORMAL HIGH (ref 0–19)

## 2024-02-09 LAB — ECHOCARDIOGRAM COMPLETE
Area-P 1/2: 3.17 cm2
Calc EF: 39.1 %
Height: 63 in
S' Lateral: 3.7 cm
Single Plane A2C EF: 39.8 %
Single Plane A4C EF: 38.2 %
Weight: 2800 [oz_av]

## 2024-02-09 LAB — LIPID PANEL
Cholesterol: 235 mg/dL — ABNORMAL HIGH (ref 0–200)
HDL: 59 mg/dL (ref >40)
LDL Cholesterol: 143 mg/dL — ABNORMAL HIGH (ref 0–99)
Total CHOL/HDL Ratio: 4 ratio
Triglycerides: 162 mg/dL — ABNORMAL HIGH (ref <150)
VLDL: 32 mg/dL (ref 0–40)

## 2024-02-09 LAB — PRO BRAIN NATRIURETIC PEPTIDE: Pro Brain Natriuretic Peptide: 3673 pg/mL — ABNORMAL HIGH (ref <300.0)

## 2024-02-09 LAB — HIV ANTIBODY (ROUTINE TESTING W REFLEX): HIV Screen 4th Generation wRfx: NONREACTIVE

## 2024-02-09 LAB — MRSA NEXT GEN BY PCR, NASAL: MRSA by PCR Next Gen: NOT DETECTED

## 2024-02-09 MED ORDER — PNEUMOCOCCAL 20-VAL CONJ VACC 0.5 ML IM SUSY
0.5000 mL | PREFILLED_SYRINGE | INTRAMUSCULAR | Status: DC
  Filled 2024-02-09 (×2): qty 0.5

## 2024-02-09 MED ORDER — LOSARTAN POTASSIUM 25 MG PO TABS
50.0000 mg | ORAL_TABLET | Freq: Every day | ORAL | Status: DC
  Administered 2024-02-09 – 2024-02-10 (×2): 50 mg via ORAL
  Filled 2024-02-09 (×2): qty 2
  Filled 2024-02-09: qty 1

## 2024-02-09 MED ORDER — AMLODIPINE BESYLATE 10 MG PO TABS
10.0000 mg | ORAL_TABLET | Freq: Every day | ORAL | Status: DC
  Administered 2024-02-10: 10:00:00 10 mg via ORAL
  Filled 2024-02-09: qty 1

## 2024-02-09 MED ORDER — PERFLUTREN LIPID MICROSPHERE
1.0000 mL | INTRAVENOUS | Status: AC | PRN
  Administered 2024-02-09: 10:00:00 3 mL via INTRAVENOUS

## 2024-02-09 MED ORDER — ENSURE PLUS HIGH PROTEIN PO LIQD
237.0000 mL | Freq: Two times a day (BID) | ORAL | Status: DC
  Administered 2024-02-10: 10:00:00 237 mL via ORAL

## 2024-02-09 NOTE — Progress Notes (Signed)
 Progress Note   Patient: Sheri Villarreal FMW:[id number] DOB: [birth year] DOA: 02/08/2024     0 DOS: the patient was seen and examined on 02/09/2024   Brief hospital course: 63 y.o. female with medical history significant of anxiety, hypertension, hyperlipidemia, chronic diastolic congestive heart failure, hypothyroidism, GERD, peripheral neuropathy presented to ED with a complaint of chest pain.  According to patient, she started having chest pain around noon while she was just trying to lay on her bed.  This wound was associated with dizziness, shortness of breath and some nausea but no syncope or passing out or any palpitations.  After 10 minutes, she called her neighbor who came to visit her.  She waited some more time but since the pain and other symptoms continued, she came to the emergency department.  Unfortunately, she had to wait almost 10 hours in the triage area before being seen by the ED physician.  Her symptoms lasted for about 2 hours or so and had already resolved by the time she was seen by the EDP.  She denies any fever, chills, sweating, palpitation, any problem with urination or with bowel movement.  When I saw her, her blood pressure was 224 systolic on the monitor.  I was able to find out from her that she has not been taking any of her medications for about a year due to not having insurance.  She tells me that she finally qualified for Medicare just 4 days ago when she turned 63 year old.  She has not been checking her blood pressure either.   ED Course: Upon arrival to ED, significantly hypertensive with a blood pressure of 166/116 but otherwise hemodynamically stable.  She is was afebrile.  CBC unremarkable, mild hypokalemia 3.2 on BMP with mild AKI.  TSH significantly elevated and T4 very low.  Tested negative for influenza, RSV and COVID.  Patient was chest pain-free when evaluated by ED physician therefore was not given any nitroglycerin .  She received full dose of  aspirin .  Assessment and Plan: Chest pain: Lasted for about 2 to 3 hours, she was chest pain-free when evaluated by ED physician.  EKG with T wave abnormality.  Troponin/delta positive 76> 100.   -Cardiology following. Recs to better manage below hypertensive urgency -No plans or ischemic work up noted at this time   History of hypertension presented with hypertensive urgency:  -Cardiology recs to start losartan  50mg  every day and increased norvasc  to 10mg  qd   Chronic diastolic congestive heart failure: Last echo was done in 2019 which showed grade 1 diastolic dysfunction.  Chest x-ray shows cardiomegaly with possible interstitial edema -repeat 2d echo reviewed. Normal LVEF   Hypokalemia:  -replaced   AKI vs CKD stage IIIa: Baseline creatinine between 0.6-0.8 based on the labs available a year ago -recheck bmet in AM   Acquired hypothyroidism:  -TSH 60.9 free T4<0.25.   -levothyroxine  increased to 100mcg -recommend rechecking TSH in 4-6 weeks   GERD: Resume PPI.   Hyperlipidemia: Resumed Zetia .   -LDL 143      Subjective: Without complaints this AM. Denied abd pain  Physical Exam: Vitals:   02/09/24 1030 02/09/24 1100 02/09/24 1151 02/09/24 1408  BP: (!) 185/103 (!) 175/101  (!) 154/96  Pulse: 72 64  72  Resp: 10 14  13   Temp:   98.2 F (36.8 C)   TempSrc:   Oral   SpO2: 97% 97%  100%  Weight:      Height:  General exam: Awake, laying in bed, in nad Respiratory system: Normal respiratory effort, no wheezing Cardiovascular system: regular rate, s1, s2 Gastrointestinal system: Soft, nondistended, positive BS Central nervous system: CN2-12 grossly intact, strength intact Extremities: Perfused, no clubbing Skin: Normal skin turgor, no notable skin lesions seen Psychiatry: Mood normal // no visual hallucinations   Data Reviewed:  There are no new results to review at this time.  Family Communication: Pt in room, family not at bedside  Disposition: Status  is: Observation The patient will require care spanning > 2 midnights and should be moved to inpatient because: severity of illness  Planned Discharge Destination: Home     Author: Garnette Pelt, MD 02/09/2024 3:37 PM  For on call review [email or web address].

## 2024-02-09 NOTE — Progress Notes (Signed)
 Echocardiogram 2D Echocardiogram has been performed.  Sheri Villarreal 02/09/2024, 9:58 AM

## 2024-02-09 NOTE — Progress Notes (Signed)
 Rounding Note   Patient Name: Sheri Villarreal Date of Encounter: 02/09/2024  [facility] Cardiologist: None   Subjective - Patient consulted overnight by the overnight cardiology fellow for the evaluation of chest pain. - The patient unfortunately has been nonadherent to her medications and came in with severely elevated blood pressures. - Her symptoms of chest tightness were associated with dizziness and anxiety which I think were likely a manifestation of her profound hypothyroidism and an episode of anxiety. - Troponins are flat and no ongoing chest pain to suggest ischemia.  Scheduled Meds:  [START ON 02/10/2024] amLODipine   10 mg Oral Daily   aspirin  EC  81 mg Oral Daily   ezetimibe   10 mg Oral Daily   gabapentin   200 mg Oral QHS   hydrALAZINE   25 mg Oral TID   levothyroxine   100 mcg Oral Q0600   losartan   50 mg Oral Daily   pantoprazole   40 mg Oral QAC breakfast   Continuous Infusions:  PRN Meds: acetaminophen , cyclobenzaprine , hydrALAZINE , ondansetron  (ZOFRAN ) IV   Vital Signs  Vitals:   02/09/24 0834 02/09/24 1030 02/09/24 1100 02/09/24 1151  BP: (!) 195/100 (!) 185/103 (!) 175/101   Pulse: 68 72 64   Resp: (!) 23 10 14    Temp:    98.2 F (36.8 C)  TempSrc:    Oral  SpO2: 98% 97% 97%   Weight:      Height:       No intake or output data in the 24 hours ending 02/09/24 1328    02/08/2024   11:14 PM 11/03/2022    2:39 PM 09/22/2022    9:27 AM  Last 3 Weights  Weight (lbs) 175 lb 173 lb 174 lb  Weight (kg) 79.379 kg 78.472 kg 78.926 kg      Telemetry NSR- Personally Reviewed  ECG  NSR- Personally Reviewed  Physical Exam  GEN: No acute distress, bilateral exophthalmos Neck: No JVD Cardiac: RRR, II/VI systolic murmur heard at the LUSB, no rubs or gallops.  Respiratory: Clear to auscultation bilaterally. GI: Soft, nontender, non-distended  MS: No edema; No deformity. Neuro:  Nonfocal  Psych: Normal affect   Labs High Sensitivity  Troponin:  No results for input(s): TROPONINIHS in the last 720 hours.  Recent Labs  Lab 02/08/24 1601 02/08/24 2041 02/08/24 2327 02/09/24 0018 02/09/24 0130  TRNPT 76* 100* 97* 97* 78*       Chemistry Recent Labs  Lab 02/08/24 1601  NA 137  K 3.2*  CL 101  CO2 25  GLUCOSE 98  BUN 12  CREATININE 1.27*  CALCIUM  9.0  GFRNONAA 47*  ANIONGAP 12    Lipids  Recent Labs  Lab 02/09/24 0542  CHOL 235*  TRIG 162*  HDL 59  LDLCALC 143*  CHOLHDL 4.0    Hematology Recent Labs  Lab 02/08/24 1601  WBC 7.5  RBC 3.96  HGB 13.4  HCT 39.5  MCV 99.7  MCH 33.8  MCHC 33.9  RDW 13.4  PLT 293   Thyroid   Recent Labs  Lab 02/08/24 1606 02/08/24 2039  TSH 60.900*  --   FREET4  --  <0.25*    BNP Recent Labs  Lab 02/09/24 0018  PROBNP 3,673.0*    DDimer No results for input(s): DDIMER in the last 168 hours.   Radiology  ECHOCARDIOGRAM COMPLETE Result Date: 02/09/2024    ECHOCARDIOGRAM REPORT   Patient Name:   Sheri Villarreal Date of Exam: 02/09/2024 Medical Rec #:  [id number]  Height:       63.0 in Accession #:    [id number]          Weight:       175.0 lb Date of Birth:  05/13/1960          BSA:          1.827 m Patient Age:    63 years            BP:           195/100 mmHg Patient Gender: F                   HR:           60 bpm. Exam Location:  Inpatient Procedure: 2D Echo, Cardiac Doppler, Color Doppler and Intracardiac            Opacification Agent (Both Spectral and Color Flow Doppler were            utilized during procedure). Indications:    Chest Pain  History:        Patient has prior history of Echocardiogram examinations, most                 recent 01/25/2018. Signs/Symptoms:Chest Pain, Fatigue and                 Shortness of Breath; Risk Factors:Hypertension and Current                 Smoker.  Sonographer:    Juliene Rucks Referring Phys: [id number] RAVI PAHWANI  Sonographer Comments: Patient is obese. Image acquisition challenging due to  respiratory motion. IMPRESSIONS  1. Left ventricular ejection fraction, by estimation, is 65 to 70%. The left ventricle has normal function. The left ventricle has no regional wall motion abnormalities. There is mild asymmetric left ventricular hypertrophy of the basal-septal segment. Left ventricular diastolic parameters are consistent with Grade I diastolic dysfunction (impaired relaxation).  2. Right ventricular systolic function is normal. The right ventricular size is normal.  3. A small pericardial effusion is present. The pericardial effusion is surrounding the apex and localized near the right atrium. There is no evidence of cardiac tamponade.  4. The mitral valve is normal in structure. No evidence of mitral valve regurgitation. No evidence of mitral stenosis.  5. The aortic valve is normal in structure. There is mild calcification of the aortic valve. Aortic valve regurgitation is not visualized. Aortic valve sclerosis/calcification is present, without any evidence of aortic stenosis.  6. The inferior vena cava is dilated in size with >50% respiratory variability, suggesting right atrial pressure of 8 mmHg. Conclusion(s)/Recommendation(s): Technically limited study due to poor sound wave transmission. FINDINGS  Left Ventricle: Left ventricular ejection fraction, by estimation, is 65 to 70%. The left ventricle has normal function. The left ventricle has no regional wall motion abnormalities. Definity  contrast agent was given IV to delineate the left ventricular  endocardial borders. The left ventricular internal cavity size was normal in size. There is mild asymmetric left ventricular hypertrophy of the basal-septal segment. Left ventricular diastolic parameters are consistent with Grade I diastolic dysfunction  (impaired relaxation). Right Ventricle: The right ventricular size is normal. No increase in right ventricular wall thickness. Right ventricular systolic function is normal. Left Atrium: Left atrial  size was normal in size. Right Atrium: Right atrial size was normal in size. Pericardium: A small pericardial effusion is present. The pericardial effusion is surrounding the apex and localized near the right atrium. There  is no evidence of cardiac tamponade. Mitral Valve: The mitral valve is normal in structure. No evidence of mitral valve regurgitation. No evidence of mitral valve stenosis. Tricuspid Valve: The tricuspid valve is normal in structure. Tricuspid valve regurgitation is trivial. No evidence of tricuspid stenosis. Aortic Valve: The aortic valve is normal in structure. There is mild calcification of the aortic valve. Aortic valve regurgitation is not visualized. Aortic valve sclerosis/calcification is present, without any evidence of aortic stenosis. Pulmonic Valve: The pulmonic valve was normal in structure. Pulmonic valve regurgitation is not visualized. No evidence of pulmonic stenosis. Aorta: The aortic root is normal in size and structure. Venous: The inferior vena cava is dilated in size with greater than 50% respiratory variability, suggesting right atrial pressure of 8 mmHg. IAS/Shunts: No atrial level shunt detected by color flow Doppler.  LEFT VENTRICLE PLAX 2D LVIDd:         5.40 cm      Diastology LVIDs:         3.70 cm      LV e' medial:    2.63 cm/s LV PW:         0.90 cm      LV E/e' medial:  18.9 LV IVS:        1.30 cm      LV e' lateral:   3.50 cm/s LVOT diam:     1.60 cm      LV E/e' lateral: 14.2 LV SV:         41 LV SV Index:   22 LVOT Area:     2.01 cm  LV Volumes (MOD) LV vol d, MOD A2C: 150.0 ml LV vol d, MOD A4C: 138.0 ml LV vol s, MOD A2C: 90.3 ml LV vol s, MOD A4C: 85.3 ml LV SV MOD A2C:     59.7 ml LV SV MOD A4C:     138.0 ml LV SV MOD BP:      56.5 ml RIGHT VENTRICLE             IVC RV Basal diam:  3.20 cm     IVC diam: 2.10 cm RV Mid diam:    3.10 cm RV S prime:     14.00 cm/s TAPSE (M-mode): 1.3 cm LEFT ATRIUM           Index        RIGHT ATRIUM          Index LA diam:       2.40 cm 1.31 cm/m   RA Area:     9.21 cm LA Vol (A4C): 58.6 ml 32.08 ml/m  RA Volume:   20.70 ml 11.33 ml/m  AORTIC VALVE LVOT Vmax:   102.00 cm/s LVOT Vmean:  66.000 cm/s LVOT VTI:    0.203 m  AORTA Ao Root diam: 3.10 cm MITRAL VALVE MV Area (PHT): 3.17 cm    SHUNTS MV Decel Time: 239 msec    Systemic VTI:  0.20 m MV E velocity: 49.70 cm/s  Systemic Diam: 1.60 cm MV A velocity: 73.70 cm/s MV E/A ratio:  0.67 Toribio Fuel MD Electronically signed by Toribio Fuel MD Signature Date/Time: 02/09/2024/12:53:15 PM    Final    DG Chest 2 View Result Date: 02/08/2024 EXAM: 2 VIEW(S) XRAY OF THE CHEST 02/08/2024 04:25:00 PM COMPARISON: 10/31/2022 CLINICAL HISTORY: chest tightness and shortness of breath FINDINGS: LUNGS AND PLEURA: Bilateral interstitial prominence. No focal pulmonary opacity. No pleural effusion. No pneumothorax. HEART AND MEDIASTINUM: Aortic atherosclerosis. Cardiomegaly. BONES  AND SOFT TISSUES: Mild thoracic levoscoliosis. No acute osseous abnormality. IMPRESSION: 1. Cardiomegaly. 2. Bilateral interstitial prominence may represent mild interstitial edema . 3. Aortic atherosclerosis. Electronically signed by: Greig Pique MD 02/08/2024 04:29 PM EST RP Workstation: [id number]    Cardiac Studies   TTE 02/09/24:  IMPRESSIONS     1. Left ventricular ejection fraction, by estimation, is 65 to 70%. The  left ventricle has normal function. The left ventricle has no regional  wall motion abnormalities. There is mild asymmetric left ventricular  hypertrophy of the basal-septal segment.  Left ventricular diastolic parameters are consistent with Grade I  diastolic dysfunction (impaired relaxation).   2. Right ventricular systolic function is normal. The right ventricular  size is normal.   3. A small pericardial effusion is present. The pericardial effusion is  surrounding the apex and localized near the right atrium. There is no  evidence of cardiac tamponade.   4. The mitral  valve is normal in structure. No evidence of mitral valve  regurgitation. No evidence of mitral stenosis.   5. The aortic valve is normal in structure. There is mild calcification  of the aortic valve. Aortic valve regurgitation is not visualized. Aortic  valve sclerosis/calcification is present, without any evidence of aortic  stenosis.   6. The inferior vena cava is dilated in size with >50% respiratory  variability, suggesting right atrial pressure of 8 mmHg.    Patient Profile   Sheri Villarreal is a 63 y.o. female with a PMH of Graves' disease s/p ablation now with hypothyroidism, HTN and obesity who presented with hypertensive urgency and found to have severe hypothyroidism.  Cardiology is consulted for the evaluation of chest pain.  Assessment & Plan   #Non-Cardiac Chest Pain :: Patient presented with a fleeting episode of chest tightness and found to be markedly hypertensive.  Chest pain was not characteristic of cardiac chest pain and it was transient resolving without intervention.  Troponins were mildly elevated but flat.  Her presentation is not consistent with ACS and likely reflective of anxiety which she endorsed. Echocardiogram demonstrates no WMA's No additional ischemic evaluation needed at this time  #Hypertensive Urgency :: Blood pressure markedly elevated in the setting of medication nonadherence.  No signs of endorgan damage at this time.  Ultimately I think she will need higher dose of amlodipine  as well as the initiation of an ARB.  Will arrange cardiology follow-up with her as well for ongoing hypertension management. Start losartan  50 mg daily Increase amlodipine  to 10 mg daily  #Severe Hypothyroidism :: Patient has not been taking her Synthroid  and her TSH is markedly elevated. Management per primary     For questions or updates, please contact  [facility] Please consult [email or web address] for contact info under       Signed, Georganna Archer, MD   02/09/2024, 1:28 PM

## 2024-02-09 NOTE — Plan of Care (Signed)

## 2024-02-09 NOTE — Hospital Course (Signed)
 63 y.o. female with medical history significant of anxiety, hypertension, hyperlipidemia, chronic diastolic congestive heart failure, hypothyroidism, GERD, peripheral neuropathy presented to ED with a complaint of chest pain.  According to patient, she started having chest pain around noon while she was just trying to lay on her bed.  This wound was associated with dizziness, shortness of breath and some nausea but no syncope or passing out or any palpitations.  After 10 minutes, she called her neighbor who came to visit her.  She waited some more time but since the pain and other symptoms continued, she came to the emergency department.  Unfortunately, she had to wait almost 10 hours in the triage area before being seen by the ED physician.  Her symptoms lasted for about 2 hours or so and had already resolved by the time she was seen by the EDP.  She denies any fever, chills, sweating, palpitation, any problem with urination or with bowel movement.  When I saw her, her blood pressure was 224 systolic on the monitor.  I was able to find out from her that she has not been taking any of her medications for about a year due to not having insurance.  She tells me that she finally qualified for Medicare just 4 days ago when she turned 63 year old.  She has not been checking her blood pressure either.   ED Course: Upon arrival to ED, significantly hypertensive with a blood pressure of 166/116 but otherwise hemodynamically stable.  She is was afebrile.  CBC unremarkable, mild hypokalemia 3.2 on BMP with mild AKI.  TSH significantly elevated and T4 very low.  Tested negative for influenza, RSV and COVID.  Patient was chest pain-free when evaluated by ED physician therefore was not given any nitroglycerin .  She received full dose of aspirin .

## 2024-02-09 NOTE — Plan of Care (Signed)
   Problem: Education: Goal: Understanding of cardiac disease, CV risk reduction, and recovery process will improve Outcome: Progressing   Problem: Activity: Goal: Ability to tolerate increased activity will improve Outcome: Progressing   Problem: Cardiac: Goal: Ability to achieve and maintain adequate cardiovascular perfusion will improve Outcome: Progressing   Problem: Health Behavior/Discharge Planning: Goal: Ability to safely manage health-related needs after discharge will improve Outcome: Progressing   Problem: Education: Goal: Knowledge of General Education information will improve Description Including pain rating scale, medication(s)/side effects and non-pharmacologic comfort measures Outcome: Progressing   Problem: Health Behavior/Discharge Planning: Goal: Ability to manage health-related needs will improve Outcome: Progressing

## 2024-02-10 ENCOUNTER — Other Ambulatory Visit (HOSPITAL_COMMUNITY): Payer: Self-pay

## 2024-02-10 LAB — COMPREHENSIVE METABOLIC PANEL WITH GFR
ALT: 8 U/L (ref 0–44)
AST: 18 U/L (ref 15–41)
Albumin: 3.7 g/dL (ref 3.5–5.0)
Alkaline Phosphatase: 68 U/L (ref 38–126)
Anion gap: 8 (ref 5–15)
BUN: 17 mg/dL (ref 8–23)
CO2: 25 mmol/L (ref 22–32)
Calcium: 8.6 mg/dL — ABNORMAL LOW (ref 8.9–10.3)
Chloride: 103 mmol/L (ref 98–111)
Creatinine, Ser: 1.45 mg/dL — ABNORMAL HIGH (ref 0.44–1.00)
GFR, Estimated: 40 mL/min — ABNORMAL LOW (ref >60)
Glucose, Bld: 98 mg/dL (ref 70–99)
Potassium: 3.5 mmol/L (ref 3.5–5.1)
Sodium: 136 mmol/L (ref 135–145)
Total Bilirubin: 0.4 mg/dL (ref 0.0–1.2)
Total Protein: 6 g/dL — ABNORMAL LOW (ref 6.5–8.1)

## 2024-02-10 LAB — CBC
HCT: 36.2 % (ref 36.0–46.0)
Hemoglobin: 12.5 g/dL (ref 12.0–15.0)
MCH: 34.2 pg — ABNORMAL HIGH (ref 26.0–34.0)
MCHC: 34.5 g/dL (ref 30.0–36.0)
MCV: 98.9 fL (ref 80.0–100.0)
Platelets: 260 K/uL (ref 150–400)
RBC: 3.66 MIL/uL — ABNORMAL LOW (ref 3.87–5.11)
RDW: 13.4 % (ref 11.5–15.5)
WBC: 6.3 K/uL (ref 4.0–10.5)
nRBC: 0 % (ref 0.0–0.2)

## 2024-02-10 LAB — T3, FREE: T3, Free: 0.4 pg/mL — ABNORMAL LOW (ref 2.0–4.4)

## 2024-02-10 MED ORDER — HYDRALAZINE HCL 25 MG PO TABS
25.0000 mg | ORAL_TABLET | Freq: Three times a day (TID) | ORAL | 0 refills | Status: AC
  Filled 2024-02-10: qty 90, 30d supply, fill #0

## 2024-02-10 MED ORDER — INFLUENZA VAC SPLIT HIGH-DOSE 0.5 ML IM SUSY: 0.5000 mL | PREFILLED_SYRINGE | Freq: Once | INTRAMUSCULAR | 0 refills | Status: AC

## 2024-02-10 MED ORDER — EZETIMIBE 10 MG PO TABS
10.0000 mg | ORAL_TABLET | Freq: Every day | ORAL | 0 refills | Status: AC
  Filled 2024-02-10: qty 30, 30d supply, fill #0

## 2024-02-10 MED ORDER — AMLODIPINE BESYLATE 10 MG PO TABS
10.0000 mg | ORAL_TABLET | Freq: Every day | ORAL | 0 refills | Status: AC
  Filled 2024-02-10: qty 30, 30d supply, fill #0

## 2024-02-10 MED ORDER — INFLUENZA VAC SPLIT HIGH-DOSE 0.5 ML IM SUSY: 0.5000 mL | PREFILLED_SYRINGE | INTRAMUSCULAR | Status: DC

## 2024-02-10 MED ORDER — LOSARTAN POTASSIUM 50 MG PO TABS
50.0000 mg | ORAL_TABLET | Freq: Every day | ORAL | 0 refills | Status: AC
  Filled 2024-02-10: qty 30, 30d supply, fill #0

## 2024-02-10 MED ORDER — FLUOXETINE HCL 10 MG PO CAPS
10.0000 mg | ORAL_CAPSULE | Freq: Every day | ORAL | 0 refills | Status: AC
  Filled 2024-02-10: qty 30, 30d supply, fill #0

## 2024-02-10 MED ORDER — PANTOPRAZOLE SODIUM 40 MG PO TBEC
40.0000 mg | DELAYED_RELEASE_TABLET | Freq: Every day | ORAL | 0 refills | Status: AC
  Filled 2024-02-10: qty 30, 30d supply, fill #0

## 2024-02-10 MED ORDER — INFLUENZA VAC SPLIT HIGH-DOSE 0.5 ML IM SUSY
0.5000 mL | PREFILLED_SYRINGE | Freq: Once | INTRAMUSCULAR | Status: AC
  Administered 2024-02-10: 12:00:00 0.5 mL via INTRAMUSCULAR
  Filled 2024-02-10: qty 0.5

## 2024-02-10 MED ORDER — LEVOTHYROXINE SODIUM 100 MCG PO TABS
100.0000 ug | ORAL_TABLET | Freq: Every day | ORAL | 0 refills | Status: AC
  Filled 2024-02-10: qty 30, 30d supply, fill #0

## 2024-02-10 NOTE — Progress Notes (Signed)
 Rounding Note   Patient Name: Sheri Villarreal Date of Encounter: 02/10/2024  [facility] Cardiologist: None   Subjective - No acute events overnight - No complaints  Scheduled Meds:  amLODipine   10 mg Oral Daily   aspirin  EC  81 mg Oral Daily   ezetimibe   10 mg Oral Daily   feeding supplement  237 mL Oral BID BM   gabapentin   200 mg Oral QHS   hydrALAZINE   25 mg Oral TID   [START ON 02/11/2024] Influenza vac split trivalent PF  0.5 mL Intramuscular Tomorrow-1000   levothyroxine   100 mcg Oral Q0600   losartan   50 mg Oral Daily   pantoprazole   40 mg Oral QAC breakfast   pneumococcal 20-valent conjugate vaccine  0.5 mL Intramuscular Tomorrow-1000   Continuous Infusions:  PRN Meds: acetaminophen , cyclobenzaprine , hydrALAZINE , ondansetron  (ZOFRAN ) IV   Vital Signs  Vitals:   02/09/24 1959 02/09/24 2326 02/10/24 0419 02/10/24 0751  BP: (!) 173/93 (!) 144/76 (!) 152/94 (!) 151/75  Pulse: 70 66 (!) 59 68  Resp: 18 15 18    Temp: 97.6 F (36.4 C) 98.1 F (36.7 C) 97.8 F (36.6 C) 97.8 F (36.6 C)  TempSrc: Oral Oral Oral Oral  SpO2: 91% 97% 97% 96%  Weight:      Height:        Intake/Output Summary (Last 24 hours) at 02/10/2024 1039 Last data filed at 02/09/2024 2100 Gross per 24 hour  Intake 240 ml  Output --  Net 240 ml      02/09/2024    4:54 PM 02/08/2024   11:14 PM 11/03/2022    2:39 PM  Last 3 Weights  Weight (lbs) 172 lb 6.4 oz 175 lb 173 lb  Weight (kg) 78.2 kg 79.379 kg 78.472 kg      Telemetry Sinus bradycardia- Personally Reviewed  ECG  Sinus bradycardia- Personally Reviewed  Physical Exam  GEN: No acute distress, bilateral exophthalmos Neck: No JVD Cardiac: RRR, II/VI systolic murmur heard at the LUSB, no rubs or gallops.  Respiratory: Clear to auscultation bilaterally. GI: Soft, nontender, non-distended  MS: No edema; No deformity. Neuro:  Nonfocal  Psych: Normal affect   Labs High Sensitivity Troponin:  No results  for input(s): TROPONINIHS in the last 720 hours.  Recent Labs  Lab 02/08/24 1601 02/08/24 2041 02/08/24 2327 02/09/24 0018 02/09/24 0130  TRNPT 76* 100* 97* 97* 78*       Chemistry Recent Labs  Lab 02/08/24 1601 02/10/24 0236  NA 137 136  K 3.2* 3.5  CL 101 103  CO2 25 25  GLUCOSE 98 98  BUN 12 17  CREATININE 1.27* 1.45*  CALCIUM  9.0 8.6*  PROT  --  6.0*  ALBUMIN  --  3.7  AST  --  18  ALT  --  8  ALKPHOS  --  68  BILITOT  --  0.4  GFRNONAA 47* 40*  ANIONGAP 12 8    Lipids  Recent Labs  Lab 02/09/24 0542  CHOL 235*  TRIG 162*  HDL 59  LDLCALC 143*  CHOLHDL 4.0    Hematology Recent Labs  Lab 02/08/24 1601 02/10/24 0236  WBC 7.5 6.3  RBC 3.96 3.66*  HGB 13.4 12.5  HCT 39.5 36.2  MCV 99.7 98.9  MCH 33.8 34.2*  MCHC 33.9 34.5  RDW 13.4 13.4  PLT 293 260   Thyroid   Recent Labs  Lab 02/08/24 1606 02/08/24 2039  TSH 60.900*  --   FREET4  --  <  0.25*    BNP Recent Labs  Lab 02/09/24 0018  PROBNP 3,673.0*    DDimer No results for input(s): DDIMER in the last 168 hours.   Radiology  ECHOCARDIOGRAM COMPLETE Result Date: 02/09/2024    ECHOCARDIOGRAM REPORT   Patient Name:   Sheri Villarreal Date of Exam: 02/09/2024 Medical Rec #:  [id number]           Height:       63.0 in Accession #:    [id number]          Weight:       175.0 lb Date of Birth:  02/14/1961          BSA:          1.827 m Patient Age:    63 years            BP:           195/100 mmHg Patient Gender: F                   HR:           60 bpm. Exam Location:  Inpatient Procedure: 2D Echo, Cardiac Doppler, Color Doppler and Intracardiac            Opacification Agent (Both Spectral and Color Flow Doppler were            utilized during procedure). Indications:    Chest Pain  History:        Patient has prior history of Echocardiogram examinations, most                 recent 01/25/2018. Signs/Symptoms:Chest Pain, Fatigue and                 Shortness of Breath; Risk Factors:Hypertension  and Current                 Smoker.  Sonographer:    Juliene Rucks Referring Phys: [id number] RAVI PAHWANI  Sonographer Comments: Patient is obese. Image acquisition challenging due to respiratory motion. IMPRESSIONS  1. Left ventricular ejection fraction, by estimation, is 65 to 70%. The left ventricle has normal function. The left ventricle has no regional wall motion abnormalities. There is mild asymmetric left ventricular hypertrophy of the basal-septal segment. Left ventricular diastolic parameters are consistent with Grade I diastolic dysfunction (impaired relaxation).  2. Right ventricular systolic function is normal. The right ventricular size is normal.  3. A small pericardial effusion is present. The pericardial effusion is surrounding the apex and localized near the right atrium. There is no evidence of cardiac tamponade.  4. The mitral valve is normal in structure. No evidence of mitral valve regurgitation. No evidence of mitral stenosis.  5. The aortic valve is normal in structure. There is mild calcification of the aortic valve. Aortic valve regurgitation is not visualized. Aortic valve sclerosis/calcification is present, without any evidence of aortic stenosis.  6. The inferior vena cava is dilated in size with >50% respiratory variability, suggesting right atrial pressure of 8 mmHg. Conclusion(s)/Recommendation(s): Technically limited study due to poor sound wave transmission. FINDINGS  Left Ventricle: Left ventricular ejection fraction, by estimation, is 65 to 70%. The left ventricle has normal function. The left ventricle has no regional wall motion abnormalities. Definity  contrast agent was given IV to delineate the left ventricular  endocardial borders. The left ventricular internal cavity size was normal in size. There is mild asymmetric left ventricular hypertrophy of the basal-septal segment. Left ventricular diastolic parameters are  consistent with Grade I diastolic dysfunction  (impaired  relaxation). Right Ventricle: The right ventricular size is normal. No increase in right ventricular wall thickness. Right ventricular systolic function is normal. Left Atrium: Left atrial size was normal in size. Right Atrium: Right atrial size was normal in size. Pericardium: A small pericardial effusion is present. The pericardial effusion is surrounding the apex and localized near the right atrium. There is no evidence of cardiac tamponade. Mitral Valve: The mitral valve is normal in structure. No evidence of mitral valve regurgitation. No evidence of mitral valve stenosis. Tricuspid Valve: The tricuspid valve is normal in structure. Tricuspid valve regurgitation is trivial. No evidence of tricuspid stenosis. Aortic Valve: The aortic valve is normal in structure. There is mild calcification of the aortic valve. Aortic valve regurgitation is not visualized. Aortic valve sclerosis/calcification is present, without any evidence of aortic stenosis. Pulmonic Valve: The pulmonic valve was normal in structure. Pulmonic valve regurgitation is not visualized. No evidence of pulmonic stenosis. Aorta: The aortic root is normal in size and structure. Venous: The inferior vena cava is dilated in size with greater than 50% respiratory variability, suggesting right atrial pressure of 8 mmHg. IAS/Shunts: No atrial level shunt detected by color flow Doppler.  LEFT VENTRICLE PLAX 2D LVIDd:         5.40 cm      Diastology LVIDs:         3.70 cm      LV e' medial:    2.63 cm/s LV PW:         0.90 cm      LV E/e' medial:  18.9 LV IVS:        1.30 cm      LV e' lateral:   3.50 cm/s LVOT diam:     1.60 cm      LV E/e' lateral: 14.2 LV SV:         41 LV SV Index:   22 LVOT Area:     2.01 cm  LV Volumes (MOD) LV vol d, MOD A2C: 150.0 ml LV vol d, MOD A4C: 138.0 ml LV vol s, MOD A2C: 90.3 ml LV vol s, MOD A4C: 85.3 ml LV SV MOD A2C:     59.7 ml LV SV MOD A4C:     138.0 ml LV SV MOD BP:      56.5 ml RIGHT VENTRICLE             IVC RV  Basal diam:  3.20 cm     IVC diam: 2.10 cm RV Mid diam:    3.10 cm RV S prime:     14.00 cm/s TAPSE (M-mode): 1.3 cm LEFT ATRIUM           Index        RIGHT ATRIUM          Index LA diam:      2.40 cm 1.31 cm/m   RA Area:     9.21 cm LA Vol (A4C): 58.6 ml 32.08 ml/m  RA Volume:   20.70 ml 11.33 ml/m  AORTIC VALVE LVOT Vmax:   102.00 cm/s LVOT Vmean:  66.000 cm/s LVOT VTI:    0.203 m  AORTA Ao Root diam: 3.10 cm MITRAL VALVE MV Area (PHT): 3.17 cm    SHUNTS MV Decel Time: 239 msec    Systemic VTI:  0.20 m MV E velocity: 49.70 cm/s  Systemic Diam: 1.60 cm MV A velocity: 73.70 cm/s MV E/A ratio:  0.67  Toribio Fuel MD Electronically signed by Toribio Fuel MD Signature Date/Time: 02/09/2024/12:53:15 PM    Final    DG Chest 2 View Result Date: 02/08/2024 EXAM: 2 VIEW(S) XRAY OF THE CHEST 02/08/2024 04:25:00 PM COMPARISON: 10/31/2022 CLINICAL HISTORY: chest tightness and shortness of breath FINDINGS: LUNGS AND PLEURA: Bilateral interstitial prominence. No focal pulmonary opacity. No pleural effusion. No pneumothorax. HEART AND MEDIASTINUM: Aortic atherosclerosis. Cardiomegaly. BONES AND SOFT TISSUES: Mild thoracic levoscoliosis. No acute osseous abnormality. IMPRESSION: 1. Cardiomegaly. 2. Bilateral interstitial prominence may represent mild interstitial edema . 3. Aortic atherosclerosis. Electronically signed by: Greig Pique MD 02/08/2024 04:29 PM EST RP Workstation: [id number]    Cardiac Studies   TTE 02/09/24:  IMPRESSIONS     1. Left ventricular ejection fraction, by estimation, is 65 to 70%. The  left ventricle has normal function. The left ventricle has no regional  wall motion abnormalities. There is mild asymmetric left ventricular  hypertrophy of the basal-septal segment.  Left ventricular diastolic parameters are consistent with Grade I  diastolic dysfunction (impaired relaxation).   2. Right ventricular systolic function is normal. The right ventricular  size is normal.   3.  A small pericardial effusion is present. The pericardial effusion is  surrounding the apex and localized near the right atrium. There is no  evidence of cardiac tamponade.   4. The mitral valve is normal in structure. No evidence of mitral valve  regurgitation. No evidence of mitral stenosis.   5. The aortic valve is normal in structure. There is mild calcification  of the aortic valve. Aortic valve regurgitation is not visualized. Aortic  valve sclerosis/calcification is present, without any evidence of aortic  stenosis.   6. The inferior vena cava is dilated in size with >50% respiratory  variability, suggesting right atrial pressure of 8 mmHg.   Patient Profile   Ms. Carthen is a 63 y.o. female with a PMH of Graves' disease s/p ablation now with hypothyroidism, HTN and obesity who presented with hypertensive urgency and found to have severe hypothyroidism.  Cardiology is consulted for the evaluation of chest pain.   Assessment & Plan     #Non-Cardiac Chest Pain :: Patient presented with a fleeting episode of chest tightness and found to be markedly hypertensive.  Chest pain was not characteristic of cardiac chest pain and it was transient resolving without intervention.  Troponins were mildly elevated but flat.  Her presentation is not consistent with ACS and likely reflective of anxiety which she endorsed. Echocardiogram demonstrates no WMA's No additional ischemic evaluation needed at this time   #Hypertensive Urgency :: Blood pressure markedly elevated in the setting of medication nonadherence.  No signs of endorgan damage at this time.  Ultimately I think she will need higher dose of amlodipine  as well as the initiation of an ARB.  Will arrange cardiology follow-up with her as well for ongoing hypertension management. Continue losartan  50 mg daily Continue amlodipine  to 10 mg daily Continue hydralazine  25 mg 3 times daily   #Severe Hypothyroidism :: Patient has not been taking  her Synthroid  and her TSH is markedly elevated. Management per primary   [facility] will sign off.   The patient is ready for discharge today from a cardiac standpoint. Medication Recommendations:  START losartan  50 mg daily. START amlodipine  10 mg daily, START hydralazine  25 mg 3 times daily Other recommendations (labs, testing, etc): Outpatient BMET Follow up as an outpatient: Cardiology will arrange follow-up For questions or updates, please contact [facility]  Please consult [email or web address] for contact info under       Signed, Georganna Archer, MD  02/10/2024, 10:39 AM

## 2024-02-10 NOTE — Discharge Summary (Addendum)
 Physician Discharge Summary   Patient: Sheri Villarreal MRN: [id number] DOB: February 07, 1961  Admit date:     02/08/2024  Discharge date: 02/10/2024  Discharge Physician: Garnette Pelt   PCP: Pcp, No   Recommendations at discharge:    Follow up with PCP in 1-2 weeks Recommend recheck bmet in 1-2 weeks  Discharge Diagnoses: Principal Problem:   Chest pain  Resolved Problems:   * No resolved hospital problems. *  Hospital Course: 63 y.o. female with medical history significant of anxiety, hypertension, hyperlipidemia, chronic diastolic congestive heart failure, hypothyroidism, GERD, peripheral neuropathy presented to ED with a complaint of chest pain.  According to patient, she started having chest pain around noon while she was just trying to lay on her bed.  This wound was associated with dizziness, shortness of breath and some nausea but no syncope or passing out or any palpitations.  After 10 minutes, she called her neighbor who came to visit her.  She waited some more time but since the pain and other symptoms continued, she came to the emergency department.  Unfortunately, she had to wait almost 10 hours in the triage area before being seen by the ED physician.  Her symptoms lasted for about 2 hours or so and had already resolved by the time she was seen by the EDP.  She denies any fever, chills, sweating, palpitation, any problem with urination or with bowel movement.  When I saw her, her blood pressure was 224 systolic on the monitor.  I was able to find out from her that she has not been taking any of her medications for about a year due to not having insurance.  She tells me that she finally qualified for Medicare just 4 days ago when she turned 63 year old.  She has not been checking her blood pressure either.   ED Course: Upon arrival to ED, significantly hypertensive with a blood pressure of 166/116 but otherwise hemodynamically stable.  She is was afebrile.  CBC unremarkable, mild  hypokalemia 3.2 on BMP with mild AKI.  TSH significantly elevated and T4 very low.  Tested negative for influenza, RSV and COVID.  Patient was chest pain-free when evaluated by ED physician therefore was not given any nitroglycerin .  She received full dose of aspirin .  Assessment and Plan: Chest pain: Lasted for about 2 to 3 hours, she was chest pain-free when evaluated by ED physician.  EKG with T wave abnormality.  Troponin/delta positive 76> 100.   -Cardiology had been following. Recs to better manage below hypertensive urgency -No plans or ischemic work up noted at this time   History of hypertension presented with hypertensive urgency:  -Cardiology recs to continue losartan  50mg  every day with norvasc  increased to 10mg  every day, and continue hydralazine  25mg  TID   Chronic diastolic congestive heart failure: Last echo was done in 2019 which showed grade 1 diastolic dysfunction.  Chest x-ray shows cardiomegaly with possible interstitial edema -repeat 2d echo reviewed. Normal LVEF   Hypokalemia:  -replaced   AKI vs CKD stage IIIa: Baseline creatinine between 0.6-0.8 based on the labs available a year ago -renal function remained stable -Recommend recheck bmet in 1-2 weeks   Acquired hypothyroidism:  -TSH 60.9 free T4<0.25.   -levothyroxine  increased to 100mcg -recommend rechecking TSH in 4-6 weeks   GERD: Resume PPI.   Hyperlipidemia: Resumed Zetia .   -LDL 143   Anxiety -pt does report increased anxiety PTA that apparently became worse when I got older -Prescribed low dose SSRI, f/u  with PCP  Consultants: Cardiology Procedures performed:   Disposition: Home Diet recommendation:  Cardiac diet DISCHARGE MEDICATION: Allergies as of 02/10/2024       Reactions   Porcine (pork) Protein-containing Drug Products Other (See Comments)   Religious reasons   Shellfish Allergy Other (See Comments)   Religious reason   Lipitor [atorvastatin ] Other (See Comments)   Arm and leg  muscle pain   Robaxin  [methocarbamol ] Other (See Comments)   Developed sweating (forehead) shortly after taking; uncomfortable feeling   Hydrocodone  Bit-homatrop Mbr Itching, Other (See Comments)   Reaction to Hycodan cough syrup (Patient has taken Norco in the past with NO reaction)        Medication List     STOP taking these medications    gabapentin  100 MG capsule Commonly known as: NEURONTIN    Magnesium  500 MG Tabs   omeprazole  20 MG tablet Commonly known as: PRILOSEC  OTC   ondansetron  4 MG tablet Commonly known as: ZOFRAN    PriLOSEC  OTC 20 MG tablet Generic drug: omeprazole  Replaced by: pantoprazole  40 MG tablet   sodium chloride  1 g tablet       TAKE these medications    acetaminophen  500 MG tablet Commonly known as: TYLENOL  Take 1,000 mg by mouth every 6 (six) hours as needed for mild pain or headache.   amLODipine  10 MG tablet Commonly known as: NORVASC  Take 1 tablet (10 mg total) by mouth daily. Start taking on: February 11, 2024 What changed:  medication strength how much to take   aspirin  EC 81 MG tablet Take 81 mg by mouth at bedtime.   ezetimibe  10 MG tablet Commonly known as: Zetia  Take 1 tablet (10 mg total) by mouth daily.   FLUoxetine  10 MG capsule Commonly known as: PROZAC  Take 1 capsule (10 mg total) by mouth daily.   hydrALAZINE  25 MG tablet Commonly known as: APRESOLINE  Take 1 tablet (25 mg total) by mouth 3 (three) times daily.   Influenza vac split trivalent PF 0.5 ML injection Commonly known as: FLUZONE  HIGH-DOSE Inject 0.5 mLs into the muscle once for 1 dose.   levothyroxine  100 MCG tablet Commonly known as: SYNTHROID  Take 1 tablet (100 mcg total) by mouth daily at 6 (six) AM. Start taking on: February 11, 2024 What changed:  medication strength how much to take   losartan  50 MG tablet Commonly known as: COZAAR  Take 1 tablet (50 mg total) by mouth daily. Start taking on: February 11, 2024   pantoprazole  40 MG  tablet Commonly known as: PROTONIX  Take 1 tablet (40 mg total) by mouth daily before breakfast. Start taking on: February 11, 2024 Replaces: PriLOSEC  OTC 20 MG tablet        Follow-up Information     Follow up with your PCP as will be scheduled Follow up.   Why: Hospital follow up        Lennie Raguel MATSU, DO Follow up.   Specialty: Family Medicine Why: TIME :  9:30 AM   PLEASE ARRIVE AT 9:00 AM DATE : DECEMBER 29 , 2025 MONDAY  PLEASE  BRING ALL  CURRENT MEDICATION ,ID and INS CARD, CO-PAY Contact information: [address] [place] [address] [phone]                Discharge Exam: Sheri Villarreal   02/08/24 2314 02/09/24 1654  Weight: 79.4 kg 78.2 kg   General exam: Awake, laying in bed, in nad Respiratory system: Normal respiratory effort, no wheezing Cardiovascular system: regular rate, s1,  s2 Gastrointestinal system: Soft, nondistended, positive BS Central nervous system: CN2-12 grossly intact, strength intact Extremities: Perfused, no clubbing Skin: Normal skin turgor, no notable skin lesions seen Psychiatry: Mood normal // no visual hallucinations   Condition at discharge: fair  The results of significant diagnostics from this hospitalization (including imaging, microbiology, ancillary and laboratory) are listed below for reference.   Imaging Studies: ECHOCARDIOGRAM COMPLETE Result Date: 02/09/2024    ECHOCARDIOGRAM REPORT   Patient Name:   Sheri Villarreal Date of Exam: 02/09/2024 Medical Rec #:  [id number]           Height:       63.0 in Accession #:    [id number]          Weight:       175.0 lb Date of Birth:  Jul 03, 1960          BSA:          1.827 m Patient Age:    63 years            BP:           195/100 mmHg Patient Gender: F                   HR:           60 bpm. Exam Location:  Inpatient Procedure: 2D Echo, Cardiac Doppler, Color Doppler and Intracardiac            Opacification Agent (Both Spectral and Color Flow Doppler were             utilized during procedure). Indications:    Chest Pain  History:        Patient has prior history of Echocardiogram examinations, most                 recent 01/25/2018. Signs/Symptoms:Chest Pain, Fatigue and                 Shortness of Breath; Risk Factors:Hypertension and Current                 Smoker.  Sonographer:    Juliene Rucks Referring Phys: [id number] RAVI PAHWANI  Sonographer Comments: Patient is obese. Image acquisition challenging due to respiratory motion. IMPRESSIONS  1. Left ventricular ejection fraction, by estimation, is 65 to 70%. The left ventricle has normal function. The left ventricle has no regional wall motion abnormalities. There is mild asymmetric left ventricular hypertrophy of the basal-septal segment. Left ventricular diastolic parameters are consistent with Grade I diastolic dysfunction (impaired relaxation).  2. Right ventricular systolic function is normal. The right ventricular size is normal.  3. A small pericardial effusion is present. The pericardial effusion is surrounding the apex and localized near the right atrium. There is no evidence of cardiac tamponade.  4. The mitral valve is normal in structure. No evidence of mitral valve regurgitation. No evidence of mitral stenosis.  5. The aortic valve is normal in structure. There is mild calcification of the aortic valve. Aortic valve regurgitation is not visualized. Aortic valve sclerosis/calcification is present, without any evidence of aortic stenosis.  6. The inferior vena cava is dilated in size with >50% respiratory variability, suggesting right atrial pressure of 8 mmHg. Conclusion(s)/Recommendation(s): Technically limited study due to poor sound wave transmission. FINDINGS  Left Ventricle: Left ventricular ejection fraction, by estimation, is 65 to 70%. The left ventricle has normal function. The left ventricle has no regional wall motion abnormalities. Definity  contrast agent was given IV to  delineate the left ventricular   endocardial borders. The left ventricular internal cavity size was normal in size. There is mild asymmetric left ventricular hypertrophy of the basal-septal segment. Left ventricular diastolic parameters are consistent with Grade I diastolic dysfunction  (impaired relaxation). Right Ventricle: The right ventricular size is normal. No increase in right ventricular wall thickness. Right ventricular systolic function is normal. Left Atrium: Left atrial size was normal in size. Right Atrium: Right atrial size was normal in size. Pericardium: A small pericardial effusion is present. The pericardial effusion is surrounding the apex and localized near the right atrium. There is no evidence of cardiac tamponade. Mitral Valve: The mitral valve is normal in structure. No evidence of mitral valve regurgitation. No evidence of mitral valve stenosis. Tricuspid Valve: The tricuspid valve is normal in structure. Tricuspid valve regurgitation is trivial. No evidence of tricuspid stenosis. Aortic Valve: The aortic valve is normal in structure. There is mild calcification of the aortic valve. Aortic valve regurgitation is not visualized. Aortic valve sclerosis/calcification is present, without any evidence of aortic stenosis. Pulmonic Valve: The pulmonic valve was normal in structure. Pulmonic valve regurgitation is not visualized. No evidence of pulmonic stenosis. Aorta: The aortic root is normal in size and structure. Venous: The inferior vena cava is dilated in size with greater than 50% respiratory variability, suggesting right atrial pressure of 8 mmHg. IAS/Shunts: No atrial level shunt detected by color flow Doppler.  LEFT VENTRICLE PLAX 2D LVIDd:         5.40 cm      Diastology LVIDs:         3.70 cm      LV e' medial:    2.63 cm/s LV PW:         0.90 cm      LV E/e' medial:  18.9 LV IVS:        1.30 cm      LV e' lateral:   3.50 cm/s LVOT diam:     1.60 cm      LV E/e' lateral: 14.2 LV SV:         41 LV SV Index:   22 LVOT  Area:     2.01 cm  LV Volumes (MOD) LV vol d, MOD A2C: 150.0 ml LV vol d, MOD A4C: 138.0 ml LV vol s, MOD A2C: 90.3 ml LV vol s, MOD A4C: 85.3 ml LV SV MOD A2C:     59.7 ml LV SV MOD A4C:     138.0 ml LV SV MOD BP:      56.5 ml RIGHT VENTRICLE             IVC RV Basal diam:  3.20 cm     IVC diam: 2.10 cm RV Mid diam:    3.10 cm RV S prime:     14.00 cm/s TAPSE (M-mode): 1.3 cm LEFT ATRIUM           Index        RIGHT ATRIUM          Index LA diam:      2.40 cm 1.31 cm/m   RA Area:     9.21 cm LA Vol (A4C): 58.6 ml 32.08 ml/m  RA Volume:   20.70 ml 11.33 ml/m  AORTIC VALVE LVOT Vmax:   102.00 cm/s LVOT Vmean:  66.000 cm/s LVOT VTI:    0.203 m  AORTA Ao Root diam: 3.10 cm MITRAL VALVE MV Area (PHT): 3.17 cm    SHUNTS  MV Decel Time: 239 msec    Systemic VTI:  0.20 m MV E velocity: 49.70 cm/s  Systemic Diam: 1.60 cm MV A velocity: 73.70 cm/s MV E/A ratio:  0.67 Toribio Fuel MD Electronically signed by Toribio Fuel MD Signature Date/Time: 02/09/2024/12:53:15 PM    Final    DG Chest 2 View Result Date: 02/08/2024 EXAM: 2 VIEW(S) XRAY OF THE CHEST 02/08/2024 04:25:00 PM COMPARISON: 10/31/2022 CLINICAL HISTORY: chest tightness and shortness of breath FINDINGS: LUNGS AND PLEURA: Bilateral interstitial prominence. No focal pulmonary opacity. No pleural effusion. No pneumothorax. HEART AND MEDIASTINUM: Aortic atherosclerosis. Cardiomegaly. BONES AND SOFT TISSUES: Mild thoracic levoscoliosis. No acute osseous abnormality. IMPRESSION: 1. Cardiomegaly. 2. Bilateral interstitial prominence may represent mild interstitial edema . 3. Aortic atherosclerosis. Electronically signed by: Greig Pique MD 02/08/2024 04:29 PM EST RP Workstation: [id number]    Microbiology: Results for orders placed or performed during the hospital encounter of 02/08/24  Resp panel by RT-PCR (RSV, Flu A&B, Covid) Anterior Nasal Swab     Status: None   Collection Time: 02/08/24  4:05 PM   Specimen: Anterior Nasal Swab  Result Value  Ref Range Status   SARS Coronavirus 2 by RT PCR NEGATIVE NEGATIVE Final   Influenza A by PCR NEGATIVE NEGATIVE Final   Influenza B by PCR NEGATIVE NEGATIVE Final    Comment: (NOTE) The Xpert Xpress SARS-CoV-2/FLU/RSV plus assay is intended as an aid in the diagnosis of influenza from Nasopharyngeal swab specimens and should not be used as a sole basis for treatment. Nasal washings and aspirates are unacceptable for Xpert Xpress SARS-CoV-2/FLU/RSV testing.  Fact Sheet for Patients: bloggercourse.com  Fact Sheet for Healthcare Providers: seriousbroker.it  This test is not yet approved or cleared by the [place]  FDA and has been authorized for detection and/or diagnosis of SARS-CoV-2 by FDA under an Emergency Use Authorization (EUA). This EUA will remain in effect (meaning this test can be used) for the duration of the COVID-19 declaration under Section 564(b)(1) of the Act, 21 U.S.C. section 360bbb-3(b)(1), unless the authorization is terminated or revoked.     Resp Syncytial Virus by PCR NEGATIVE NEGATIVE Final    Comment: (NOTE) Fact Sheet for Patients: bloggercourse.com  Fact Sheet for Healthcare Providers: seriousbroker.it  This test is not yet approved or cleared by the [place]  FDA and has been authorized for detection and/or diagnosis of SARS-CoV-2 by FDA under an Emergency Use Authorization (EUA). This EUA will remain in effect (meaning this test can be used) for the duration of the COVID-19 declaration under Section 564(b)(1) of the Act, 21 U.S.C. section 360bbb-3(b)(1), unless the authorization is terminated or revoked.  Performed at [facility], [address] [place], [place] [address]   MRSA Next Gen by PCR, Nasal     Status: None   Collection Time: 02/09/24  5:24 PM   Specimen: Nasal Mucosa; Nasal Swab  Result Value Ref Range Status   MRSA by  PCR Next Gen NOT DETECTED NOT DETECTED Final    Comment: (NOTE) The GeneXpert MRSA Assay (FDA approved for NASAL specimens only), is one component of a comprehensive MRSA colonization surveillance program. It is not intended to diagnose MRSA infection nor to guide or monitor treatment for MRSA infections. Test performance is not FDA approved in patients less than 6 years old. Performed at [facility], [address] [place], [place] [address]     Labs: CBC: Recent Labs  Lab 02/08/24 1601 02/10/24 0236  WBC 7.5 6.3  HGB 13.4 12.5  HCT 39.5 36.2  MCV 99.7 98.9  PLT 293 260   Basic Metabolic Panel: Recent Labs  Lab 02/08/24 1601 02/10/24 0236  NA 137 136  K 3.2* 3.5  CL 101 103  CO2 25 25  GLUCOSE 98 98  BUN 12 17  CREATININE 1.27* 1.45*  CALCIUM  9.0 8.6*   Liver Function Tests: Recent Labs  Lab 02/10/24 0236  AST 18  ALT 8  ALKPHOS 68  BILITOT 0.4  PROT 6.0*  ALBUMIN 3.7   CBG: No results for input(s): GLUCAP in the last 168 hours.  Discharge time spent: less than 30 minutes.  Signed: Garnette Pelt, MD [facility] 02/10/2024

## 2024-02-10 NOTE — TOC Transition Note (Signed)
 Transition of Care [facility]) - Discharge Note   Patient Details  Name: Sheri Villarreal MRN: [id number] Date of Birth: 1960-10-23  Transition of Care [facility]) CM/SW Contact:  Roxie KANDICE Stain, RN Phone Number: 02/10/2024, 11:03 AM   Clinical Narrative:    Patient stable for discharge.  Patient agreeable for ICM to make PCP apt. Referral sen to CMA. No insurance on file. MATCH letter sent to [facility] pharmacy.  Friend can transport home.    Final next level of care: Home/Self Care Barriers to Discharge: Barriers Resolved   Patient Goals and CMS Choice Patient states their goals for this hospitalization and ongoing recovery are:: return home          Discharge Placement                     home  Discharge Plan and Services Additional resources added to the After Visit Summary for   In-house Referral: PCP / [facility] Connect Discharge Planning Services: CM Consult, Follow-up appt scheduled, MATCH Program                                 Social Drivers of Health (SDOH) Interventions SDOH Screenings   Food Insecurity: No Food Insecurity (02/09/2024)  Housing: Low Risk (02/09/2024)  Transportation Needs: No Transportation Needs (02/09/2024)  Utilities: Not At Risk (02/09/2024)  Depression (PHQ2-9): Low Risk (09/22/2022)  Social Connections: Socially Isolated (02/09/2024)  Tobacco Use: High Risk (02/08/2024)     Readmission Risk Interventions    02/10/2024   11:03 AM 11/04/2022    2:44 PM  Readmission Risk Prevention Plan  Post Dischage Appt Complete Complete  Medication Screening Complete Complete  Transportation Screening Complete Complete

## 2024-02-10 NOTE — TOC CM/SW Note (Addendum)
 Transition of Care [facility]) - Inpatient Brief Assessment   Patient Details  Name: Sheri Villarreal MRN: [id number] Date of Birth: Aug 26, 1960  Transition of Care [facility]) CM/SW Contact:    Lauraine FORBES Saa, LCSWA Phone Number: 02/10/2024, 9:30 AM   Clinical Narrative:  9:30 AM Per chart review, patient resides at home alone. Patient reported to have insurance but does not have a PCP. Patient does not have SNF/HH/DME history. Patient's preferred pharmacy is [phone] [place]. CSW provided SDOH (social connections) resources. TOC consult was placed for PCP needs and medication assistance. TOC will continue to follow.  Transition of Care Asessment: Insurance and Status: Insurance coverage has been reviewed Patient has primary care physician: No Home environment has been reviewed: Private Residence Prior level of function:: N/A Prior/Current Home Services: No current home services Social Drivers of Health Review: SDOH reviewed interventions complete Readmission risk has been reviewed: Yes (Currently Green 12%) Transition of care needs: transition of care needs identified, TOC will continue to follow

## 2024-02-10 NOTE — Progress Notes (Signed)
° °  Group 10, Grouped object  MATCH MEDICATION ASSISTANCE CARD   Pharmacies please call [phone] for claim processing assistance.   Rx BIN: [id number]   Rx Group: [id number]   Rx PCN: [id number]   Relationship Code: 1   Person Code: 01   Patient ID (MRN): [id number]       Patient Name: Sheri Villarreal    Patient DOB 1960-04-08    Discharge Date:02/10/2024   Expiration Date:02/17/2024 (must be filled within 7 days of discharge)

## 2024-02-10 NOTE — Discharge Instructions (Signed)

## 2024-02-10 NOTE — Plan of Care (Signed)
  Problem: Education: Goal: Understanding of cardiac disease, CV risk reduction, and recovery process will improve Outcome: Adequate for Discharge Goal: Individualized Educational Video(s) Outcome: Adequate for Discharge   Problem: Activity: Goal: Ability to tolerate increased activity will improve Outcome: Adequate for Discharge   Problem: Cardiac: Goal: Ability to achieve and maintain adequate cardiovascular perfusion will improve Outcome: Adequate for Discharge   Problem: Health Behavior/Discharge Planning: Goal: Ability to safely manage health-related needs after discharge will improve Outcome: Adequate for Discharge   Problem: Education: Goal: Knowledge of General Education information will improve Description: Including pain rating scale, medication(s)/side effects and non-pharmacologic comfort measures Outcome: Adequate for Discharge   Problem: Health Behavior/Discharge Planning: Goal: Ability to manage health-related needs will improve Outcome: Adequate for Discharge   Problem: Clinical Measurements: Goal: Ability to maintain clinical measurements within normal limits will improve Outcome: Adequate for Discharge Goal: Will remain free from infection Outcome: Adequate for Discharge Goal: Diagnostic test results will improve Outcome: Adequate for Discharge Goal: Respiratory complications will improve Outcome: Adequate for Discharge Goal: Cardiovascular complication will be avoided Outcome: Adequate for Discharge   Problem: Activity: Goal: Risk for activity intolerance will decrease Outcome: Adequate for Discharge   Problem: Nutrition: Goal: Adequate nutrition will be maintained Outcome: Adequate for Discharge   Problem: Coping: Goal: Level of anxiety will decrease Outcome: Adequate for Discharge   Problem: Elimination: Goal: Will not experience complications related to bowel motility Outcome: Adequate for Discharge Goal: Will not experience complications  related to urinary retention Outcome: Adequate for Discharge   Problem: Pain Managment: Goal: General experience of comfort will improve and/or be controlled Outcome: Adequate for Discharge   Problem: Safety: Goal: Ability to remain free from injury will improve Outcome: Adequate for Discharge   Problem: Skin Integrity: Goal: Risk for impaired skin integrity will decrease Outcome: Adequate for Discharge

## 2024-02-21 ENCOUNTER — Inpatient Hospital Stay: Payer: Self-pay

## 2024-03-10 ENCOUNTER — Ambulatory Visit
Payer: Self-pay | Attending: Student in an Organized Health Care Education/Training Program | Admitting: Student in an Organized Health Care Education/Training Program

## 2024-03-10 NOTE — Progress Notes (Incomplete)
 " Cardiology Office Note:  .   Date:  03/10/2024  ID:  Sheri Villarreal, DOB 01/01/1961, MRN [id number] PCP: Pcp, No  [facility] Providers Cardiologist:  None { Chief Complaint: No chief complaint on file.   History of Present Illness: .    Sheri Villarreal is a 64 y.o. female with a PMH of HTN, HLD, CKD, Graves' disease s/p ablation now with hypothyroidism, GERD and peripheral neuropathy who presents for follow-up.   Pre-Chart Notes: - I met the patient in the hospital when she was admitted for hypertensive urgency and noncardiac chest pain - She was also found to be severely hypothyroid  Discussed the use of AI scribe software for clinical note transcription with the patient, who gave verbal consent to proceed.  History of Present Illness       Studies Reviewed: SABRA    EKG: ***       Cardiac Studies & Procedures   ______________________________________________________________________________________________     ECHOCARDIOGRAM  ECHOCARDIOGRAM COMPLETE 02/09/2024  Narrative ECHOCARDIOGRAM REPORT    Patient Name:   Sheri Villarreal Date of Exam: 02/09/2024 Medical Rec #:  [id number]           Height:       63.0 in Accession #:    [id number]          Weight:       175.0 lb Date of Birth:  1960/09/11          BSA:          1.827 m Patient Age:    63 years            BP:           195/100 mmHg Patient Gender: F                   HR:           60 bpm. Exam Location:  Inpatient  Procedure: 2D Echo, Cardiac Doppler, Color Doppler and Intracardiac Opacification Agent (Both Spectral and Color Flow Doppler were utilized during procedure).  Indications:    Chest Pain  History:        Patient has prior history of Echocardiogram examinations, most recent 01/25/2018. Signs/Symptoms:Chest Pain, Fatigue and Shortness of Breath; Risk Factors:Hypertension and Current Smoker.  Sonographer:    Juliene Rucks Referring Phys: [id number] RAVI  PAHWANI   Sonographer Comments: Patient is obese. Image acquisition challenging due to respiratory motion. IMPRESSIONS   1. Left ventricular ejection fraction, by estimation, is 65 to 70%. The left ventricle has normal function. The left ventricle has no regional wall motion abnormalities. There is mild asymmetric left ventricular hypertrophy of the basal-septal segment. Left ventricular diastolic parameters are consistent with Grade I diastolic dysfunction (impaired relaxation). 2. Right ventricular systolic function is normal. The right ventricular size is normal. 3. A small pericardial effusion is present. The pericardial effusion is surrounding the apex and localized near the right atrium. There is no evidence of cardiac tamponade. 4. The mitral valve is normal in structure. No evidence of mitral valve regurgitation. No evidence of mitral stenosis. 5. The aortic valve is normal in structure. There is mild calcification of the aortic valve. Aortic valve regurgitation is not visualized. Aortic valve sclerosis/calcification is present, without any evidence of aortic stenosis. 6. The inferior vena cava is dilated in size with >50% respiratory variability, suggesting right atrial pressure of 8 mmHg.  Conclusion(s)/Recommendation(s): Technically limited study due to poor sound wave transmission.  FINDINGS  Left Ventricle: Left ventricular ejection fraction, by estimation, is 65 to 70%. The left ventricle has normal function. The left ventricle has no regional wall motion abnormalities. Definity  contrast agent was given IV to delineate the left ventricular endocardial borders. The left ventricular internal cavity size was normal in size. There is mild asymmetric left ventricular hypertrophy of the basal-septal segment. Left ventricular diastolic parameters are consistent with Grade I diastolic dysfunction (impaired relaxation).  Right Ventricle: The right ventricular size is normal. No increase in  right ventricular wall thickness. Right ventricular systolic function is normal.  Left Atrium: Left atrial size was normal in size.  Right Atrium: Right atrial size was normal in size.  Pericardium: A small pericardial effusion is present. The pericardial effusion is surrounding the apex and localized near the right atrium. There is no evidence of cardiac tamponade.  Mitral Valve: The mitral valve is normal in structure. No evidence of mitral valve regurgitation. No evidence of mitral valve stenosis.  Tricuspid Valve: The tricuspid valve is normal in structure. Tricuspid valve regurgitation is trivial. No evidence of tricuspid stenosis.  Aortic Valve: The aortic valve is normal in structure. There is mild calcification of the aortic valve. Aortic valve regurgitation is not visualized. Aortic valve sclerosis/calcification is present, without any evidence of aortic stenosis.  Pulmonic Valve: The pulmonic valve was normal in structure. Pulmonic valve regurgitation is not visualized. No evidence of pulmonic stenosis.  Aorta: The aortic root is normal in size and structure.  Venous: The inferior vena cava is dilated in size with greater than 50% respiratory variability, suggesting right atrial pressure of 8 mmHg.  IAS/Shunts: No atrial level shunt detected by color flow Doppler.   LEFT VENTRICLE PLAX 2D LVIDd:         5.40 cm      Diastology LVIDs:         3.70 cm      LV e' medial:    2.63 cm/s LV PW:         0.90 cm      LV E/e' medial:  18.9 LV IVS:        1.30 cm      LV e' lateral:   3.50 cm/s LVOT diam:     1.60 cm      LV E/e' lateral: 14.2 LV SV:         41 LV SV Index:   22 LVOT Area:     2.01 cm  LV Volumes (MOD) LV vol d, MOD A2C: 150.0 ml LV vol d, MOD A4C: 138.0 ml LV vol s, MOD A2C: 90.3 ml LV vol s, MOD A4C: 85.3 ml LV SV MOD A2C:     59.7 ml LV SV MOD A4C:     138.0 ml LV SV MOD BP:      56.5 ml  RIGHT VENTRICLE             IVC RV Basal diam:  3.20 cm     IVC  diam: 2.10 cm RV Mid diam:    3.10 cm RV S prime:     14.00 cm/s TAPSE (M-mode): 1.3 cm  LEFT ATRIUM           Index        RIGHT ATRIUM          Index LA diam:      2.40 cm 1.31 cm/m   RA Area:     9.21 cm LA Vol (A4C): 58.6 ml 32.08 ml/m  RA  Volume:   20.70 ml 11.33 ml/m AORTIC VALVE LVOT Vmax:   102.00 cm/s LVOT Vmean:  66.000 cm/s LVOT VTI:    0.203 m  AORTA Ao Root diam: 3.10 cm  MITRAL VALVE MV Area (PHT): 3.17 cm    SHUNTS MV Decel Time: 239 msec    Systemic VTI:  0.20 m MV E velocity: 49.70 cm/s  Systemic Diam: 1.60 cm MV A velocity: 73.70 cm/s MV E/A ratio:  0.67  Toribio Fuel MD Electronically signed by Toribio Fuel MD Signature Date/Time: 02/09/2024/12:53:15 PM    Final          ______________________________________________________________________________________________      Results   Risk Assessment/Calculations:    {Does this patient have ATRIAL FIBRILLATION?:[id number]} No BP recorded.  {Refresh Note OR Click here to enter BP  :1}***        Physical Exam:    VS:  There were no vitals taken for this visit. ***    Wt Readings from Last 3 Encounters:  02/09/24 172 lb 6.4 oz (78.2 kg)  11/03/22 173 lb (78.5 kg)  09/22/22 174 lb (78.9 kg)     GEN: Well nourished, well developed, in no acute distress NECK: No JVD; No carotid bruits CARDIAC: ***RRR, no murmurs, rubs, gallops RESPIRATORY:  Clear to auscultation without rales, wheezing or rhonchi  ABDOMEN: Soft, non-tender, non-distended, normal bowel sounds EXTREMITIES:  Warm and well perfused, no edema; No deformity, 2+ radial pulses PSYCH: Normal mood and affect   ASSESSMENT AND PLAN: .    #HTN  #HLD  #    {Are you ordering a CV Procedure (e.g. stress test, cath, DCCV, TEE, etc)?   Press F2        :[id number]}   This note was written with the assistance of a dictation microphone or AI dictation software. Please excuse any typos or grammatical errors.    Signed, Georganna Archer, MD  03/10/2024 2:52 PM    [facility] "
